# Patient Record
Sex: Female | Born: 1995 | Race: Black or African American | Hispanic: No | Marital: Single | Smoking: Former smoker
Health system: Southern US, Community
[De-identification: ages and names within clinical notes are randomized; demographics above are authoritative.]

## PROBLEM LIST (undated history)

## (undated) ENCOUNTER — Inpatient Hospital Stay: Payer: Self-pay

## (undated) ENCOUNTER — Inpatient Hospital Stay (HOSPITAL_COMMUNITY): Payer: Self-pay

## (undated) DIAGNOSIS — D649 Anemia, unspecified: Secondary | ICD-10-CM

## (undated) DIAGNOSIS — Z8659 Personal history of other mental and behavioral disorders: Secondary | ICD-10-CM

## (undated) DIAGNOSIS — Z87440 Personal history of urinary (tract) infections: Secondary | ICD-10-CM

## (undated) DIAGNOSIS — F419 Anxiety disorder, unspecified: Secondary | ICD-10-CM

## (undated) DIAGNOSIS — Z789 Other specified health status: Secondary | ICD-10-CM

## (undated) DIAGNOSIS — F32A Depression, unspecified: Secondary | ICD-10-CM

## (undated) DIAGNOSIS — Z862 Personal history of diseases of the blood and blood-forming organs and certain disorders involving the immune mechanism: Secondary | ICD-10-CM

## (undated) DIAGNOSIS — K469 Unspecified abdominal hernia without obstruction or gangrene: Secondary | ICD-10-CM

## (undated) HISTORY — DX: Personal history of urinary (tract) infections: Z87.440

## (undated) HISTORY — PX: OTHER SURGICAL HISTORY: SHX169

## (undated) HISTORY — DX: Personal history of diseases of the blood and blood-forming organs and certain disorders involving the immune mechanism: Z86.2

## (undated) HISTORY — DX: Personal history of other mental and behavioral disorders: Z86.59

## (undated) HISTORY — PX: WISDOM TOOTH EXTRACTION: SHX21

## (undated) HISTORY — DX: Unspecified abdominal hernia without obstruction or gangrene: K46.9

## (undated) NOTE — *Deleted (*Deleted)
Consult Note   SERVICE: Gynecology ***  Patient Name: Andrea Weiss Patient MRN:   [id number]  CC: ***  HPI: Andrea Weiss is a 31 y.o. [id number] with ***   Review of Systems: positives in bold GEN:   fevers, chills, weight changes, appetite changes, fatigue, night sweats HEENT:  HA, vision changes, hearing loss, congestion, rhinorrhea, sinus pressure, dysphagia CV:   CP, palpitations PULM:  SOB, cough GI:  abd pain, N/V/D/C GU:  dysuria, urgency, frequency MSK:  arthralgias, myalgias, back pain, swelling SKIN:  rashes, color changes, pallor NEURO:  numbness, weakness, tingling, seizures, dizziness, tremors PSYCH:  depression, anxiety, behavioral problems, confusion  HEME/LYMPH:  easy bruising or bleeding ENDO:  heat/cold intolerance  Past Obstetrical History: OB History    Gravida  3   Para  1   Term  1   Preterm  0   AB  1   Living  1     SAB  1   TAB  0   Ectopic  0   Multiple  0   Live Births  1           Past Gynecologic History: Patient's last menstrual period was 04/27/2020 (exact date). Menstrual frequency Q *** wks lasting *** days requiring *** pads/day,  *** for night time symptoms  Past Medical History: Past Medical History:  Diagnosis Date  . Abdominal hernia   . History of anemia   . History of pica   . History of urinary tract infection   . Medical history non-contributory     Past Surgical History:   Past Surgical History:  Procedure Laterality Date  . Nexplanon insertion    . WISDOM TOOTH EXTRACTION Bilateral     Family History:  family history is not on file.  Social History:  Social History   Socioeconomic History  . Marital status: Single    Spouse name: Not on file  . Number of children: Not on file  . Years of education: Not on file  . Highest education level: Not on file  Occupational History  . Not on file  Tobacco Use  . Smoking status: Former Smoker    Packs/day: 0.25    Types:  Cigarettes  . Smokeless tobacco: Never Used  Vaping Use  . Vaping Use: Former  . Quit date: 12/28/2019  Substance and Sexual Activity  . Alcohol use: Not Currently    Comment: quit when she found out she was pregnant, was occassioanl  . Drug use: Not Currently    Types: Marijuana    Comment: quit when she found out she was pregnant  . Sexual activity: Yes    Birth control/protection: None  Other Topics Concern  . Not on file  Social History Narrative  . Not on file   Social Determinants of Health   Financial Resource Strain:   . Difficulty of Paying Living Expenses: Not on file  Food Insecurity:   . Worried About Programme researcher, broadcasting/film/video in the Last Year: Not on file  . Ran Out of Food in the Last Year: Not on file  Transportation Needs:   . Lack of Transportation (Medical): Not on file  . Lack of Transportation (Non-Medical): Not on file  Physical Activity:   . Days of Exercise per Week: Not on file  . Minutes of Exercise per Session: Not on file  Stress:   . Feeling of Stress : Not on file  Social Connections:   . Frequency of Communication with  Friends and Family: Not on file  . Frequency of Social Gatherings with Friends and Family: Not on file  . Attends Religious Services: Not on file  . Active Member of Clubs or Organizations: Not on file  . Attends Banker Meetings: Not on file  . Marital Status: Not on file  Intimate Partner Violence: Not At Risk  . Fear of Current or Ex-Partner: No  . Emotionally Abused: No  . Physically Abused: No  . Sexually Abused: No    Home Medications:  Medications reconciled in EPIC  No current facility-administered medications on file prior to encounter.   Current Outpatient Medications on File Prior to Encounter  Medication Sig Dispense Refill  . acetaminophen (TYLENOL) 500 MG tablet Take 1,000 mg by mouth every 6 (six) hours as needed for mild pain or headache.    . cetirizine (ZYRTEC) 10 MG tablet Take 1 tablet (10 mg  total) by mouth daily. (Patient not taking: Reported on 08/07/2019) 14 tablet 2  . HYDROcodone-acetaminophen (NORCO) 5-325 MG tablet Take 1 tablet by mouth every 4 (four) hours as needed for moderate pain. (Patient not taking: Reported on 06/11/2020) 6 tablet 0  . hydrocortisone cream 0.5 % Apply 1 application topically 2 (two) times daily. (Patient not taking: Reported on 03/14/2018) 30 g 0  . ibuprofen (ADVIL,MOTRIN) 600 MG tablet Take 1 tablet (600 mg total) by mouth every 6 (six) hours. (Patient not taking: Reported on 12/27/2017) 30 tablet 0  . Prenatal Vit-Fe Fumarate-FA (MULTIVITAMIN-PRENATAL) 27-0.8 MG TABS tablet Take 2 tablets by mouth daily at 12 noon. 2 gummies per day      Allergies:  Allergies  Allergen Reactions  . Mango Flavor Hives and Swelling    Allergy to mangos    Physical Exam:  Temp:  [98.8 F (37.1 C)] 98.8 F (37.1 C) (10/27 1244) Pulse Rate:  [83] 83 (10/27 1400) Resp:  [22-26] 22 (10/27 1400) BP: (125-126)/(61-69) 126/69 (10/27 1400) SpO2:  [100 %] 100 % (10/27 1400) Weight:  [54.4 kg] 54.4 kg (10/27 1244)   General Appearance:  Well developed, well nourished, no acute distress, alert and oriented, cooperative and appears stated age HEENT:  Normocephalic atraumatic, extraocular movements intact, moist mucous membranes, neck supple with midline trachea and thyroid without masses Cardiovascular:  Normal S1/S2, regular rate and rhythm, no murmurs, 2+ distal pulses Pulmonary:  clear to auscultation, no wheezes, rales or rhonchi, symmetric air entry, good air exchange Abdomen:  Bowel sounds present, soft, nontender, nondistended, no abnormal masses or organomegaly, no epigastric pain Back: inspection of back is normal Extremities:  extremities normal, no tenderness, atraumatic, no cyanosis or edema Skin:  normal coloration and turgor, no rashes, no suspicious skin lesions noted  Neurologic:  Cranial nerves 2-12 grossly intact, grossly equal strength and muscle tone,  normal speech, no focal findings or movement disorder noted. Psychiatric:  Normal mood and affect, appropriate, no AH/VH Pelvic:  NEFG, no vulvar masses or lesions, normal vaginal mucosa, no vaginal bleeding or discharge, cervix without lesions or erythema, ***uterus, no adnexal masses appreciated, *** no palpable nodularity on rectovaginal exam, no pelvic organ prolapse,    Labs/Studies:   CBC and Coags:  Lab Results  Component Value Date   WBC 7.6 09/16/2020   NEUTOPHILPCT 62 09/16/2020   EOSPCT 2 09/16/2020   BASOPCT 0 09/16/2020   LYMPHOPCT 29 09/16/2020   HGB 11.3 (L) 09/16/2020   HCT 34.5 (L) 09/16/2020   MCV 83.7 09/16/2020   PLT 147 (L) 09/16/2020  INR 0.9 09/16/2020   CMP:  Lab Results  Component Value Date   NA 135 09/16/2020   K 3.6 09/16/2020   CL 103 09/16/2020   CO2 23 09/16/2020   BUN 8 09/16/2020   CREATININE 0.35 (L) 09/16/2020   CREATININE 0.56 03/28/2020   CREATININE 0.60 06/04/2015   PROT 6.9 09/16/2020   BILITOT 0.8 09/16/2020   ALT 12 09/16/2020   AST 21 09/16/2020   ALKPHOS 47 09/16/2020   Other Labs: ***  TVUS:  *** Other Imaging: US OB Limited  Result Date: 09/16/2020 CLINICAL DATA:  Lower abdominal pain for 2 days EXAM: LIMITED OBSTETRIC ULTRASOUND FINDINGS: Number of Fetuses: 1 Heart Rate:  145 bpm Movement: Yes Presentation: Cephalic Placental Location: Anterior Previa: No Amniotic Fluid (Subjective):  Within normal limits. BPD: 4.3 cm 19 w  1 d MATERNAL FINDINGS: Cervix:  3.1 cm length.  Appears closed. Uterus/Adnexae: No abnormality visualized. IMPRESSION: Single live intrauterine gestation in cephalic presentation measuring 19 weeks 1 day by BPD. This exam is performed on an emergent basis and does not comprehensively evaluate fetal size, dating, or anatomy; follow-up complete OB US should be considered if further fetal assessment is warranted. Electronically Signed   By: Duanne Guess D.O.   On: 09/16/2020 14:00     Assessment /  Plan:   Andrea Weiss is a 21 y.o. G3P1011 who presents with ***  1. ***   Thank you for the opportunity to be involved with this patient's care.

---

## 2015-06-04 ENCOUNTER — Emergency Department (HOSPITAL_COMMUNITY): Payer: Self-pay

## 2015-06-04 ENCOUNTER — Encounter (HOSPITAL_COMMUNITY): Payer: Self-pay | Admitting: Neurology

## 2015-06-04 ENCOUNTER — Emergency Department (HOSPITAL_COMMUNITY)
Admission: EM | Admit: 2015-06-04 | Discharge: 2015-06-04 | Disposition: A | Discharge status: Home / Self Care | Payer: Self-pay | Attending: Emergency Medicine | Admitting: Emergency Medicine

## 2015-06-04 DIAGNOSIS — R110 Nausea: Secondary | ICD-10-CM | POA: Insufficient documentation

## 2015-06-04 DIAGNOSIS — R0789 Other chest pain: Secondary | ICD-10-CM

## 2015-06-04 DIAGNOSIS — R05 Cough: Secondary | ICD-10-CM | POA: Insufficient documentation

## 2015-06-04 DIAGNOSIS — Z720 Tobacco use: Secondary | ICD-10-CM | POA: Insufficient documentation

## 2015-06-04 DIAGNOSIS — R079 Chest pain, unspecified: Secondary | ICD-10-CM | POA: Insufficient documentation

## 2015-06-04 LAB — I-STAT BETA HCG BLOOD, ED (MC, WL, AP ONLY)

## 2015-06-04 LAB — I-STAT CHEM 8, ED
BUN: 13 mg/dL (ref 6–20)
CHLORIDE: 99 mmol/L — AB (ref 101–111)
Calcium, Ion: 1.2 mmol/L (ref 1.12–1.23)
Creatinine, Ser: 0.6 mg/dL (ref 0.44–1.00)
GLUCOSE: 91 mg/dL (ref 65–99)
HEMATOCRIT: 41 % (ref 36.0–46.0)
Hemoglobin: 13.9 g/dL (ref 12.0–15.0)
POTASSIUM: 3.7 mmol/L (ref 3.5–5.1)
Sodium: 137 mmol/L (ref 135–145)
TCO2: 22 mmol/L (ref 0–100)

## 2015-06-04 MED ORDER — AEROCHAMBER PLUS W/MASK MISC
1.0000 | Freq: Once | Status: AC
  Administered 2015-06-04: 1
  Filled 2015-06-04: qty 1

## 2015-06-04 MED ORDER — IPRATROPIUM-ALBUTEROL 0.5-2.5 (3) MG/3ML IN SOLN
3.0000 mL | RESPIRATORY_TRACT | Status: DC
  Administered 2015-06-04: 3 mL via RESPIRATORY_TRACT
  Filled 2015-06-04: qty 3

## 2015-06-04 MED ORDER — ALBUTEROL SULFATE HFA 108 (90 BASE) MCG/ACT IN AERS
2.0000 | INHALATION_SPRAY | Freq: Once | RESPIRATORY_TRACT | Status: AC
  Administered 2015-06-04: 2 via RESPIRATORY_TRACT
  Filled 2015-06-04: qty 6.7

## 2015-06-04 NOTE — ED Provider Notes (Signed)
CSN: [id number]     Arrival date & time 06/04/15  1358 History  This chart was scribed for Joycie Peek, PA-C, working with Toy Cookey, MD by Chestine Spore, ED Scribe. The patient was seen in room TR07C/TR07C at 4:24 PM.    Chief Complaint  Patient presents with  . Chest Pain      The history is provided by the patient. No language interpreter was used.    HPI Comments: Andrea Weiss is a 19 y.o. female who presents to the Emergency Department complaining of CP onset 1 week. Pt denies a cardiac hx blood clot, asthma, recent travel surgeries or taking birth control. Pt notes that nothing seems to make the pain better. Pt reports that her CP happens intermittently at work only. Pt has chest tightness that has ben occurring for 10.5 hours today that she rates as 7/10. She states that she is having associated symptoms of cough. She denies vomiting, abdominal pain, fever, hemoptysis, SOB, vomiting, nausea, leg swelling, and any other symptoms.   History reviewed. No pertinent past medical history. History reviewed. No pertinent past surgical history. No family history on file. History  Substance Use Topics  . Smoking status: Current Every Day Smoker  . Smokeless tobacco: Not on file  . Alcohol Use: No   OB History    No data available     Review of Systems  Respiratory: Positive for cough. Negative for shortness of breath.   Cardiovascular: Positive for chest pain. Negative for leg swelling.  Gastrointestinal: Positive for nausea. Negative for vomiting and abdominal pain.  All other systems reviewed and are negative.     Allergies  Review of patient's allergies indicates no known allergies.  Home Medications   Prior to Admission medications   Not on File   BP 112/67 mmHg  Pulse 78  Temp(Src) 98.2 F (36.8 C) (Oral)  Resp 19  SpO2 100%  LMP 04/22/2015 Physical Exam  Constitutional: She is oriented to person, place, and time. She appears well-developed and  well-nourished. No distress.  HENT:  Head: Normocephalic and atraumatic.  Eyes: EOM are normal.  Neck: Neck supple. No tracheal deviation present.  Cardiovascular: Normal rate, regular rhythm and normal heart sounds.  Exam reveals no gallop and no friction rub.   No murmur heard. Pulmonary/Chest: Effort normal and breath sounds normal. No respiratory distress. She has no wheezes. She has no rales.  Abdominal: Soft. There is no tenderness.  Musculoskeletal: Normal range of motion.  Neurological: She is alert and oriented to person, place, and time.  Skin: Skin is warm and dry.  Psychiatric: She has a normal mood and affect. Her behavior is normal.  Nursing note and vitals reviewed.   ED Course  Procedures (including critical care time) DIAGNOSTIC STUDIES: Oxygen Saturation is 99% on RA, nl by my interpretation.    COORDINATION OF CARE: 4:25 PM-Discussed treatment plan with pt at bedside and pt agreed to plan.   Labs Review Labs Reviewed  I-STAT CHEM 8, ED - Abnormal; Notable for the following:    Chloride 99 (*)    All other components within normal limits  I-STAT BETA HCG BLOOD, ED ([facility], [facility], [facility] ONLY)    Imaging Review Dg Chest 2 View  06/04/2015   CLINICAL DATA:  Chest pain  EXAM: CHEST  2 VIEW  COMPARISON:  None.  FINDINGS: The heart size and mediastinal contours are within normal limits. Both lungs are clear. The visualized skeletal structures are unremarkable.  IMPRESSION: No active  cardiopulmonary disease.   Electronically Signed   By: Natasha MeadLiviu  Pop M.D.   On: 06/04/2015 14:58     EKG Interpretation None      ED ECG REPORT   Date: 06/04/2015  Rate: 91  Rhythm: normal sinus rhythm  QRS Axis: normal  Intervals: normal  ST/T Wave abnormalities: normal  Conduction Disutrbances:none  Narrative Interpretation:   Old EKG Reviewed: none available  I have personally reviewed the EKG tracing and agree with the computerized printout as noted.  Meds given in  ED:  Medications  ipratropium-albuterol (DUONEB) 0.5-2.5 (3) MG/3ML nebulizer solution 3 mL (3 mLs Nebulization Given 06/04/15 1647)  aerochamber plus with mask device 1 each (1 each Other Given 06/04/15 1749)  albuterol (PROVENTIL HFA;VENTOLIN HFA) 108 (90 BASE) MCG/ACT inhaler 2 puff (2 puffs Inhalation Given 06/04/15 1749)    There are no discharge medications for this patient.  Filed Vitals:   06/04/15 1418 06/04/15 1649 06/04/15 1751  BP: 109/66 102/65 112/67  Pulse: 94 73 78  Temp: 99 F (37.2 C) 98.2 F (36.8 C)   TempSrc: Oral Oral   Resp: 14 18 19   SpO2: 99% 100% 100%    MDM  Vitals stable - WNL -afebrile Pt resting comfortably in ED. reports she feels much better after DuoNeb. PE--Lung exam normal. Cardiac auscultation reveals no murmurs rubs or gallops. Grossly Benign Physical Exam Labwork: IEKG reassuring.  Labs otherwise noncontributory Imaging: CXR shows no acute cardiopulmonary pathology. I personally reviewed the imaging and agree with the results as interpreted by the radiologist.  DDX: Patient with chest discomfort likely due to bronchospasm as this only happens when she is at work around environmental irritants. Clinical picture and exam today not consistent with ACS/dissection.  No evidence of spontaneous pneumothorax, esophageal rupture or other mediastinitis. PERC negative, doubt PE. No evidence of myocarditis, endocarditis, pericarditis.  Will DC with albuterol inhaler with AeroChamber. I discussed all relevant lab findings and imaging results with pt and they verbalized understanding. Discussed f/u with PCP within 48 hrs and return precautions, pt very amenable to plan.   Final diagnoses:  Chest discomfort  I personally performed the services described in this documentation, which was scribed in my presence. The recorded information has been reviewed and is accurate.    Joycie PeekBenjamin Bernardo Brayman, PA-C 06/04/15 [phone]  Toy CookeyMegan Docherty, MD 06/06/15 [phone]

## 2015-06-04 NOTE — Discharge Instructions (Signed)
You were evaluated for your chest pain in the ED. There does not appear to be emergent cause for her symptoms at this time. Her exam, labs, EKG and chest x-ray were all reassuring. Please use your inhaler and spacer as directed. Follow-up with primary care/[place] and wellness for further evaluation and management of your symptoms. Return to ED for worsening symptoms.

## 2015-06-04 NOTE — ED Notes (Signed)
Pt c/o cp for 1 week, feels tight in chest. Denies vomiting. Denies cardiac hx. Pt is a x 4. Skin warm and dry.

## 2015-07-27 ENCOUNTER — Encounter (HOSPITAL_COMMUNITY): Payer: Self-pay | Admitting: Vascular Surgery

## 2015-07-27 ENCOUNTER — Emergency Department (HOSPITAL_COMMUNITY)
Admission: EM | Admit: 2015-07-27 | Discharge: 2015-07-27 | Disposition: A | Discharge status: Home / Self Care | Payer: Self-pay | Attending: Emergency Medicine | Admitting: Emergency Medicine

## 2015-07-27 ENCOUNTER — Emergency Department (HOSPITAL_COMMUNITY): Payer: Self-pay

## 2015-07-27 DIAGNOSIS — Z720 Tobacco use: Secondary | ICD-10-CM | POA: Insufficient documentation

## 2015-07-27 DIAGNOSIS — J069 Acute upper respiratory infection, unspecified: Secondary | ICD-10-CM | POA: Insufficient documentation

## 2015-07-27 MED ORDER — PSEUDOEPHEDRINE HCL ER 120 MG PO TB12
120.0000 mg | ORAL_TABLET | ORAL | Status: AC
  Administered 2015-07-27: 120 mg via ORAL
  Filled 2015-07-27: qty 1

## 2015-07-27 MED ORDER — IBUPROFEN 400 MG PO TABS
800.0000 mg | ORAL_TABLET | Freq: Once | ORAL | Status: AC
  Administered 2015-07-27: 800 mg via ORAL
  Filled 2015-07-27: qty 4

## 2015-07-27 MED ORDER — HYDROCODONE-HOMATROPINE 5-1.5 MG/5ML PO SYRP: 5.0000 mL | ORAL_SOLUTION | Freq: Four times a day (QID) | ORAL | Status: DC | PRN

## 2015-07-27 NOTE — ED Notes (Signed)
Pt reports to the ED for eval of HA, nasal congestion and drainage, and productive yellow/blood streaked cough. She describes her nasal drainage as the same. Symptoms began yesterday. Pt also reports chills, unsure fever. Pt denies any hx of asthma. Pt A&Ox4, resp e/u, and skin warm and dry.

## 2015-07-27 NOTE — Discharge Instructions (Signed)
Read the information below.  Use the prescribed medication as directed.  Please discuss all new medications with your pharmacist.  You may return to the Emergency Department at any time for worsening condition or any new symptoms that concern you.  If you develop high fevers that do not resolve with tylenol or ibuprofen, you have difficulty swallowing or breathing, or you are unable to tolerate fluids by mouth, return to the ER for a recheck.    ° ° °Upper Respiratory Infection, Adult °An upper respiratory infection (URI) is also sometimes known as the common cold. The upper respiratory tract includes the nose, sinuses, throat, trachea, and bronchi. Bronchi are the airways leading to the lungs. Most people improve within 1 week, but symptoms can last up to 2 weeks. A residual cough may last even longer.  °CAUSES °Many different viruses can infect the tissues lining the upper respiratory tract. The tissues become irritated and inflamed and often become very moist. Mucus production is also common. A cold is contagious. You can easily spread the virus to others by oral contact. This includes kissing, sharing a glass, coughing, or sneezing. Touching your mouth or nose and then touching a surface, which is then touched by another person, can also spread the virus. °SYMPTOMS  °Symptoms typically develop 1 to 3 days after you come in contact with a cold virus. Symptoms vary from person to person. They may include: °· Runny nose. °· Sneezing. °· Nasal congestion. °· Sinus irritation. °· Sore throat. °· Loss of voice (laryngitis). °· Cough. °· Fatigue. °· Muscle aches. °· Loss of appetite. °· Headache. °· Low-grade fever. °DIAGNOSIS  °You might diagnose your own cold based on familiar symptoms, since most people get a cold 2 to 3 times a year. Your caregiver can confirm this based on your exam. Most importantly, your caregiver can check that your symptoms are not due to another disease such as strep throat, sinusitis,  pneumonia, asthma, or epiglottitis. Blood tests, throat tests, and X-rays are not necessary to diagnose a common cold, but they may sometimes be helpful in excluding other more serious diseases. Your caregiver will decide if any further tests are required. °RISKS AND COMPLICATIONS  °You may be at risk for a more severe case of the common cold if you smoke cigarettes, have chronic heart disease (such as heart failure) or lung disease (such as asthma), or if you have a weakened immune system. The very young and very old are also at risk for more serious infections. Bacterial sinusitis, middle ear infections, and bacterial pneumonia can complicate the common cold. The common cold can worsen asthma and chronic obstructive pulmonary disease (COPD). Sometimes, these complications can require emergency medical care and may be life-threatening. °PREVENTION  °The best way to protect against getting a cold is to practice good hygiene. Avoid oral or hand contact with people with cold symptoms. Wash your hands often if contact occurs. There is no clear evidence that vitamin C, vitamin E, echinacea, or exercise reduces the chance of developing a cold. However, it is always recommended to get plenty of rest and practice good nutrition. °TREATMENT  °Treatment is directed at relieving symptoms. There is no cure. Antibiotics are not effective, because the infection is caused by a virus, not by bacteria. Treatment may include: °· Increased fluid intake. Sports drinks offer valuable electrolytes, sugars, and fluids. °· Breathing heated mist or steam (vaporizer or shower). °· Eating chicken soup or other clear broths, and maintaining good nutrition. °· Getting plenty   of rest. °· Using gargles or lozenges for comfort. °· Controlling fevers with ibuprofen or acetaminophen as directed by your caregiver. °· Increasing usage of your inhaler if you have asthma. °Zinc gel and zinc lozenges, taken in the first 24 hours of the common cold, can  shorten the duration and lessen the severity of symptoms. Pain medicines may help with fever, muscle aches, and throat pain. A variety of non-prescription medicines are available to treat congestion and runny nose. Your caregiver can make recommendations and may suggest nasal or lung inhalers for other symptoms.  °HOME CARE INSTRUCTIONS  °· Only take over-the-counter or prescription medicines for pain, discomfort, or fever as directed by your caregiver. °· Use a warm mist humidifier or inhale steam from a shower to increase air moisture. This may keep secretions moist and make it easier to breathe. °· Drink enough water and fluids to keep your urine clear or pale yellow. °· Rest as needed. °· Return to work when your temperature has returned to normal or as your caregiver advises. You may need to stay home longer to avoid infecting others. You can also use a face mask and careful hand washing to prevent spread of the virus. °SEEK MEDICAL CARE IF:  °· After the first few days, you feel you are getting worse rather than better. °· You need your caregiver's advice about medicines to control symptoms. °· You develop chills, worsening shortness of breath, or brown or red sputum. These may be signs of pneumonia. °· You develop yellow or brown nasal discharge or pain in the face, especially when you bend forward. These may be signs of sinusitis. °· You develop a fever, swollen neck glands, pain with swallowing, or white areas in the back of your throat. These may be signs of strep throat. °SEEK IMMEDIATE MEDICAL CARE IF:  °· You have a fever. °· You develop severe or persistent headache, ear pain, sinus pain, or chest pain. °· You develop wheezing, a prolonged cough, cough up blood, or have a change in your usual mucus (if you have chronic lung disease). °· You develop sore muscles or a stiff neck. °Document Released: 05/03/2001 Document Revised: 01/30/2012 Document Reviewed: 02/12/2014 °[employer]® Patient Information ©2015  [employer]. This information is not intended to replace advice given to you by your health care provider. Make sure you discuss any questions you have with your health care provider. ° °

## 2015-07-27 NOTE — ED Notes (Signed)
Patient returned from [facility]. 

## 2015-07-27 NOTE — ED Notes (Signed)
PA at the bedside.

## 2015-07-27 NOTE — ED Notes (Signed)
Patient transported to X-ray 

## 2015-07-27 NOTE — ED Provider Notes (Signed)
CSN: [id number]     Arrival date & time 07/27/15  2116 History  This chart was scribed for non-physician practitioner working [name] PA-C with Lavera Guise, MD by Lyndel Safe, ED Scribe. This patient was seen in room TR03C/TR03C and the patient's care was started at 10:04 PM.  Chief Complaint  Patient presents with  . URI  . Headache   The history is provided by the patient. No language interpreter was used.   HPI Comments:  Andrea Weiss is a 19 y.o. female, with no chronic medical conditions, who presents to the Emergency Department complaining of constant URI symptoms onset 1 day ago that progressively worsened today. The pt reports URI symptoms of a headache, nasal congestion, rhinorrhea that she describes as a mix of mucous with red streaks, a productive cough with yellow and blood streaked sputum, and chills. She additionally reports an episode of posttussive emesis but denies nausea and vomiting.  Pt has taken an OTC cold medication with no relief. She denies SOB, fevers, malaise, abdominal pain, vomiting, diarrhea, any urinary symptoms, abnormal vaginal discharge or chance of pregnancy.   History reviewed. No pertinent past medical history. History reviewed. No pertinent past surgical history. No family history on file. Social History  Substance Use Topics  . Smoking status: Current Every Day Smoker -- 0.50 packs/day    Types: Cigarettes  . Smokeless tobacco: Never Used  . Alcohol Use: No   OB History    No data available     Review of Systems  Constitutional: Positive for chills. Negative for fever.  HENT: Positive for congestion and rhinorrhea.   Respiratory: Positive for cough. Negative for shortness of breath.   Gastrointestinal: Negative for nausea, vomiting, abdominal pain and diarrhea.  Genitourinary: Negative for dysuria, urgency, frequency and vaginal discharge.  Neurological: Positive for headaches.  All other systems reviewed and are  negative.  Allergies  Review of patient's allergies indicates no known allergies.  Home Medications   Prior to Admission medications   Not on File   BP 103/59 mmHg  Pulse 96  Temp(Src) 98.5 F (36.9 C) (Oral)  Resp 16  Wt 100 lb 8 oz (45.587 kg)  SpO2 99%  LMP 07/22/2015 Physical Exam  Constitutional: She appears well-developed and well-nourished. No distress.  HENT:  Head: Normocephalic and atraumatic.  Nose: Mucosal edema and rhinorrhea present. Right sinus exhibits maxillary sinus tenderness. Right sinus exhibits no frontal sinus tenderness. Left sinus exhibits maxillary sinus tenderness. Left sinus exhibits no frontal sinus tenderness.  Mouth/Throat: Uvula is midline. Mucous membranes are not dry. Posterior oropharyngeal erythema present. No oropharyngeal exudate, posterior oropharyngeal edema or tonsillar abscesses.  Eyes: Conjunctivae are normal.  Neck: Normal range of motion. Neck supple.  Cardiovascular: Normal rate and regular rhythm.   Pulmonary/Chest: Effort normal and breath sounds normal. No respiratory distress. She has no wheezes. She has no rales.  Neurological: She is alert.  Skin: She is not diaphoretic.  Nursing note and vitals reviewed.   ED Course  Procedures  DIAGNOSTIC STUDIES: Oxygen Saturation is 99% on RA, normal by my interpretation.    COORDINATION OF CARE: 10:10 PM Discussed treatment plan which includes to order chest Xray with pt. Pt acknowledges and agrees to plan.   Labs Review Labs Reviewed - No data to display  Imaging Review No results found.    EKG Interpretation None      MDM   Final diagnoses:  URI (upper respiratory infection)    Afebrile, nontoxic patient with  constellation of symptoms suggestive of viral syndrome.  No concerning findings on exam.  CXR ordered given blood streaked sputum, though pt is a smoker.   CXR pending at end of shift.  Signed out to [employer], PA-C, who assumes care of patient pending  CXR.  Anticipate d/c home with supportive medications, PCP follow up.     I personally performed the services described in this documentation, which was scribed in my presence. The recorded information has been reviewed and is accurate.    [name], PA-C 07/27/15 2259  [name], PA-C 07/27/15 2259  Lavera Guise, MD 07/28/15 [phone]

## 2016-11-21 NOTE — L&D Delivery Note (Signed)
Operative Delivery Note At 10:59 AM a viable female was delivered via Vaginal, Vacuum Investment banker, operational(Extractor).  Position: Occiput,, Anterior; Station: +4.   Called to room for persistent FHR in 90s with ineffective pushing. Fetal head at perineum. Patient pushed ineffectively with two pushes with no improvement in FHR. Reviewed option for vacuum assisted vaginal delivery to expedite delivery for low fetal heart rate. Risks include, but are not limited to the risks of anesthesia, bleeding, infection, damage to maternal tissues, fetal cephalhematoma.  There is also the risk of inability to effect vaginal delivery of the head, or shoulder dystocia that cannot be resolved by established maneuvers, leading to the need for emergency cesarean section. Patient verbalized consent and Kiwi vacuum applied 2 cm anterior to the posterior fontanelle, equidistant across the sagittal suture. Vacuum pressure was applied and gentle traction applied while patient pushed for approx 10 seconds. Head delivered easily in OA position and pressure from [facility]. No popoffs. No nuchal cord noted. Left shoulder rotated anteriorly to facilitate delivery as shoulders were a tight fit at introitus. Infant delivered easily with moderate tone. No cry, infant bulb suctioned and cord clamping done at approx 45 seconds. Infant delivered to warmer for waiting pediatrics staff, with good cry after cord cut. Apgars 5/9, weight pending. Cord section obtained and cord blood obtained. Placenta delivered spontaneously with pitocin and massage. Fundus firm. Bilateral periurethral abrasions noted requiring no repair. One figure of 8 placed with 3-0 vicryl in right vaginal wall for oozing with good hemostasis noted. EBL 200 mL.  Mom to postpartum.  Baby to Couplet care / Skin to Skin.    Baldemar LenisK. Meryl Davis, M.D. 11/17/2017, 12:11 PM Attending Obstetrician & Gynecologist, [facility], [facility]

## 2017-04-19 ENCOUNTER — Other Ambulatory Visit: Payer: Self-pay | Admitting: Obstetrics & Gynecology

## 2017-04-19 DIAGNOSIS — Z363 Encounter for antenatal screening for malformations: Secondary | ICD-10-CM

## 2017-04-19 LAB — OB RESULTS CONSOLE HEPATITIS B SURFACE ANTIGEN: Hepatitis B Surface Ag: NEGATIVE

## 2017-04-19 LAB — OB RESULTS CONSOLE HIV ANTIBODY (ROUTINE TESTING): HIV: NONREACTIVE

## 2017-04-19 LAB — OB RESULTS CONSOLE RUBELLA ANTIBODY, IGM: RUBELLA: IMMUNE

## 2017-04-19 LAB — OB RESULTS CONSOLE RPR: RPR: NONREACTIVE

## 2017-04-19 LAB — OB RESULTS CONSOLE ABO/RH: RH TYPE: POSITIVE

## 2017-04-21 ENCOUNTER — Other Ambulatory Visit: Payer: Self-pay | Admitting: Obstetrics & Gynecology

## 2017-04-21 ENCOUNTER — Ambulatory Visit (INDEPENDENT_AMBULATORY_CARE_PROVIDER_SITE_OTHER): Payer: Medicaid Other

## 2017-04-21 DIAGNOSIS — Z363 Encounter for antenatal screening for malformations: Secondary | ICD-10-CM

## 2017-04-21 DIAGNOSIS — Z3689 Encounter for other specified antenatal screening: Secondary | ICD-10-CM | POA: Diagnosis not present

## 2017-06-20 ENCOUNTER — Other Ambulatory Visit: Payer: Medicaid Other

## 2017-06-26 ENCOUNTER — Other Ambulatory Visit: Payer: Self-pay | Admitting: Obstetrics & Gynecology

## 2017-06-26 DIAGNOSIS — Z363 Encounter for antenatal screening for malformations: Secondary | ICD-10-CM

## 2017-06-27 ENCOUNTER — Ambulatory Visit (INDEPENDENT_AMBULATORY_CARE_PROVIDER_SITE_OTHER): Payer: Medicaid Other

## 2017-06-27 DIAGNOSIS — Z363 Encounter for antenatal screening for malformations: Secondary | ICD-10-CM | POA: Diagnosis not present

## 2017-07-22 ENCOUNTER — Observation Stay
Admission: EM | Admit: 2017-07-22 | Discharge: 2017-07-22 | Disposition: A | Discharge status: Home / Self Care | Payer: Medicaid Other | Attending: Advanced Practice Midwife | Admitting: Advanced Practice Midwife

## 2017-07-22 ENCOUNTER — Encounter: Payer: Self-pay | Admitting: *Deleted

## 2017-07-22 DIAGNOSIS — Z91018 Allergy to other foods: Secondary | ICD-10-CM | POA: Diagnosis not present

## 2017-07-22 DIAGNOSIS — Z87891 Personal history of nicotine dependence: Secondary | ICD-10-CM | POA: Diagnosis not present

## 2017-07-22 DIAGNOSIS — O26892 Other specified pregnancy related conditions, second trimester: Secondary | ICD-10-CM | POA: Diagnosis not present

## 2017-07-22 DIAGNOSIS — M545 Low back pain: Secondary | ICD-10-CM

## 2017-07-22 DIAGNOSIS — Z3A23 23 weeks gestation of pregnancy: Secondary | ICD-10-CM | POA: Diagnosis not present

## 2017-07-22 HISTORY — DX: Other specified health status: Z78.9

## 2017-07-22 LAB — URINALYSIS, ROUTINE W REFLEX MICROSCOPIC
Bilirubin Urine: NEGATIVE
Glucose, UA: NEGATIVE mg/dL
Hgb urine dipstick: NEGATIVE
Ketones, ur: 5 mg/dL — AB
LEUKOCYTES UA: NEGATIVE
NITRITE: NEGATIVE
PROTEIN: NEGATIVE mg/dL
Specific Gravity, Urine: 1.02 (ref 1.005–1.030)
pH: 6 (ref 5.0–8.0)

## 2017-07-22 NOTE — OB Triage Note (Signed)
Reports losing mucous plug last night around midnight. Denies LOF, bleeding, or vaginal discharge. Elaina HoopsElks, Briant Angelillo S

## 2017-07-22 NOTE — Discharge Summary (Signed)
Physician Final Progress Note  Patient ID: Andrea Weiss MRN: [id number] DOB/AGE: 21/02/1996 21 y.o.  Admit date: 07/22/2017 Admitting provider: Linzie Collinavid James Evans, MD Discharge date: 07/22/2017   Admission Diagnoses: lost mucous plug, low back pain  Discharge Diagnoses:  Active Problems:   Indication for care in labor or delivery IUP at 23 weeks 0 days, with positive fetal heart tones, not in labor, no evidence of fluid at discharge  History of Present Illness: The patient is a 21 y.o. female G1P0 at 594w0d who presents for having a clump of clear mucous come out following her shower last night. She also started having back pain last night that lasts for 30-60 minutes when it occurs. She admits positive fetal movement. She admits having intercourse on Wednesday and today. She denies contractions, other LOF, VB. She denies symptoms of UTI. She does not always drink enough h2o to keep her urine clear to light yellow. She has questions about back pain and what she can do to relieve it. Labor precautions are reviewed with the patient with verbalized understanding.   Past Medical History:  Diagnosis Date  . Medical history non-contributory     Past Surgical History:  Procedure Laterality Date  . WISDOM TOOTH EXTRACTION Bilateral     No current facility-administered medications on file prior to encounter.    Current Outpatient Prescriptions on File Prior to Encounter  Medication Sig Dispense Refill  . HYDROcodone-homatropine (HYCODAN) 5-1.5 MG/5ML syrup Take 5 mLs by mouth every 6 (six) hours as needed for cough. (Patient not taking: Reported on 07/22/2017) 120 mL 0    Allergies  Allergen Reactions  . Mango Flavor Hives and Swelling    Allergy to [employer]    Social History   Social History  . Marital status: Single    Spouse name: N/A  . Number of children: N/A  . Years of education: N/A   Occupational History  . Not on file.   Social History Main Topics  . Smoking status:  Former Smoker    Packs/day: 0.50    Types: Cigarettes    Quit date: 04/19/2017  . Smokeless tobacco: Never Used  . Alcohol use No  . Drug use: Yes    Types: Marijuana  . Sexual activity: Yes    Birth control/ protection: Condom   Other Topics Concern  . Not on file   Social History Narrative  . No narrative on file    Physical Exam: BP 120/74 (BP Location: Left Arm)   Pulse 95   Temp 98.6 F (37 C) (Oral)   Resp 16   Ht 5\' 4"  (1.626 m)   Wt 110 lb (49.9 kg)   LMP 12/30/2016 (Approximate)   BMI 18.88 kg/m   Gen: NAD CV: RRR Pulm: CTAB Pelvic:  Nitrazine negative, no evidence of fluid leaking on exam Toco: irritability noted Fetal Well Being: 154 on doppler Ext: no evidence of DVT  Consults: None  Significant Findings/ Diagnostic Studies: labs:  Results for Andrea Weiss, Andrea Weiss (MRN [id number]) as of 07/22/2017 17:54  Ref. Range 07/22/2017 17:13  Appearance Latest Ref Range: CLEAR  CLEAR (A)  Bilirubin Urine Latest Ref Range: NEGATIVE  NEGATIVE  Color, Urine Latest Ref Range: YELLOW  YELLOW (A)  Glucose Latest Ref Range: NEGATIVE mg/dL NEGATIVE  Hgb urine dipstick Latest Ref Range: NEGATIVE  NEGATIVE  Ketones, ur Latest Ref Range: NEGATIVE mg/dL 5 (A)  Leukocytes, UA Latest Ref Range: NEGATIVE  NEGATIVE  Nitrite Latest Ref Range: NEGATIVE  NEGATIVE  pH Latest  Ref Range: 5.0 - 8.0  6.0  Protein Latest Ref Range: NEGATIVE mg/dL NEGATIVE  Specific Gravity, Urine Latest Ref Range: 1.005 - 1.030  1.020    Procedures: none   Discharge Condition: good  Disposition: 01-Home or Self Care  Diet: Regular diet  Discharge Activity: Activity as tolerated  Discharge Instructions    Discharge activity:  No Restrictions    Complete by:  As directed    Heat/ice, stretching, abdominal support band, bath/shower for back pain   Discharge diet:  No restrictions    Complete by:  As directed    Increase hydration with h2o until urine is clear to light yellow   No sexual activity  restrictions    Complete by:  As directed    Notify physician for a general feeling that "something is not right"    Complete by:  As directed    Notify physician for increase or change in vaginal discharge    Complete by:  As directed    Notify physician for intestinal cramps, with or without diarrhea, sometimes described as "gas pain"    Complete by:  As directed    Notify physician for leaking of fluid    Complete by:  As directed    Notify physician for low, dull backache, unrelieved by heat or Tylenol    Complete by:  As directed    Notify physician for menstrual like cramps    Complete by:  As directed    Notify physician for pelvic pressure    Complete by:  As directed    Notify physician for uterine contractions.  These may be painless and feel like the uterus is tightening or the baby is  "balling up"    Complete by:  As directed    Notify physician for vaginal bleeding    Complete by:  As directed    PRETERM LABOR:  Includes any of the follwing symptoms that occur between 20 - [redacted] weeks gestation.  If these symptoms are not stopped, preterm labor can result in preterm delivery, placing your baby at risk    Complete by:  As directed      Allergies as of 07/22/2017      Reactions   Mango Flavor Hives, Swelling   Allergy to [facility]      Medication List    STOP taking these medications   HYDROcodone-homatropine 5-1.5 MG/5ML syrup Commonly known as:  HYCODAN     TAKE these medications   multivitamin-prenatal 27-0.8 MG Tabs tablet Take 1 tablet by mouth daily at 12 noon.             Follow-up Information    Department, [facility]. Go to.   Why:  regular scheduled prenatal appointment Contact information: [address] [place] [address] [phone]           Total time spent taking care of this patient: 15 minutes  Signed: Tresea Mall, CNM  07/22/2017, 6:02 PM

## 2017-08-31 LAB — OB RESULTS CONSOLE HIV ANTIBODY (ROUTINE TESTING): HIV: NONREACTIVE

## 2017-09-27 ENCOUNTER — Encounter (HOSPITAL_COMMUNITY): Payer: Self-pay | Admitting: Emergency Medicine

## 2017-09-27 ENCOUNTER — Inpatient Hospital Stay (HOSPITAL_COMMUNITY)
Admission: AD | Admit: 2017-09-27 | Discharge: 2017-09-28 | Disposition: A | Discharge status: Home / Self Care | Payer: Medicaid Other | Source: Ambulatory Visit | Attending: Obstetrics and Gynecology | Admitting: Obstetrics and Gynecology

## 2017-09-27 DIAGNOSIS — O4703 False labor before 37 completed weeks of gestation, third trimester: Secondary | ICD-10-CM | POA: Diagnosis present

## 2017-09-27 DIAGNOSIS — Z3A32 32 weeks gestation of pregnancy: Secondary | ICD-10-CM | POA: Insufficient documentation

## 2017-09-27 DIAGNOSIS — O0933 Supervision of pregnancy with insufficient antenatal care, third trimester: Secondary | ICD-10-CM

## 2017-09-27 DIAGNOSIS — Z87891 Personal history of nicotine dependence: Secondary | ICD-10-CM | POA: Insufficient documentation

## 2017-09-27 MED ORDER — LACTATED RINGERS IV BOLUS (SEPSIS)
1000.0000 mL | Freq: Once | INTRAVENOUS | Status: AC
  Administered 2017-09-27: 1000 mL via INTRAVENOUS

## 2017-09-27 NOTE — ED Provider Notes (Signed)
[facility] Provider Note   CSN: [id number] Arrival date & time: 09/27/17  2052     History   Chief Complaint Chief Complaint  Patient presents with  . Contractions    HPI Ky Arther DamesHargrave is a 21 y.o. female.  HPI   Patient is a 21 year old female that reports that she is [redacted] weeks pregnant presenting today for feelings of contractions.  Patient does not appeared very uncomfortable currently.  Patient came to the wrong hospital, and she did [facility].  Patient reports that she has not monitor her symptoms nor does she know how often she is contracting.  She reports that she has not been seen by physician for the last month because [place] from [place] people here so that she was "too far along" to have an appointment here.  Patient says that she is "been passing her mucous plug" last several days.  Past Medical History:  Diagnosis Date  . Medical history non-contributory     Patient Active Problem List   Diagnosis Date Noted  . Indication for care in labor or delivery 07/22/2017    Past Surgical History:  Procedure Laterality Date  . WISDOM TOOTH EXTRACTION Bilateral     OB History    Gravida Para Term Preterm AB Living   1             SAB TAB Ectopic Multiple Live Births                   Home Medications    Prior to Admission medications   Medication Sig Start Date End Date Taking? Authorizing Provider  Prenatal Vit-Fe Fumarate-FA (MULTIVITAMIN-PRENATAL) 27-0.8 MG TABS tablet Take 1 tablet by mouth daily at 12 noon.    [provider]    Family History No family history on file.  Social History Social History   Tobacco Use  . Smoking status: Former Smoker    Packs/day: 0.50    Types: Cigarettes    Last attempt to quit: 04/19/2017    Years since quitting: 0.4  . Smokeless tobacco: Never Used  Substance Use Topics  . Alcohol use: No  . Drug use: Yes      Types: Marijuana     Allergies   Mango flavor   Review of Systems Review of Systems  Constitutional: Negative for activity change.  Respiratory: Negative for shortness of breath.   Cardiovascular: Negative for chest pain.  Gastrointestinal: Negative for abdominal pain.     Physical Exam Updated Vital Signs BP 106/72 (BP Location: Right Arm)   Pulse 90   Temp 98.1 F (36.7 C) (Oral)   Resp 18   Ht 5\' 2"  (1.575 m)   Wt 49.9 kg (110 lb)   LMP 12/30/2016 (Approximate)   SpO2 100%   BMI 20.12 kg/m   Physical Exam  Constitutional: She is oriented to person, place, and time. She appears well-developed and well-nourished.  HENT:  Head: Normocephalic and atraumatic.  Eyes: Right eye exhibits no discharge. Left eye exhibits no discharge.  Cardiovascular: Normal rate, regular rhythm and normal heart sounds.  No murmur heard. Pulmonary/Chest: Effort normal and breath sounds normal. She has no wheezes. She has no rales.  Abdominal: Soft. She exhibits no distension. There is no tenderness.  Gravid abdomen.  Neurological: She is oriented to person, place, and time.  Skin: Skin is warm and dry. She is not diaphoretic.  Psychiatric:  She has a normal mood and affect.  Nursing note and vitals reviewed.    ED Treatments / Results  Labs (all labs ordered are listed, but only abnormal results are displayed) Labs Reviewed - No data to display  EKG  EKG Interpretation None       Radiology No results found.  Procedures Procedures (including critical care time)  Medications Ordered in ED Medications - No data to display   Initial Impression / Assessment and Plan / ED Course  I have reviewed the triage vital signs and the nursing notes.  Pertinent labs & imaging results that were available during my care of the patient were reviewed by me and considered in my medical decision making (see chart for details).     Patient is a 21 year old female that reports that  she is [redacted] weeks pregnant presenting today for feelings of contractions.  Patient does not appeared very uncomfortable currently.  Patient came to the wrong hospital, and she did not know that [facility] needed [facility].  Patient reports that she has not monitor her symptoms nor does she know how often she is contracting.  She reports that she has not been seen by physician for the last month because she moved from [place] the people here so that she was "too far along" to have an appointment here.  Patient says that she is "been passing her mucous plug" last several days.  10:02 PM Reviewing patient's chart appears that she is actually not due until 27 December.  According to the 6-week ultrasound.  Tthis puts her more like 32 weeks rather than 39 weeks.  Patient contracting every 2-6 minutes.  Patient is at 50% effaced closed.  Discussed with Dr. Emelda FearFerguson.  Will transfer to women's MAU given that she is contracting at 32 weeks.  Final Clinical Impressions(s) / ED Diagnoses   Final diagnoses:  None    ED Discharge Orders    None       Abelino DerrickMackuen, Montserrat Shek Lyn, MD 09/27/17 2337

## 2017-09-27 NOTE — ED Triage Notes (Signed)
Pt is [redacted] wks pregnant, reports onset of contractions earlier this evening. Denies vaginal bleeding, DC, leaking of fluids, etc. Pt appears comfortable in bed

## 2017-09-27 NOTE — Progress Notes (Signed)
Pt is a G1P0, who has received limited prenatal care. [facility] although patients states she was told her due date is 10/06/2017. This puts her at 32wk4da to 4738wk5da. SVE closed/50/-3. FHT 135, moderate variability, multiple accelerations and no decelerations (Cat I), contrations 2-6 minutes and mild.

## 2017-09-28 ENCOUNTER — Encounter (HOSPITAL_COMMUNITY): Payer: Self-pay

## 2017-09-28 DIAGNOSIS — O4703 False labor before 37 completed weeks of gestation, third trimester: Secondary | ICD-10-CM | POA: Diagnosis present

## 2017-09-28 DIAGNOSIS — O0933 Supervision of pregnancy with insufficient antenatal care, third trimester: Secondary | ICD-10-CM

## 2017-09-28 LAB — URINALYSIS, ROUTINE W REFLEX MICROSCOPIC
Bilirubin Urine: NEGATIVE
Glucose, UA: NEGATIVE mg/dL
Hgb urine dipstick: NEGATIVE
KETONES UR: NEGATIVE mg/dL
Leukocytes, UA: NEGATIVE
NITRITE: NEGATIVE
PH: 8 (ref 5.0–8.0)
PROTEIN: NEGATIVE mg/dL
Specific Gravity, Urine: 1.002 — ABNORMAL LOW (ref 1.005–1.030)

## 2017-09-28 LAB — WET PREP, GENITAL
Clue Cells Wet Prep HPF POC: NONE SEEN
SPERM: NONE SEEN
TRICH WET PREP: NONE SEEN
Yeast Wet Prep HPF POC: NONE SEEN

## 2017-09-28 MED ORDER — BETAMETHASONE SOD PHOS & ACET 6 (3-3) MG/ML IJ SUSP
12.0000 mg | Freq: Once | INTRAMUSCULAR | Status: AC
  Administered 2017-09-28: 12 mg via INTRAMUSCULAR
  Filled 2017-09-28: qty 2

## 2017-09-28 MED ORDER — LACTATED RINGERS IV BOLUS (SEPSIS)
1000.0000 mL | Freq: Once | INTRAVENOUS | Status: AC
Start: 2017-09-28 — End: 2017-09-28
  Administered 2017-09-28: 1000 mL via INTRAVENOUS

## 2017-09-28 MED ORDER — NIFEDIPINE ER OSMOTIC RELEASE 30 MG PO TB24: 30.0000 mg | ORAL_TABLET | Freq: Every day | ORAL | 0 refills | Status: DC

## 2017-09-28 MED ORDER — BETAMETHASONE SOD PHOS & ACET 6 (3-3) MG/ML IJ SUSP: 12.0000 mg | Freq: Once | INTRAMUSCULAR | Status: DC

## 2017-09-28 MED ORDER — NIFEDIPINE 10 MG PO CAPS
10.0000 mg | ORAL_CAPSULE | ORAL | Status: AC
  Administered 2017-09-28 (×3): 10 mg via ORAL
  Filled 2017-09-28 (×3): qty 1

## 2017-09-28 NOTE — ED Notes (Signed)
[facility] arrival to transport pt [facility], PT NAD at transport

## 2017-09-28 NOTE — MAU Provider Note (Signed)
History  CSN: [id number] Arrival date and time: 09/27/17 2052  First Provider Initiated Contact with Patient 09/28/17 0100      Chief Complaint  Patient presents with  . Contractions    HPI: Andrea Weiss is a 21 y.o. G1P0 with IUP at 839w5d who [facility]. She was noted to be contracting there every 2-6 minutes. She has had limited PNC. She reports feeling mostly intermittent back pain, and cannot really tell contractions. She denies vaginal bleeding or LOF, but reports tha she has been passing her mucus plug for the last 2 days. Reports good fetal movement.   Also denies fevers, chills, malaise, dysuria, hematuria, urinary frequency, nausea, vomiting, diarrhea, RUQ/epigastric pain, dizziness/lighreadhess, or headache.   [facility], but has not seen anyone for the past month [place].    OB History  Gravida Para Term Preterm AB Living  1            SAB TAB Ectopic Multiple Live Births               # Outcome Date GA Lbr Len/2nd Weight Sex Delivery Anes PTL Lv  1 Current              Past Medical History:  Diagnosis Date  . Medical history non-contributory    Past Surgical History:  Procedure Laterality Date  . WISDOM TOOTH EXTRACTION Bilateral    History reviewed. No pertinent family history. Social History   Socioeconomic History  . Marital status: Single    Spouse name: Not on file  . Number of children: Not on file  . Years of education: Not on file  . Highest education level: Not on file  Social Needs  . Financial resource strain: Not on file  . Food insecurity - worry: Not on file  . Food insecurity - inability: Not on file  . Transportation needs - medical: Not on file  . Transportation needs - non-medical: Not on file  Occupational History  . Not on file  Tobacco Use  . Smoking status: Former Smoker    Packs/day: 0.50    Types: Cigarettes    Last attempt to  quit: 04/19/2017    Years since quitting: 0.4  . Smokeless tobacco: Never Used  Substance and Sexual Activity  . Alcohol use: No  . Drug use: Yes    Types: Marijuana  . Sexual activity: Yes    Birth control/protection: Condom  Other Topics Concern  . Not on file  Social History Narrative  . Not on file   Allergies  Allergen Reactions  . Mango Flavor Hives and Swelling    Allergy to mangos    Medications Prior to Admission  Medication Sig Dispense Refill Last Dose  . Prenatal Vit-Fe Fumarate-FA (MULTIVITAMIN-PRENATAL) 27-0.8 MG TABS tablet Take 1 tablet by mouth daily at 12 noon.   09/27/2017 at Unknown time    I have reviewed patient's Past Medical Hx, Surgical Hx, Family Hx, Social Hx, medications and allergies.   Review of Systems: Negative except for what is mentioned in HPI.  Physical Exam   Blood pressure 109/64, pulse 86, temperature 97.9 F (36.6 C), temperature source Oral, resp. rate 18, height 5\' 2"  (1.575 m), weight 110 lb (49.9 kg), last menstrual period 12/30/2016, SpO2 100 %.  Constitutional: Well-developed, well-nourished female in no acute distress.  HENT: [place]/AT, normal oropharynx mucosa. MMM Eyes: normal conjunctivae, no scleral icterus Cardiovascular: normal rate,  regular rhythm Respiratory: normal effort GI: Abd soft, non-tender. FH 32 cm GU: Neg CVAT. Pelvic: NEFG. Normal vaginal mucosa without lesions. Cervix pink, visually closed, without lesion, scant discharge SVE: 1cm/50%/-3 MSK: Extremities nontender, no edema Neurologic: Alert and oriented x 4. Psych: Normal mood and affect Skin: warm and dry   FHT:  Baseline 145 , moderate variability, accelerations present, no decelerations Toco: Contractions: q 2-3 mins  MAU Course/MDM:   Nursing notes and VS reviewed. Patient seen and examined, as noted above.  Reactive NST IVFs and Procardia given for contractions with improvement. Repeat SVE unchanged.   Results reviewed:  Results for orders  placed or performed during the hospital encounter of 09/27/17  Urinalysis, Routine w reflex microscopic  Result Value Ref Range   Color, Urine STRAW (A) YELLOW   APPearance CLEAR CLEAR   Specific Gravity, Urine 1.002 (L) 1.005 - 1.030   pH 8.0 5.0 - 8.0   Glucose, UA NEGATIVE NEGATIVE mg/dL   Hgb urine dipstick NEGATIVE NEGATIVE   Bilirubin Urine NEGATIVE NEGATIVE   Ketones, ur NEGATIVE NEGATIVE mg/dL   Protein, ur NEGATIVE NEGATIVE mg/dL   Nitrite NEGATIVE NEGATIVE   Leukocytes, UA NEGATIVE NEGATIVE    Assessment and Plan  Assessment: 1. Threatened premature labor in third trimester   2. Limited prenatal care in third trimester     Plan: --Discharge home on Procardia XL --BMZ #2 to be given Friday morning  --Labor precautions and fetal kick counts --[facility] pool for PNV (transfer of care).     Marrisa Kimber, Kandra NicolasJulie P, MD 09/28/2017 1:00 AM

## 2017-09-28 NOTE — Discharge Instructions (Signed)

## 2017-09-28 NOTE — MAU Note (Signed)
[facility]. Pt has had limited PNC.

## 2017-09-29 ENCOUNTER — Inpatient Hospital Stay (HOSPITAL_COMMUNITY)
Admission: AD | Admit: 2017-09-29 | Discharge: 2017-09-29 | Disposition: A | Discharge status: Home / Self Care | Payer: Medicaid Other | Source: Ambulatory Visit | Attending: Obstetrics & Gynecology | Admitting: Obstetrics & Gynecology

## 2017-09-29 DIAGNOSIS — Z3A36 36 weeks gestation of pregnancy: Secondary | ICD-10-CM | POA: Insufficient documentation

## 2017-09-29 LAB — GC/CHLAMYDIA PROBE AMP (~~LOC~~) NOT AT ARMC
CHLAMYDIA, DNA PROBE: NEGATIVE
Neisseria Gonorrhea: NEGATIVE

## 2017-09-29 MED ORDER — BETAMETHASONE SOD PHOS & ACET 6 (3-3) MG/ML IJ SUSP
12.0000 mg | Freq: Once | INTRAMUSCULAR | Status: AC
  Administered 2017-09-29: 12 mg via INTRAMUSCULAR
  Filled 2017-09-29: qty 2

## 2017-09-29 NOTE — MAU Note (Signed)
Pt here for second BMZ injection, denies bleeding or pain.

## 2017-09-30 LAB — CULTURE, BETA STREP (GROUP B ONLY)

## 2017-10-11 ENCOUNTER — Telehealth: Payer: Self-pay | Admitting: General Practice

## 2017-10-11 NOTE — Telephone Encounter (Signed)
Called patient and left message on VM in regards to New OB appointment scheduled for 10/18/17 at 2:20pm.  Asked patient to give our office a call back if unable to keep this appointment.

## 2017-10-14 ENCOUNTER — Encounter (HOSPITAL_COMMUNITY): Payer: Self-pay | Admitting: Emergency Medicine

## 2017-10-14 ENCOUNTER — Inpatient Hospital Stay (HOSPITAL_COMMUNITY)
Admission: AD | Admit: 2017-10-14 | Discharge: 2017-10-14 | Disposition: A | Discharge status: Home / Self Care | Payer: Medicaid Other | Source: Ambulatory Visit | Attending: Obstetrics and Gynecology | Admitting: Obstetrics and Gynecology

## 2017-10-14 DIAGNOSIS — Z3A35 35 weeks gestation of pregnancy: Secondary | ICD-10-CM | POA: Diagnosis not present

## 2017-10-14 DIAGNOSIS — O4703 False labor before 37 completed weeks of gestation, third trimester: Secondary | ICD-10-CM

## 2017-10-14 DIAGNOSIS — R109 Unspecified abdominal pain: Secondary | ICD-10-CM

## 2017-10-14 DIAGNOSIS — O26899 Other specified pregnancy related conditions, unspecified trimester: Secondary | ICD-10-CM

## 2017-10-14 MED ORDER — PROMETHAZINE HCL 25 MG/ML IJ SOLN
25.0000 mg | Freq: Once | INTRAMUSCULAR | Status: AC
  Administered 2017-10-14: 25 mg via INTRAMUSCULAR
  Filled 2017-10-14: qty 1

## 2017-10-14 NOTE — MAU Provider Note (Signed)
History     CSN: [id number]  Arrival date and time: 10/14/17 [id number]   First Provider Initiated Contact with Patient 10/14/17 [phone]      Chief Complaint  Patient presents with  . Contractions   HPI Andrea Weiss is a 21 y.o. G1P0 at 7130w0d who presents with contractions. She states they started at 1800 and have gotten progressively worse. On the car ride over, she said the contractions are every 1-2 minutes and she rates them a 10/10. She denies any vaginal bleeding or leaking of fluid. Reports good fetal movement.   She received prenatal care at the [facility], but moved and has not been recently. She has a NOB scheduled in the clinic next week.  OB History    Gravida Para Term Preterm AB Living   1             SAB TAB Ectopic Multiple Live Births                 Past Medical History:  Diagnosis Date  . Medical history non-contributory     Past Surgical History:  Procedure Laterality Date  . WISDOM TOOTH EXTRACTION Bilateral    No family history on file.  Social History   Tobacco Use  . Smoking status: Former Smoker    Packs/day: 0.50    Types: Cigarettes    Last attempt to quit: 04/19/2017    Years since quitting: 0.4  . Smokeless tobacco: Never Used  Substance Use Topics  . Alcohol use: No  . Drug use: Yes    Types: Marijuana   Allergies:  Allergies  Allergen Reactions  . Mango Flavor Hives and Swelling    Allergy to mangos   Facility-Administered Medications Prior to Admission  Medication Dose Route Frequency Provider Last Rate Last Dose  . betamethasone acetate-betamethasone sodium phosphate (CELESTONE) injection 12 mg  12 mg Intramuscular Once Degele, Kandra NicolasJulie P, MD       Medications Prior to Admission  Medication Sig Dispense Refill Last Dose  . NIFEdipine (PROCARDIA-XL/ADALAT-CC/NIFEDICAL-XL) 30 MG 24 hr tablet Take 1 tablet (30 mg total) daily for 7 days by mouth. 7 tablet 0   . Prenatal Vit-Fe Fumarate-FA (MULTIVITAMIN-PRENATAL) 27-0.8 MG TABS  tablet Take 1 tablet by mouth daily at 12 noon.   09/27/2017 at Unknown time   Review of Systems  Constitutional: Negative.  Negative for fatigue and fever.  HENT: Negative.   Respiratory: Negative.  Negative for shortness of breath.   Cardiovascular: Negative.  Negative for chest pain.  Gastrointestinal: Positive for abdominal pain. Negative for constipation, diarrhea, nausea and vomiting.  Genitourinary: Negative.  Negative for dysuria, vaginal bleeding and vaginal discharge.  Neurological: Negative.  Negative for dizziness and headaches.   Physical Exam   Blood pressure 100/86, pulse (!) 112, temperature 98.3 F (36.8 C), temperature source Oral, resp. rate 18, last menstrual period 12/30/2016, SpO2 100 %.  Physical Exam  Nursing note and vitals reviewed. Constitutional: She is oriented to person, place, and time. She appears well-developed and well-nourished. No distress.  HENT:  Head: Normocephalic.  Eyes: Pupils are equal, round, and reactive to light.  Cardiovascular: Normal rate, regular rhythm and normal heart sounds.  Respiratory: Effort normal and breath sounds normal. No respiratory distress.  GI: Soft. Bowel sounds are normal. She exhibits no distension. There is no tenderness.  Neurological: She is alert and oriented to person, place, and time.  Skin: Skin is warm and dry.  Psychiatric: She has a normal mood and  affect. Her behavior is normal. Judgment and thought content normal.   Dilation: 1 Effacement (%): 50 Cervical Position: Posterior Station: -1 Presentation: Vertex Exam by:: Ma Hillock. Neill CNM  Fetal Tracing:  Baseline: 150 bpm Variability: moderate Accels: 15x15 Decels: none  Toco: 1-2   MAU Course  Procedures  MDM No cervical change over 1.5 hours, contractions less frequent Patient requesting nausea medication- 25mg  phenergan IM  Assessment and Plan   1. Threatened premature labor, third trimester   2. Abdominal pain affecting pregnancy     -Discharge home in stable condition -Preterm labor precautions discussed -Patient advised [facility] for new OB appointment -Patient may return to MAU as needed or if her condition were to change or worsen  Rolm BookbinderCaroline M Neill CNM 10/14/2017, 4:19 AM

## 2017-10-14 NOTE — MAU Note (Signed)
Pt presents to mau with c/o contractions that started @ 1800 10/13/2017 that got stronger and closer together @ 0000 10/14/2017.  denies bleeding & LOF +FM

## 2017-10-14 NOTE — Discharge Instructions (Signed)
Abdominal Pain During Pregnancy Abdominal pain is common in pregnancy. Most of the time, it does not cause harm. There are many causes of abdominal pain. Some causes are more serious than others and sometimes the cause is not known. Abdominal pain can be a sign that something is very wrong with the pregnancy or the pain may have nothing to do with the pregnancy. Always tell your health care provider if you have any abdominal pain. Follow these instructions at home:  Do not have sex or put anything in your vagina until your symptoms go away completely.  Watch your abdominal pain for any changes.  Get plenty of rest until your pain improves.  Drink enough fluid to keep your urine clear or pale yellow.  Take over-the-counter or prescription medicines only as told by your health care provider.  Keep all follow-up visits as told by your health care provider. This is important. Contact a health care provider if:  You have a fever.  Your pain gets worse or you have cramping.  Your pain continues after resting. Get help right away if:  You are bleeding, leaking fluid, or passing tissue from the vagina.  You have vomiting or diarrhea that does not go away.  You have painful or bloody urination.  You notice a decrease in your baby's movements.  You feel very weak or faint.  You have shortness of breath.  You develop a severe headache with abdominal pain.  You have abnormal vaginal discharge with abdominal pain. This information is not intended to replace advice given to you by your health care provider. Make sure you discuss any questions you have with your health care provider. Document Released: 11/07/2005 Document Revised: 08/18/2016 Document Reviewed: 06/06/2013 Elsevier Interactive Patient Education  2018 [employer]  Fetal Movement Counts Patient Name: ________________________________________________ Patient Due Date: ____________________ What is a fetal movement count? A  fetal movement count is the number of times that you feel your baby move during a certain amount of time. This may also be called a fetal kick count. A fetal movement count is recommended for every pregnant woman. You may be asked to start counting fetal movements as early as week 28 of your pregnancy. Pay attention to when your baby is most active. You may notice your baby's sleep and wake cycles. You may also notice things that make your baby move more. You should do a fetal movement count:  When your baby is normally most active.  At the same time each day.  A good time to count movements is while you are resting, after having something to eat and drink. How do I count fetal movements? 1. Find a quiet, comfortable area. Sit, or lie down on your side. 2. Write down the date, the start time and stop time, and the number of movements that you felt between those two times. Take this information with you to your health care visits. 3. For 2 hours, count kicks, flutters, swishes, rolls, and jabs. You should feel at least 10 movements during 2 hours. 4. You may stop counting after you have felt 10 movements. 5. If you do not feel 10 movements in 2 hours, have something to eat and drink. Then, keep resting and counting for 1 hour. If you feel at least 4 movements during that hour, you may stop counting. Contact a health care provider if:  You feel fewer than 4 movements in 2 hours.  Your baby is not moving like he or she usually does.  Date: ____________ Start time: ____________ Stop time: ____________ Movements: ____________ Date: ____________ Start time: ____________ Stop time: ____________ Movements: ____________ Date: ____________ Start time: ____________ Stop time: ____________ Movements: ____________ Date: ____________ Start time: ____________ Stop time: ____________ Movements: ____________ Date: ____________ Start time: ____________ Stop time: ____________ Movements: ____________ Date:  ____________ Start time: ____________ Stop time: ____________ Movements: ____________ Date: ____________ Start time: ____________ Stop time: ____________ Movements: ____________ Date: ____________ Start time: ____________ Stop time: ____________ Movements: ____________ Date: ____________ Start time: ____________ Stop time: ____________ Movements: ____________ This information is not intended to replace advice given to you by your health care provider. Make sure you discuss any questions you have with your health care provider. Document Released: 12/07/2006 Document Revised: 07/06/2016 Document Reviewed: 12/17/2015 Elsevier Interactive Patient Education  2018 [employer]  Ball CorporationBraxton Hicks Contractions Contractions of the uterus can occur throughout pregnancy, but they are not always a sign that you are in labor. You may have practice contractions called Braxton Hicks contractions. These false labor contractions are sometimes confused with true labor. What are Deberah PeltonBraxton Hicks contractions? Braxton Hicks contractions are tightening movements that occur in the muscles of the uterus before labor. Unlike true labor contractions, these contractions do not result in opening (dilation) and thinning of the cervix. Toward the end of pregnancy (32-34 weeks), Braxton Hicks contractions can happen more often and may become stronger. These contractions are sometimes difficult to tell apart from true labor because they can be very uncomfortable. You should not feel embarrassed if you go to the hospital with false labor. Sometimes, the only way to tell if you are in true labor is for your health care provider to look for changes in the cervix. The health care provider will do a physical exam and may monitor your contractions. If you are not in true labor, the exam should show that your cervix is not dilating and your water has not broken. If there are no prenatal problems or other health problems associated with your  pregnancy, it is completely safe for you to be sent home with false labor. You may continue to have Braxton Hicks contractions until you go into true labor. How can I tell the difference between true labor and false labor?  Differences ? False labor ? Contractions last 30-70 seconds.: Contractions are usually shorter and not as strong as true labor contractions. ? Contractions become very regular.: Contractions are usually irregular. ? Discomfort is usually felt in the top of the uterus, and it spreads to the lower abdomen and low back.: Contractions are often felt in the front of the lower abdomen and in the groin. ? Contractions do not go away with walking.: Contractions may go away when you walk around or change positions while lying down. ? Contractions usually become more intense and increase in frequency.: Contractions get weaker and are shorter-lasting as time goes on. ? The cervix dilates and gets thinner.: The cervix usually does not dilate or become thin. Follow these instructions at home:  Take over-the-counter and prescription medicines only as told by your health care provider.  Keep up with your usual exercises and follow other instructions from your health care provider.  Eat and drink lightly if you think you are going into labor.  If Braxton Hicks contractions are making you uncomfortable: ? Change your position from lying down or resting to walking, or change from walking to resting. ? Sit and rest in a tub of warm water. ? Drink enough fluid to keep your urine  clear or pale yellow. Dehydration may cause these contractions. ? Do slow and deep breathing several times an hour.  Keep all follow-up prenatal visits as told by your health care provider. This is important. Contact a health care provider if:  You have a fever.  You have continuous pain in your abdomen. Get help right away if:  Your contractions become stronger, more regular, and closer together.  You have  fluid leaking or gushing from your vagina.  You pass blood-tinged mucus (bloody show).  You have bleeding from your vagina.  You have low back pain that you never had before.  You feel your babys head pushing down and causing pelvic pressure.  Your baby is not moving inside you as much as it used to. Summary  Contractions that occur before labor are called Braxton Hicks contractions, false labor, or practice contractions.  Braxton Hicks contractions are usually shorter, weaker, farther apart, and less regular than true labor contractions. True labor contractions usually become progressively stronger and regular and they become more frequent.  Manage discomfort from [facility] contractions by changing position, resting in a warm bath, drinking plenty of water, or practicing deep breathing. This information is not intended to replace advice given to you by your health care provider. Make sure you discuss any questions you have with your health care provider. Document Released: 11/07/2005 Document Revised: 09/26/2016 Document Reviewed: 09/26/2016 Elsevier Interactive Patient Education  2017 [employer].

## 2017-10-18 ENCOUNTER — Encounter: Payer: Self-pay | Admitting: Nurse Practitioner

## 2017-10-18 ENCOUNTER — Ambulatory Visit (INDEPENDENT_AMBULATORY_CARE_PROVIDER_SITE_OTHER): Payer: Medicaid Other | Admitting: Nurse Practitioner

## 2017-10-18 DIAGNOSIS — Z3403 Encounter for supervision of normal first pregnancy, third trimester: Secondary | ICD-10-CM

## 2017-10-18 DIAGNOSIS — Z3400 Encounter for supervision of normal first pregnancy, unspecified trimester: Secondary | ICD-10-CM

## 2017-10-18 LAB — POCT URINALYSIS DIP (DEVICE)
BILIRUBIN URINE: NEGATIVE
GLUCOSE, UA: NEGATIVE mg/dL
Ketones, ur: NEGATIVE mg/dL
LEUKOCYTES UA: NEGATIVE
NITRITE: NEGATIVE
Protein, ur: NEGATIVE mg/dL
Specific Gravity, Urine: >=1.03 (ref 1.005–1.030)
Urobilinogen, UA: 0.2 mg/dL (ref 0.0–1.0)
pH: 6.5 (ref 5.0–8.0)

## 2017-10-18 MED ORDER — PRENATAL 27-0.8 MG PO TABS: 1.0000 | ORAL_TABLET | Freq: Every day | ORAL | 11 refills | Status: AC

## 2017-10-18 NOTE — Patient Instructions (Addendum)

## 2017-10-18 NOTE — Progress Notes (Signed)
Subjective:   Andrea Weiss is a 21 y.o. G1P0 at 5758w4d by early ultrasound being seen today for her first obstetrical visit.  Her obstetrical history is significant for preterm contractions at 35 weeks. Patient does intend to breast feed. Pregnancy history fully reviewed.  Cl;ient has been receiving prenatal care at [facility].  [place].  Patient reports no complaints.  HISTORY: Obstetric History   G1   P0   T0   P0   A0   L0    SAB0   TAB0   Ectopic0   Multiple0   Live Births0     # Outcome Date GA Lbr Len/2nd Weight Sex Delivery Anes PTL Lv  1 Current              Past Medical History:  Diagnosis Date  . Medical history non-contributory    Past Surgical History:  Procedure Laterality Date  . WISDOM TOOTH EXTRACTION Bilateral    No family history on file. Social History   Tobacco Use  . Smoking status: Former Smoker    Packs/day: 0.50    Types: Cigarettes    Last attempt to quit: 04/19/2017    Years since quitting: 0.4  . Smokeless tobacco: Never Used  Substance Use Topics  . Alcohol use: No  . Drug use: Yes    Types: Marijuana   Allergies  Allergen Reactions  . Mango Flavor Hives and Swelling    Allergy to mangos   No current outpatient medications on file prior to visit.   No current facility-administered medications on file prior to visit.      Exam   Vitals:   10/18/17 1444  BP: 115/68  Pulse: (!) 110  Weight: 127 lb 14.4 oz (58 kg)   Fetal Heart Rate (bpm): 147 Dictation #1 [id number]  [id number]   Uterus:  Fundal Height: 37 cm  Pelvic Exam: deferred                       System: General: well-developed, well-nourished female in no acute distress   Breast:  normal appearance, Nipples erect   Skin: normal coloration and turgor, no rashes   Neurologic: oriented, normal, negative, normal mood   Extremities: normal strength, tone, and muscle mass, ROM  of all joints is normal   HEENT extraocular movement intact and sclera clear, anicteric   Mouth/Teeth mucous membranes moist, pharynx normal without lesions and dental hygiene good   Neck supple    Cardiovascular: regular rate and rhythm   Respiratory:  no respiratory distress, normal breath sounds   Abdomen: soft, non-tender; bowel sounds normal; no masses,  no organomegaly     Assessment:   Pregnancy: G1P0 Patient Active Problem List   Diagnosis Date Noted  . Supervision of normal first pregnancy, antepartum 10/18/2017  . Limited prenatal care in third trimester 09/28/2017  . Threatened premature labor in third trimester 09/28/2017  . Indication for care in labor or delivery 07/22/2017     Plan:  1. Supervision of normal first pregnancy, antepartum   Have requested her prenatal records [facility] Continue prenatal vitamins - Rx [place]. Problem list reviewed and updated.  Reviewed her records of her last MAU visit. The nature of [facility] with multiple MDs and other Advanced Practice Providers was explained to patient; also emphasized that residents,  students are part of our team. Routine obstetric precautions reviewed. Return in about 1 week (around 10/25/2017).  Total face-to-face time with patient: 25 minutes.  Over 50% of encounter was spent on counseling and coordination of care.     Nolene BernheimERRI Tyshae Stair, FNP Family Nurse Practitioner, [facility], [facility] 10/18/2017 5:53 PM

## 2017-10-19 LAB — CBC
HEMATOCRIT: 31.5 % — AB (ref 34.0–46.6)
HEMOGLOBIN: 10.3 g/dL — AB (ref 11.1–15.9)
MCH: 26 pg — AB (ref 26.6–33.0)
MCHC: 32.7 g/dL (ref 31.5–35.7)
MCV: 80 fL (ref 79–97)
Platelets: 155 10*3/uL (ref 150–379)
RBC: 3.96 x10E6/uL (ref 3.77–5.28)
RDW: 14.4 % (ref 12.3–15.4)
WBC: 7.1 10*3/uL (ref 3.4–10.8)

## 2017-10-26 ENCOUNTER — Other Ambulatory Visit (HOSPITAL_COMMUNITY)
Admission: RE | Admit: 2017-10-26 | Discharge: 2017-10-26 | Disposition: A | Discharge status: Home / Self Care | Payer: Medicaid Other | Source: Ambulatory Visit | Attending: Student | Admitting: Student

## 2017-10-26 ENCOUNTER — Ambulatory Visit (INDEPENDENT_AMBULATORY_CARE_PROVIDER_SITE_OTHER): Payer: Medicaid Other | Admitting: Student

## 2017-10-26 VITALS — BP 99/61 | HR 103 | Wt 126.0 lb

## 2017-10-26 DIAGNOSIS — Z3A36 36 weeks gestation of pregnancy: Secondary | ICD-10-CM | POA: Diagnosis not present

## 2017-10-26 DIAGNOSIS — Z3403 Encounter for supervision of normal first pregnancy, third trimester: Secondary | ICD-10-CM | POA: Insufficient documentation

## 2017-10-26 DIAGNOSIS — O4703 False labor before 37 completed weeks of gestation, third trimester: Secondary | ICD-10-CM

## 2017-10-26 DIAGNOSIS — Z3400 Encounter for supervision of normal first pregnancy, unspecified trimester: Secondary | ICD-10-CM

## 2017-10-26 LAB — OB RESULTS CONSOLE GBS: GBS: NEGATIVE

## 2017-10-26 NOTE — Progress Notes (Signed)
   PRENATAL VISIT NOTE  Subjective:  Andrea Weiss is a 21 y.o. G1P0 at 7416w5d being seen today for ongoing prenatal care.  She is currently monitored for the following issues for this low-risk pregnancy and has Indication for care in labor or delivery; Limited prenatal care in third trimester; Threatened premature labor in third trimester; and Supervision of normal first pregnancy, antepartum on their problem list.  Patient reports occasional contractions.  Contractions: Irritability. Vag. Bleeding: None.  Movement: Present. Denies leaking of fluid.   The following portions of the patient's history were reviewed and updated as appropriate: allergies, current medications, past family history, past medical history, past social history, past surgical history and problem list. Problem list updated.  Objective:   Vitals:   10/26/17 1320  BP: 99/61  Pulse: (!) 103  Weight: 126 lb (57.2 kg)    Fetal Status: Fetal Heart Rate (bpm): 148 Fundal Height: 37 cm Movement: Present  Presentation: Vertex  General:  Alert, oriented and cooperative. Patient is in no acute distress.  Skin: Skin is warm and dry. No rash noted.   Cardiovascular: Normal heart rate noted  Respiratory: Normal respiratory effort, no problems with respiration noted  Abdomen: Soft, gravid, appropriate for gestational age.  Pain/Pressure: Present     Pelvic: Cervical exam performed        Extremities: Normal range of motion.  Edema: Trace  Mental Status:  Normal mood and affect. Normal behavior. Normal judgment and thought content.   Assessment and Plan:  Pregnancy: G1P0 at 7816w5d  1. Supervision of normal first pregnancy, antepartum Doing well; reviewed signs of labor.  -GC CT and GBS today -Reviewed glucose test; 1 hour screening was 96.  -Reviewed anatomy scan; normal results.   2. Threatened premature labor in third trimester Still with contractions, mostly at night and with extended walking.    Term labor  symptoms and general obstetric precautions including but not limited to vaginal bleeding, contractions, leaking of fluid and fetal movement were reviewed in detail with the patient. Please refer to After Visit Summary for other counseling recommendations.  Return in about 1 week (around 11/02/2017).   Marylene LandKathryn Lorraine Jeramie Scogin, CNM

## 2017-10-26 NOTE — Addendum Note (Signed)
Addended by: Mikey BussingWILSON, Tyreck Bell L on: 10/26/2017 05:19 PM   Modules accepted: Orders

## 2017-10-26 NOTE — Patient Instructions (Signed)

## 2017-10-28 LAB — GC/CHLAMYDIA PROBE AMP (~~LOC~~) NOT AT ARMC
Chlamydia: NEGATIVE
NEISSERIA GONORRHEA: NEGATIVE

## 2017-10-30 LAB — CULTURE, BETA STREP (GROUP B ONLY): STREP GP B CULTURE: NEGATIVE

## 2017-10-31 ENCOUNTER — Inpatient Hospital Stay (HOSPITAL_COMMUNITY)
Admission: AD | Admit: 2017-10-31 | Discharge: 2017-10-31 | Disposition: A | Discharge status: Home / Self Care | Payer: Medicaid Other | Source: Ambulatory Visit | Attending: Obstetrics and Gynecology | Admitting: Obstetrics and Gynecology

## 2017-10-31 DIAGNOSIS — Z79899 Other long term (current) drug therapy: Secondary | ICD-10-CM | POA: Diagnosis not present

## 2017-10-31 DIAGNOSIS — Z3A37 37 weeks gestation of pregnancy: Secondary | ICD-10-CM | POA: Diagnosis not present

## 2017-10-31 DIAGNOSIS — O26893 Other specified pregnancy related conditions, third trimester: Secondary | ICD-10-CM | POA: Diagnosis not present

## 2017-10-31 DIAGNOSIS — Z87891 Personal history of nicotine dependence: Secondary | ICD-10-CM | POA: Insufficient documentation

## 2017-10-31 DIAGNOSIS — N898 Other specified noninflammatory disorders of vagina: Secondary | ICD-10-CM | POA: Insufficient documentation

## 2017-10-31 LAB — AMNISURE RUPTURE OF MEMBRANE (ROM) NOT AT ARMC: AMNISURE: NEGATIVE

## 2017-10-31 NOTE — Discharge Instructions (Signed)
Vaginal Delivery Vaginal delivery means that you will give birth by pushing your baby out of your birth canal (vagina). A team of health care providers will help you before, during, and after vaginal delivery. Birth experiences are unique for every woman and every pregnancy, and birth experiences vary depending on where you choose to give birth. What should I do to prepare for my baby's birth? Before your baby is born, it is important to talk with your health care provider about:  Your labor and delivery preferences. These may include: ? Medicines that you may be given. ? How you will manage your pain. This might include non-medical pain relief techniques or injectable pain relief such as epidural analgesia. ? How you and your baby will be monitored during labor and delivery. ? Who may be in the labor and delivery room with you. ? Your feelings about surgical delivery of your baby (cesarean delivery, or C-section) if this becomes necessary. ? Your feelings about receiving donated blood through an IV tube (blood transfusion) if this becomes necessary.  Whether you are able: ? To take pictures or videos of the birth. ? To eat during labor and delivery. ? To move around, walk, or change positions during labor and delivery.  What to expect after your baby is born, such as: ? Whether delayed umbilical cord clamping and cutting is offered. ? Who will care for your baby right after birth. ? Medicines or tests that may be recommended for your baby. ? Whether breastfeeding is supported in your hospital or birth center. ? How long you will be in the hospital or birth center.  How any medical conditions you have may affect your baby or your labor and delivery experience.  To prepare for your baby's birth, you should also:  Attend all of your health care visits before delivery (prenatal visits) as recommended by your health care provider. This is important.  Prepare your home for your baby's  arrival. Make sure that you have: ? Diapers. ? Baby clothing. ? Feeding equipment. ? Safe sleeping arrangements for you and your baby.  Install a car seat in your vehicle. Have your car seat checked by a certified car seat installer to make sure that it is installed safely.  Think about who will help you with your new baby at home for at least the first several weeks after delivery.  What can I expect when I arrive at the birth center or hospital? Once you are in labor and have been admitted into the hospital or birth center, your health care provider may:  Review your pregnancy history and any concerns you have.  Insert an IV tube into one of your veins. This is used to give you fluids and medicines.  Check your blood pressure, pulse, temperature, and heart rate (vital signs).  Check whether your bag of water (amniotic sac) has broken (ruptured).  Talk with you about your birth plan and discuss pain control options.  Monitoring Your health care provider may monitor your contractions (uterine monitoring) and your baby's heart rate (fetal monitoring). You may need to be monitored:  Often, but not continuously (intermittently).  All the time or for long periods at a time (continuously). Continuous monitoring may be needed if: ? You are taking certain medicines, such as medicine to relieve pain or make your contractions stronger. ? You have pregnancy or labor complications.  Monitoring may be done by:  Placing a special stethoscope or a handheld monitoring device on your abdomen to   check your baby's heartbeat, and feeling your abdomen for contractions. This method of monitoring does not continuously record your baby's heartbeat or your contractions.  Placing monitors on your abdomen (external monitors) to record your baby's heartbeat and the frequency and length of contractions. You may not have to wear external monitors all the time.  Placing monitors inside of your uterus  (internal monitors) to record your baby's heartbeat and the frequency, length, and strength of your contractions. ? Your health care provider may use internal monitors if he or she needs more information about the strength of your contractions or your baby's heart rate. ? Internal monitors are put in place by passing a thin, flexible wire through your vagina and into your uterus. Depending on the type of monitor, it may remain in your uterus or on your baby's head until birth. ? Your health care provider will discuss the benefits and risks of internal monitoring with you and will ask for your permission before inserting the monitors.  Telemetry. This is a type of continuous monitoring that can be done with external or internal monitors. Instead of having to stay in bed, you are able to move around during telemetry. Ask your health care provider if telemetry is an option for you.  Physical exam Your health care provider may perform a physical exam. This may include:  Checking whether your baby is positioned: ? With the head toward your vagina (head-down). This is most common. ? With the head toward the top of your uterus (head-up or breech). If your baby is in a breech position, your health care provider may try to turn your baby to a head-down position so you can deliver vaginally. If it does not seem that your baby can be born vaginally, your provider may recommend surgery to deliver your baby. In rare cases, you may be able to deliver vaginally if your baby is head-up (breech delivery). ? Lying sideways (transverse). Babies that are lying sideways cannot be delivered vaginally.  Checking your cervix to determine: ? Whether it is thinning out (effacing). ? Whether it is opening up (dilating). ? How low your baby has moved into your birth canal.  What are the three stages of labor and delivery?  Normal labor and delivery is divided into the following three stages: Stage 1  Stage 1 is the  longest stage of labor, and it can last for hours or days. Stage 1 includes: ? Early labor. This is when contractions may be irregular, or regular and mild. Generally, early labor contractions are more than 10 minutes apart. ? Active labor. This is when contractions get longer, more regular, more frequent, and more intense. ? The transition phase. This is when contractions happen very close together, are very intense, and may last longer than during any other part of labor.  Contractions generally feel mild, infrequent, and irregular at first. They get stronger, more frequent (about every 2-3 minutes), and more regular as you progress from early labor through active labor and transition.  Many women progress through stage 1 naturally, but you may need help to continue making progress. If this happens, your health care provider may talk with you about: ? Rupturing your amniotic sac if it has not ruptured yet. ? Giving you medicine to help make your contractions stronger and more frequent.  Stage 1 ends when your cervix is completely dilated to 4 inches (10 cm) and completely effaced. This happens at the end of the transition phase. Stage 2  Once   your cervix is completely effaced and dilated to 4 inches (10 cm), you may start to feel an urge to push. It is common for the body to naturally take a rest before feeling the urge to push, especially if you received an epidural or certain other pain medicines. This rest period may last for up to 1-2 hours, depending on your unique labor experience.  During stage 2, contractions are generally less painful, because pushing helps relieve contraction pain. Instead of contraction pain, you may feel stretching and burning pain, especially when the widest part of your baby's head passes through the vaginal opening (crowning).  Your health care provider will closely monitor your pushing progress and your baby's progress through the vagina during stage 2.  Your  health care provider may massage the area of skin between your vaginal opening and anus (perineum) or apply warm compresses to your perineum. This helps it stretch as the baby's head starts to crown, which can help prevent perineal tearing. ? In some cases, an incision may be made in your perineum (episiotomy) to allow the baby to pass through the vaginal opening. An episiotomy helps to make the opening of the vagina larger to allow more room for the baby to fit through.  It is very important to breathe and focus so your health care provider can control the delivery of your baby's head. Your health care provider may have you decrease the intensity of your pushing, to help prevent perineal tearing.  After delivery of your baby's head, the shoulders and the rest of the body generally deliver very quickly and without difficulty.  Once your baby is delivered, the umbilical cord may be cut right away, or this may be delayed for 1-2 minutes, depending on your baby's health. This may vary among health care providers, hospitals, and birth centers.  If you and your baby are healthy enough, your baby may be placed on your chest or abdomen to help maintain the baby's temperature and to help you bond with each other. Some mothers and babies start breastfeeding at this time. Your health care team will dry your baby and help keep your baby warm during this time.  Your baby may need immediate care if he or she: ? Showed signs of distress during labor. ? Has a medical condition. ? Was born too early (prematurely). ? Had a bowel movement before birth (meconium). ? Shows signs of difficulty transitioning from being inside the uterus to being outside of the uterus. If you are planning to breastfeed, your health care team will help you begin a feeding. Stage 3  The third stage of labor starts immediately after the birth of your baby and ends after you deliver the placenta. The placenta is an organ that develops  during pregnancy to provide oxygen and nutrients to your baby in the womb.  Delivering the placenta may require some pushing, and you may have mild contractions. Breastfeeding can stimulate contractions to help you deliver the placenta.  After the placenta is delivered, your uterus should tighten (contract) and become firm. This helps to stop bleeding in your uterus. To help your uterus contract and to control bleeding, your health care provider may: ? Give you medicine by injection, through an IV tube, by mouth, or through your rectum (rectally). ? Massage your abdomen or perform a vaginal exam to remove any blood clots that are left in your uterus. ? Empty your bladder by placing a thin, flexible tube (catheter) into your bladder. ? Encourage   you to breastfeed your baby. After labor is over, you and your baby will be monitored closely to ensure that you are both healthy until you are ready to go home. Your health care team will teach you how to care for yourself and your baby. This information is not intended to replace advice given to you by your health care provider. Make sure you discuss any questions you have with your health care provider. Document Released: 08/16/2008 Document Revised: 05/27/2016 Document Reviewed: 11/22/2015 Elsevier Interactive Patient Education  2018 [employer]  

## 2017-10-31 NOTE — MAU Note (Signed)
Pt reports a gush of clear fluid around 7pm. States she has not had any more leaking since this episode but is concerned water broke. Pt denies contractions or vaginal bleeding. Reports good fetal movement.

## 2017-10-31 NOTE — MAU Provider Note (Signed)
Patient Andrea Weiss is a 21 y.o. G1P0 at 2762w3d here with complaints of gush of fluid last night at around 10 pm. She denies vaginal bleeding, abnormal discharge, decreased fetal movements or other ob-gyn complaint.  History     CSN: [id number]  Arrival date and time: 10/31/17 [id number]   None     Chief Complaint  Patient presents with  . Rupture of Membranes   Vaginal Discharge  The patient's primary symptoms include vaginal discharge. This is a new problem. The current episode started today. The problem occurs rarely. The problem has been resolved. Pertinent negatives include no abdominal pain, constipation, diarrhea, dysuria, fever, nausea, urgency or vomiting. The vaginal discharge was watery. There has been no bleeding. She has not been passing clots. She has not been passing tissue. Nothing aggravates the symptoms. She has tried nothing for the symptoms.   Patient felt a large gush of watery fluid when she was walking in her bathroom last night. She says it was not mucousy, but watery. She felt it like a "burst". She did not start having contractions so she went to sleep and waited for labor pains to start. Her mother brought her in this morning.  OB History    Gravida Para Term Preterm AB Living   1             SAB TAB Ectopic Multiple Live Births                  Past Medical History:  Diagnosis Date  . Medical history non-contributory     Past Surgical History:  Procedure Laterality Date  . WISDOM TOOTH EXTRACTION Bilateral     No family history on file.  Social History   Tobacco Use  . Smoking status: Former Smoker    Packs/day: 0.50    Types: Cigarettes    Last attempt to quit: 04/19/2017    Years since quitting: 0.5  . Smokeless tobacco: Never Used  Substance Use Topics  . Alcohol use: No  . Drug use: Yes    Types: Marijuana    Allergies:  Allergies  Allergen Reactions  . Mango Flavor Hives and Swelling    Allergy to mangos    Medications Prior  to Admission  Medication Sig Dispense Refill Last Dose  . Prenatal Vit-Fe Fumarate-FA (MULTIVITAMIN-PRENATAL) 27-0.8 MG TABS tablet Take 1 tablet by mouth daily for 360 doses. 30 tablet 11 10/30/2017 at Unknown time    Review of Systems  Constitutional: Negative for fever.  Cardiovascular: Negative.   Gastrointestinal: Negative for abdominal pain, constipation, diarrhea, nausea and vomiting.  Genitourinary: Positive for vaginal discharge. Negative for dysuria and urgency.  Neurological: Negative.    Physical Exam   Blood pressure 110/65, pulse (!) 110, temperature 98.1 F (36.7 C), resp. rate 17, height 5\' 4"  (1.626 m), weight 129 lb (58.5 kg), last menstrual period 12/30/2016.  Physical Exam  Constitutional: She is oriented to person, place, and time. She appears well-developed.  HENT:  Head: Normocephalic.  Neck: Normal range of motion.  GI: Soft.  Genitourinary: Vagina normal. No vaginal discharge found.  Genitourinary Comments: Cervix is soft, posterior, 1 cm. -2 station.   Musculoskeletal: Normal range of motion.  Neurological: She is alert and oriented to person, place, and time. She has normal reflexes.  Skin: Skin is warm and dry.  Psychiatric: She has a normal mood and affect.    MAU Course  Procedures  MDM Uneventful MAU course; NST-140 bpm mod variability, no decels, present  acels, irregular contractions.  -speculum exam deferred as amnisure ordered:  negative.   Assessment and Plan   1. Vaginal discharge during pregnancy in third trimester    2.Reviewed warning signs and when to return to the MAU (bleeding, leaking of fluid, decreased FM) or other concern.  3. Patient to keep ob-gyn appt tomorrow.   Charlesetta GaribaldiKathryn Lorraine Kooistra 10/31/2017, 7:30 AM

## 2017-11-01 ENCOUNTER — Encounter: Payer: Self-pay | Admitting: Medical

## 2017-11-01 ENCOUNTER — Ambulatory Visit (INDEPENDENT_AMBULATORY_CARE_PROVIDER_SITE_OTHER): Payer: Medicaid Other | Admitting: Medical

## 2017-11-01 VITALS — BP 122/68 | HR 115 | Wt 130.1 lb

## 2017-11-01 DIAGNOSIS — Z3403 Encounter for supervision of normal first pregnancy, third trimester: Secondary | ICD-10-CM

## 2017-11-01 DIAGNOSIS — O0933 Supervision of pregnancy with insufficient antenatal care, third trimester: Secondary | ICD-10-CM

## 2017-11-01 DIAGNOSIS — O368130 Decreased fetal movements, third trimester, not applicable or unspecified: Secondary | ICD-10-CM | POA: Diagnosis not present

## 2017-11-01 DIAGNOSIS — Z3400 Encounter for supervision of normal first pregnancy, unspecified trimester: Secondary | ICD-10-CM

## 2017-11-01 NOTE — Progress Notes (Signed)
   PRENATAL VISIT NOTE  Subjective:  Andrea Weiss is a 21 y.o. G1P0 at 3850w4d being seen today for ongoing prenatal care.  She is currently monitored for the following issues for this low-risk pregnancy and has Indication for care in labor or delivery; Limited prenatal care in third trimester; Threatened premature labor in third trimester; and Supervision of normal first pregnancy, antepartum on their problem list.  Patient reports occasional contractions and decreased fetal movement since last visit.  Contractions: Irritability. Vag. Bleeding: None.  Movement: (!) Decreased. Denies leaking of fluid.   The following portions of the patient's history were reviewed and updated as appropriate: allergies, current medications, past family history, past medical history, past social history, past surgical history and problem list. Problem list updated.  Objective:   Vitals:   11/01/17 1058  BP: 122/68  Pulse: (!) 115  Weight: 130 lb 1.6 oz (59 kg)    Fetal Status: Fetal Heart Rate (bpm): 159 Fundal Height: 38 cm Movement: (!) Decreased  Presentation: Vertex  General:  Alert, oriented and cooperative. Patient is in no acute distress.  Skin: Skin is warm and dry. No rash noted.   Cardiovascular: Normal heart rate noted  Respiratory: Normal respiratory effort, no problems with respiration noted  Abdomen: Soft, gravid, appropriate for gestational age.  Pain/Pressure: Absent     Pelvic: Cervical exam performed Dilation: 1 Effacement (%): 80 Station: -2  Extremities: Normal range of motion.  Edema: Trace  Mental Status:  Normal mood and affect. Normal behavior. Normal judgment and thought content.   Assessment and Plan:  Pregnancy: G1P0 at 7950w4d  1. Supervision of normal first pregnancy, antepartum - Discussed negative GBS test from last visit  2. Limited prenatal care in third trimester  3. Decreased fetal movements in third trimester, single or unspecified fetus - Fetal nonstress  test; today   Fetal Monitoring: Baseline: 140 bpm Variability: moderate Accelerations: 15 x 15 Decelerations: none Contractions: few, irregular   Term labor symptoms and general obstetric precautions including but not limited to vaginal bleeding, contractions, leaking of fluid and fetal movement were reviewed in detail with the patient. Please refer to After Visit Summary for other counseling recommendations.  Return in about 1 week (around 11/08/2017) for LOB.   Vonzella NippleJulie Wenzel, PA-C

## 2017-11-01 NOTE — Patient Instructions (Signed)
Fetal Movement Counts °Patient Name: ________________________________________________ Patient Due Date: ____________________ °What is a fetal movement count? °A fetal movement count is the number of times that you feel your baby move during a certain amount of time. This may also be called a fetal kick count. A fetal movement count is recommended for every pregnant woman. You may be asked to start counting fetal movements as early as week 28 of your pregnancy. °Pay attention to when your baby is most active. You may notice your baby's sleep and wake cycles. You may also notice things that make your baby move more. You should do a fetal movement count: °· When your baby is normally most active. °· At the same time each day. ° °A good time to count movements is while you are resting, after having something to eat and drink. °How do I count fetal movements? °1. Find a quiet, comfortable area. Sit, or lie down on your side. °2. Write down the date, the start time and stop time, and the number of movements that you felt between those two times. Take this information with you to your health care visits. °3. For 2 hours, count kicks, flutters, swishes, rolls, and jabs. You should feel at least 10 movements during 2 hours. °4. You may stop counting after you have felt 10 movements. °5. If you do not feel 10 movements in 2 hours, have something to eat and drink. Then, keep resting and counting for 1 hour. If you feel at least 4 movements during that hour, you may stop counting. °Contact a health care provider if: °· You feel fewer than 4 movements in 2 hours. °· Your baby is not moving like he or she usually does. °Date: ____________ Start time: ____________ Stop time: ____________ Movements: ____________ °Date: ____________ Start time: ____________ Stop time: ____________ Movements: ____________ °Date: ____________ Start time: ____________ Stop time: ____________ Movements: ____________ °Date: ____________ Start time:  ____________ Stop time: ____________ Movements: ____________ °Date: ____________ Start time: ____________ Stop time: ____________ Movements: ____________ °Date: ____________ Start time: ____________ Stop time: ____________ Movements: ____________ °Date: ____________ Start time: ____________ Stop time: ____________ Movements: ____________ °Date: ____________ Start time: ____________ Stop time: ____________ Movements: ____________ °Date: ____________ Start time: ____________ Stop time: ____________ Movements: ____________ °This information is not intended to replace advice given to you by your health care provider. Make sure you discuss any questions you have with your health care provider. °Document Released: 12/07/2006 Document Revised: 07/06/2016 Document Reviewed: 12/17/2015 °Elsevier Interactive Patient Education © 2018 [employer] °Braxton Hicks Contractions °Contractions of the uterus can occur throughout pregnancy, but they are not always a sign that you are in labor. You may have practice contractions called Braxton Hicks contractions. These false labor contractions are sometimes confused with true labor. °What are Braxton Hicks contractions? °Braxton Hicks contractions are tightening movements that occur in the muscles of the uterus before labor. Unlike true labor contractions, these contractions do not result in opening (dilation) and thinning of the cervix. Toward the end of pregnancy (32-34 weeks), Braxton Hicks contractions can happen more often and may become stronger. These contractions are sometimes difficult to tell apart from true labor because they can be very uncomfortable. You should not feel embarrassed if you go to the hospital with false labor. °Sometimes, the only way to tell if you are in true labor is for your health care provider to look for changes in the cervix. The health care provider will do a physical exam and may monitor your contractions. If   you are not in true labor, the exam  should show that your cervix is not dilating and your water has not broken. °If there are no prenatal problems or other health problems associated with your pregnancy, it is completely safe for you to be sent home with false labor. You may continue to have Braxton Hicks contractions until you go into true labor. °How can I tell the difference between true labor and false labor? °· Differences °? False labor °? Contractions last 30-70 seconds.: Contractions are usually shorter and not as strong as true labor contractions. °? Contractions become very regular.: Contractions are usually irregular. °? Discomfort is usually felt in the top of the uterus, and it spreads to the lower abdomen and low back.: Contractions are often felt in the front of the lower abdomen and in the groin. °? Contractions do not go away with walking.: Contractions may go away when you walk around or change positions while lying down. °? Contractions usually become more intense and increase in frequency.: Contractions get weaker and are shorter-lasting as time goes on. °? The cervix dilates and gets thinner.: The cervix usually does not dilate or become thin. °Follow these instructions at home: °· Take over-the-counter and prescription medicines only as told by your health care provider. °· Keep up with your usual exercises and follow other instructions from your health care provider. °· Eat and drink lightly if you think you are going into labor. °· If Braxton Hicks contractions are making you uncomfortable: °? Change your position from lying down or resting to walking, or change from walking to resting. °? Sit and rest in a tub of warm water. °? Drink enough fluid to keep your urine clear or pale yellow. Dehydration may cause these contractions. °? Do slow and deep breathing several times an hour. °· Keep all follow-up prenatal visits as told by your health care provider. This is important. °Contact a health care provider if: °· You have a  fever. °· You have continuous pain in your abdomen. °Get help right away if: °· Your contractions become stronger, more regular, and closer together. °· You have fluid leaking or gushing from your vagina. °· You pass blood-tinged mucus (bloody show). °· You have bleeding from your vagina. °· You have low back pain that you never had before. °· You feel your baby’s head pushing down and causing pelvic pressure. °· Your baby is not moving inside you as much as it used to. °Summary °· Contractions that occur before labor are called Braxton Hicks contractions, false labor, or practice contractions. °· Braxton Hicks contractions are usually shorter, weaker, farther apart, and less regular than true labor contractions. True labor contractions usually become progressively stronger and regular and they become more frequent. °· Manage discomfort from Braxton Hicks contractions by changing position, resting in a warm bath, drinking plenty of water, or practicing deep breathing. °This information is not intended to replace advice given to you by your health care provider. Make sure you discuss any questions you have with your health care provider. °Document Released: 11/07/2005 Document Revised: 09/26/2016 Document Reviewed: 09/26/2016 °Elsevier Interactive Patient Education © 2017 [employer] ° °

## 2017-11-08 ENCOUNTER — Ambulatory Visit (INDEPENDENT_AMBULATORY_CARE_PROVIDER_SITE_OTHER): Payer: Medicaid Other | Admitting: Family Medicine

## 2017-11-08 ENCOUNTER — Telehealth: Payer: Self-pay | Admitting: General Practice

## 2017-11-08 VITALS — BP 122/68 | HR 102 | Wt 130.3 lb

## 2017-11-08 DIAGNOSIS — Z3400 Encounter for supervision of normal first pregnancy, unspecified trimester: Secondary | ICD-10-CM

## 2017-11-08 NOTE — Progress Notes (Signed)
   PRENATAL VISIT NOTE  Subjective:  Andrea Weiss is a 21 y.o. G1P0 at 8130w4d being seen today for ongoing prenatal care.  She is currently monitored for the following issues for this low-risk pregnancy and has Indication for care in labor or delivery; Limited prenatal care in third trimester; Threatened premature labor in third trimester; and Supervision of normal first pregnancy, antepartum on their problem list.  Patient reports no complaints.  Contractions: Irregular. Vag. Bleeding: None.  Movement: Present. Denies leaking of fluid.   The following portions of the patient's history were reviewed and updated as appropriate: allergies, current medications, past family history, past medical history, past social history, past surgical history and problem list. Problem list updated.  Objective:   Vitals:   11/08/17 1126  BP: 122/68  Pulse: (!) 102  Weight: 130 lb 4.8 oz (59.1 kg)    Fetal Status: Fetal Heart Rate (bpm): 148 Fundal Height: 37 cm Movement: Present  Presentation: Vertex  General:  Alert, oriented and cooperative. Patient is in no acute distress.  Skin: Skin is warm and dry. No rash noted.   Cardiovascular: Normal heart rate noted  Respiratory: Normal respiratory effort, no problems with respiration noted  Abdomen: Soft, gravid, appropriate for gestational age.  Pain/Pressure: Present     Pelvic:  cervical exam: 2/80/-2  Extremities: Normal range of motion.  Edema: None  Mental Status:  Normal mood and affect. Normal behavior. Normal judgment and thought content.   Assessment and Plan:  Pregnancy: G1P0 at 3630w4d  1. Supervision of normal first pregnancy, antepartum Patient desires to be induced, no medical indication. Spent long amount of time counseling patient about this.   Term labor symptoms and general obstetric precautions including but not limited to vaginal bleeding, contractions, leaking of fluid and fetal movement were reviewed in detail with the  patient. Please refer to After Visit Summary for other counseling recommendations.  Return in about 1 week (around 11/15/2017).   Rolm BookbinderAmber Meklit Cotta, DO

## 2017-11-08 NOTE — Telephone Encounter (Signed)
Patient called into front office stating she was here in the office this morning and had her cervix checked and now she is bleeding. Asked patient to provide more information about bleeding. Patient states it is like a period with dark red blood clots and is thick. Recommended she go to [facility] for evaluation. Patient verbalized understanding and reports back pain as well. Told patient that is likely contractions coming from the exam but she should come in for evaluation. Patient verbalized understanding & had no questions

## 2017-11-08 NOTE — Patient Instructions (Signed)
Braxton Hicks Contractions °Contractions of the uterus can occur throughout pregnancy, but they are not always a sign that you are in labor. You may have practice contractions called Braxton Hicks contractions. These false labor contractions are sometimes confused with true labor. °What are Braxton Hicks contractions? °Braxton Hicks contractions are tightening movements that occur in the muscles of the uterus before labor. Unlike true labor contractions, these contractions do not result in opening (dilation) and thinning of the cervix. Toward the end of pregnancy (32-34 weeks), Braxton Hicks contractions can happen more often and may become stronger. These contractions are sometimes difficult to tell apart from true labor because they can be very uncomfortable. You should not feel embarrassed if you go to the hospital with false labor. °Sometimes, the only way to tell if you are in true labor is for your health care provider to look for changes in the cervix. The health care provider will do a physical exam and may monitor your contractions. If you are not in true labor, the exam should show that your cervix is not dilating and your water has not broken. °If there are other health problems associated with your pregnancy, it is completely safe for you to be sent home with false labor. You may continue to have Braxton Hicks contractions until you go into true labor. °How to tell the difference between true labor and false labor °True labor °· Contractions last 30-70 seconds. °· Contractions become very regular. °· Discomfort is usually felt in the top of the uterus, and it spreads to the lower abdomen and low back. °· Contractions do not go away with walking. °· Contractions usually become more intense and increase in frequency. °· The cervix dilates and gets thinner. °False labor °· Contractions are usually shorter and not as strong as true labor contractions. °· Contractions are usually irregular. °· Contractions  are often felt in the front of the lower abdomen and in the groin. °· Contractions may go away when you walk around or change positions while lying down. °· Contractions get weaker and are shorter-lasting as time goes on. °· The cervix usually does not dilate or become thin. °Follow these instructions at home: °· Take over-the-counter and prescription medicines only as told by your health care provider. °· Keep up with your usual exercises and follow other instructions from your health care provider. °· Eat and drink lightly if you think you are going into labor. °· If Braxton Hicks contractions are making you uncomfortable: °? Change your position from lying down or resting to walking, or change from walking to resting. °? Sit and rest in a tub of warm water. °? Drink enough fluid to keep your urine pale yellow. Dehydration may cause these contractions. °? Do slow and deep breathing several times an hour. °· Keep all follow-up prenatal visits as told by your health care provider. This is important. °Contact a health care provider if: °· You have a fever. °· You have continuous pain in your abdomen. °Get help right away if: °· Your contractions become stronger, more regular, and closer together. °· You have fluid leaking or gushing from your vagina. °· You pass blood-tinged mucus (bloody show). °· You have bleeding from your vagina. °· You have low back pain that you never had before. °· You feel your baby’s head pushing down and causing pelvic pressure. °· Your baby is not moving inside you as much as it used to. °Summary °· Contractions that occur before labor are called Braxton   Hicks contractions, false labor, or practice contractions. °· Braxton Hicks contractions are usually shorter, weaker, farther apart, and less regular than true labor contractions. True labor contractions usually become progressively stronger and regular and they become more frequent. °· Manage discomfort from Braxton Hicks contractions by  changing position, resting in a warm bath, drinking plenty of water, or practicing deep breathing. °This information is not intended to replace advice given to you by your health care provider. Make sure you discuss any questions you have with your health care provider. °Document Released: 03/23/2017 Document Revised: 03/23/2017 Document Reviewed: 03/23/2017 °Elsevier Interactive Patient Education © 2018 [employer] ° °

## 2017-11-10 ENCOUNTER — Inpatient Hospital Stay (HOSPITAL_COMMUNITY)
Admission: AD | Admit: 2017-11-10 | Discharge: 2017-11-10 | Disposition: A | Discharge status: Home / Self Care | Payer: Medicaid Other | Source: Ambulatory Visit | Attending: Obstetrics & Gynecology | Admitting: Obstetrics & Gynecology

## 2017-11-10 ENCOUNTER — Encounter (HOSPITAL_COMMUNITY): Payer: Self-pay | Admitting: *Deleted

## 2017-11-10 DIAGNOSIS — Z3A39 39 weeks gestation of pregnancy: Secondary | ICD-10-CM | POA: Diagnosis not present

## 2017-11-10 DIAGNOSIS — Z3400 Encounter for supervision of normal first pregnancy, unspecified trimester: Secondary | ICD-10-CM

## 2017-11-10 DIAGNOSIS — O471 False labor at or after 37 completed weeks of gestation: Secondary | ICD-10-CM | POA: Diagnosis present

## 2017-11-10 NOTE — [facility] Note (Signed)
Contractions on and off since last night. Over past 3 hrs things just got worse. Denies any lof, vaginal bleeding + fm

## 2017-11-10 NOTE — Discharge Instructions (Signed)
Braxton Hicks Contractions °Contractions of the uterus can occur throughout pregnancy, but they are not always a sign that you are in labor. You may have practice contractions called Braxton Hicks contractions. These false labor contractions are sometimes confused with true labor. °What are Braxton Hicks contractions? °Braxton Hicks contractions are tightening movements that occur in the muscles of the uterus before labor. Unlike true labor contractions, these contractions do not result in opening (dilation) and thinning of the cervix. Toward the end of pregnancy (32-34 weeks), Braxton Hicks contractions can happen more often and may become stronger. These contractions are sometimes difficult to tell apart from true labor because they can be very uncomfortable. You should not feel embarrassed if you go to the hospital with false labor. °Sometimes, the only way to tell if you are in true labor is for your health care provider to look for changes in the cervix. The health care provider will do a physical exam and may monitor your contractions. If you are not in true labor, the exam should show that your cervix is not dilating and your water has not broken. °If there are other health problems associated with your pregnancy, it is completely safe for you to be sent home with false labor. You may continue to have Braxton Hicks contractions until you go into true labor. °How to tell the difference between true labor and false labor °True labor °· Contractions last 30-70 seconds. °· Contractions become very regular. °· Discomfort is usually felt in the top of the uterus, and it spreads to the lower abdomen and low back. °· Contractions do not go away with walking. °· Contractions usually become more intense and increase in frequency. °· The cervix dilates and gets thinner. °False labor °· Contractions are usually shorter and not as strong as true labor contractions. °· Contractions are usually irregular. °· Contractions  are often felt in the front of the lower abdomen and in the groin. °· Contractions may go away when you walk around or change positions while lying down. °· Contractions get weaker and are shorter-lasting as time goes on. °· The cervix usually does not dilate or become thin. °Follow these instructions at home: °· Take over-the-counter and prescription medicines only as told by your health care provider. °· Keep up with your usual exercises and follow other instructions from your health care provider. °· Eat and drink lightly if you think you are going into labor. °· If Braxton Hicks contractions are making you uncomfortable: °? Change your position from lying down or resting to walking, or change from walking to resting. °? Sit and rest in a tub of warm water. °? Drink enough fluid to keep your urine pale yellow. Dehydration may cause these contractions. °? Do slow and deep breathing several times an hour. °· Keep all follow-up prenatal visits as told by your health care provider. This is important. °Contact a health care provider if: °· You have a fever. °· You have continuous pain in your abdomen. °Get help right away if: °· Your contractions become stronger, more regular, and closer together. °· You have fluid leaking or gushing from your vagina. °· You pass blood-tinged mucus (bloody show). °· You have bleeding from your vagina. °· You have low back pain that you never had before. °· You feel your baby’s head pushing down and causing pelvic pressure. °· Your baby is not moving inside you as much as it used to. °Summary °· Contractions that occur before labor are called Braxton   Hicks contractions, false labor, or practice contractions. °· Braxton Hicks contractions are usually shorter, weaker, farther apart, and less regular than true labor contractions. True labor contractions usually become progressively stronger and regular and they become more frequent. °· Manage discomfort from Braxton Hicks contractions by  changing position, resting in a warm bath, drinking plenty of water, or practicing deep breathing. °This information is not intended to replace advice given to you by your health care provider. Make sure you discuss any questions you have with your health care provider. °Document Released: 03/23/2017 Document Revised: 03/23/2017 Document Reviewed: 03/23/2017 °Elsevier Interactive Patient Education © 2018 [employer] ° °

## 2017-11-16 ENCOUNTER — Inpatient Hospital Stay (HOSPITAL_COMMUNITY): Payer: Medicaid Other | Admitting: Anesthesiology

## 2017-11-16 ENCOUNTER — Inpatient Hospital Stay (HOSPITAL_COMMUNITY)
Admission: AD | Admit: 2017-11-16 | Discharge: 2017-11-19 | DRG: 807 | Disposition: A | Discharge status: Home / Self Care | Payer: Medicaid Other | Source: Ambulatory Visit | Attending: Obstetrics and Gynecology | Admitting: Obstetrics and Gynecology

## 2017-11-16 ENCOUNTER — Other Ambulatory Visit: Payer: Self-pay

## 2017-11-16 ENCOUNTER — Ambulatory Visit (INDEPENDENT_AMBULATORY_CARE_PROVIDER_SITE_OTHER): Payer: Medicaid Other | Admitting: Student

## 2017-11-16 ENCOUNTER — Encounter (HOSPITAL_COMMUNITY): Payer: Self-pay

## 2017-11-16 VITALS — BP 117/76 | HR 114

## 2017-11-16 DIAGNOSIS — Z3403 Encounter for supervision of normal first pregnancy, third trimester: Secondary | ICD-10-CM | POA: Diagnosis not present

## 2017-11-16 DIAGNOSIS — Z3483 Encounter for supervision of other normal pregnancy, third trimester: Secondary | ICD-10-CM | POA: Diagnosis present

## 2017-11-16 DIAGNOSIS — Z3A39 39 weeks gestation of pregnancy: Secondary | ICD-10-CM | POA: Diagnosis not present

## 2017-11-16 DIAGNOSIS — Z3400 Encounter for supervision of normal first pregnancy, unspecified trimester: Secondary | ICD-10-CM

## 2017-11-16 DIAGNOSIS — Z87891 Personal history of nicotine dependence: Secondary | ICD-10-CM

## 2017-11-16 LAB — DIFFERENTIAL
BASOS ABS: 0 10*3/uL (ref 0.0–0.1)
Basophils Relative: 0 %
Eosinophils Absolute: 0.1 10*3/uL (ref 0.0–0.7)
Eosinophils Relative: 1 %
LYMPHS ABS: 1.8 10*3/uL (ref 0.7–4.0)
Lymphocytes Relative: 21 %
MONOS PCT: 4 %
Monocytes Absolute: 0.4 10*3/uL (ref 0.1–1.0)
NEUTROS ABS: 6.4 10*3/uL (ref 1.7–7.7)
NEUTROS PCT: 74 %

## 2017-11-16 LAB — CBC
HEMATOCRIT: 36.9 % (ref 36.0–46.0)
HEMOGLOBIN: 12 g/dL (ref 12.0–15.0)
MCH: 26.6 pg (ref 26.0–34.0)
MCHC: 32.5 g/dL (ref 30.0–36.0)
MCV: 81.8 fL (ref 78.0–100.0)
Platelets: 160 10*3/uL (ref 150–400)
RBC: 4.51 MIL/uL (ref 3.87–5.11)
RDW: 16.1 % — AB (ref 11.5–15.5)
WBC: 10.4 10*3/uL (ref 4.0–10.5)

## 2017-11-16 LAB — HEPATITIS B SURFACE ANTIGEN: Hepatitis B Surface Ag: NEGATIVE

## 2017-11-16 LAB — TYPE AND SCREEN
ABO/RH(D): O POS
Antibody Screen: NEGATIVE

## 2017-11-16 LAB — ABO/RH: ABO/RH(D): O POS

## 2017-11-16 LAB — POCT FERN TEST: POCT Fern Test: NEGATIVE

## 2017-11-16 MED ORDER — ONDANSETRON HCL 4 MG/2ML IJ SOLN: 4.0000 mg | Freq: Four times a day (QID) | INTRAMUSCULAR | Status: DC | PRN

## 2017-11-16 MED ORDER — NALBUPHINE HCL 10 MG/ML IJ SOLN: 5.0000 mg | INTRAMUSCULAR | Status: DC | PRN

## 2017-11-16 MED ORDER — LACTATED RINGERS IV SOLN: 500.0000 mL | INTRAVENOUS | Status: DC | PRN

## 2017-11-16 MED ORDER — ACETAMINOPHEN 325 MG PO TABS: 650.0000 mg | ORAL_TABLET | ORAL | Status: DC | PRN

## 2017-11-16 MED ORDER — NALBUPHINE HCL 10 MG/ML IJ SOLN
5.0000 mg | Freq: Once | INTRAMUSCULAR | Status: AC
  Administered 2017-11-16: 5 mg via INTRAMUSCULAR
  Filled 2017-11-16: qty 1

## 2017-11-16 MED ORDER — EPHEDRINE 5 MG/ML INJ
10.0000 mg | INTRAVENOUS | Status: DC | PRN
  Filled 2017-11-16: qty 2

## 2017-11-16 MED ORDER — LIDOCAINE HCL (PF) 1 % IJ SOLN
INTRAMUSCULAR | Status: DC | PRN
  Administered 2017-11-16 (×2): 4 mL

## 2017-11-16 MED ORDER — PROMETHAZINE HCL 25 MG/ML IJ SOLN
12.5000 mg | Freq: Once | INTRAMUSCULAR | Status: AC
  Administered 2017-11-16: 12.5 mg via INTRAMUSCULAR
  Filled 2017-11-16: qty 1

## 2017-11-16 MED ORDER — LACTATED RINGERS IV SOLN
INTRAVENOUS | Status: DC
  Administered 2017-11-16: 20:00:00 via INTRAVENOUS

## 2017-11-16 MED ORDER — FENTANYL 2.5 MCG/ML BUPIVACAINE 1/10 % EPIDURAL INFUSION (WH - ANES): 14.0000 mL/h | INTRAMUSCULAR | Status: DC | PRN

## 2017-11-16 MED ORDER — LIDOCAINE HCL (PF) 1 % IJ SOLN
30.0000 mL | INTRAMUSCULAR | Status: DC | PRN
  Filled 2017-11-16: qty 30

## 2017-11-16 MED ORDER — SOD CITRATE-CITRIC ACID 500-334 MG/5ML PO SOLN: 30.0000 mL | ORAL | Status: DC | PRN

## 2017-11-16 MED ORDER — PHENYLEPHRINE 40 MCG/ML (10ML) SYRINGE FOR IV PUSH (FOR BLOOD PRESSURE SUPPORT)
80.0000 ug | PREFILLED_SYRINGE | INTRAVENOUS | Status: DC | PRN
  Filled 2017-11-16: qty 5

## 2017-11-16 MED ORDER — FENTANYL 2.5 MCG/ML BUPIVACAINE 1/10 % EPIDURAL INFUSION (WH - ANES)
14.0000 mL/h | INTRAMUSCULAR | Status: DC | PRN
  Administered 2017-11-16: 14 mL/h via EPIDURAL
  Filled 2017-11-16 (×2): qty 100

## 2017-11-16 MED ORDER — OXYTOCIN BOLUS FROM INFUSION
500.0000 mL | Freq: Once | INTRAVENOUS | Status: AC
  Administered 2017-11-17: 500 mL via INTRAVENOUS

## 2017-11-16 MED ORDER — DIPHENHYDRAMINE HCL 50 MG/ML IJ SOLN: 12.5000 mg | INTRAMUSCULAR | Status: DC | PRN

## 2017-11-16 MED ORDER — LACTATED RINGERS IV SOLN
500.0000 mL | Freq: Once | INTRAVENOUS | Status: AC
  Administered 2017-11-16: 500 mL via INTRAVENOUS

## 2017-11-16 MED ORDER — PHENYLEPHRINE 40 MCG/ML (10ML) SYRINGE FOR IV PUSH (FOR BLOOD PRESSURE SUPPORT)
80.0000 ug | PREFILLED_SYRINGE | INTRAVENOUS | Status: DC | PRN
  Filled 2017-11-16: qty 10
  Filled 2017-11-16: qty 5

## 2017-11-16 MED ORDER — OXYCODONE-ACETAMINOPHEN 5-325 MG PO TABS: 1.0000 | ORAL_TABLET | ORAL | Status: DC | PRN

## 2017-11-16 MED ORDER — OXYCODONE-ACETAMINOPHEN 5-325 MG PO TABS
2.0000 | ORAL_TABLET | ORAL | Status: DC | PRN
Start: 2017-11-16 — End: 2017-11-17

## 2017-11-16 MED ORDER — OXYTOCIN 40 UNITS IN LACTATED RINGERS INFUSION - SIMPLE MED
2.5000 [IU]/h | INTRAVENOUS | Status: DC
  Administered 2017-11-17: 2.5 [IU]/h via INTRAVENOUS
  Filled 2017-11-16: qty 1000

## 2017-11-16 NOTE — Anesthesia Preprocedure Evaluation (Signed)
Anesthesia Evaluation  Patient identified by MRN, date of birth, ID band Patient awake    Reviewed: Allergy & Precautions, NPO status , Patient's Chart, lab work & pertinent test results  Airway Mallampati: II  TM Distance: >3 FB Neck ROM: Full    Dental no notable dental hx.    Pulmonary neg pulmonary ROS, former smoker,    Pulmonary exam normal breath sounds clear to auscultation       Cardiovascular negative cardio ROS Normal cardiovascular exam Rhythm:Regular Rate:Normal     Neuro/Psych negative neurological ROS  negative psych ROS   GI/Hepatic negative GI ROS, Neg liver ROS,   Endo/Other  negative endocrine ROS  Renal/GU negative Renal ROS     Musculoskeletal negative musculoskeletal ROS (+)   Abdominal   Peds  Hematology negative hematology ROS (+)   Anesthesia Other Findings   Reproductive/Obstetrics negative OB ROS (+) Pregnancy                             Anesthesia Physical Anesthesia Plan  ASA: II  Anesthesia Plan: Epidural   Post-op Pain Management:    Induction:   PONV Risk Score and Plan:   Airway Management Planned:   Additional Equipment:   Intra-op Plan:   Post-operative Plan:   Informed Consent: I have reviewed the patients History and Physical, chart, labs and discussed the procedure including the risks, benefits and alternatives for the proposed anesthesia with the patient or authorized representative who has indicated his/her understanding and acceptance.     Plan Discussed with:   Anesthesia Plan Comments:         Anesthesia Quick Evaluation

## 2017-11-16 NOTE — Progress Notes (Signed)
FHR Cat 1. Ctx pattern much more regular now, q 2 minutes.  COmfortable w/epidural.  Cx 4/90/-2.  Latent  Phase labor w/improving ctx pattern. May need to augment if cx doesn't start changing.

## 2017-11-16 NOTE — Anesthesia Procedure Notes (Signed)
Epidural Patient location during procedure: OB  Staffing Anesthesiologist: Lewie LoronGermeroth, Fenix Ruppe, MD Performed: anesthesiologist   Preanesthetic Checklist Completed: patient identified, pre-op evaluation, timeout performed, IV checked, risks and benefits discussed and monitors and equipment checked  Epidural Patient position: sitting Prep: site prepped and draped and DuraPrep Patient monitoring: heart rate, continuous pulse ox and blood pressure Approach: midline Location: L2-L3 Injection technique: LOR air and LOR saline  Needle:  Needle type: Tuohy  Needle gauge: 17 G Needle length: 9 cm Needle insertion depth: 4 cm Catheter type: closed end flexible Catheter size: 19 Gauge Catheter at skin depth: 8 cm Test dose: negative  Assessment Sensory level: T8 Events: blood not aspirated, injection not painful, no injection resistance, negative IV test and no paresthesia  Additional Notes Reason for block:procedure for pain

## 2017-11-16 NOTE — H&P (Signed)
Obstetric History and Physical  Keslie Arther DamesHargrave is a 21 y.o. G1P0000 with IUP at 7731w5d presenting for early latent labor.  [facility] Pregnancy complications or risks: Patient Active Problem List   Diagnosis Date Noted  . Normal labor 11/16/2017  . Supervision of normal first pregnancy, antepartum 10/18/2017  . Limited prenatal care in third trimester 09/28/2017  . Threatened premature labor in third trimester 09/28/2017  . Indication for care in labor or delivery 07/22/2017   She plans to breastfeed, plans to bottle feed She desires nexplanon for postpartum contraception.     Prenatal Transfer Tool  Maternal Diabetes: No Maternal Ultrasounds/Referrals: Normal Fetal Ultrasounds or other Referrals:  None Maternal Substance Abuse:  No Significant Maternal Medications:  None Significant Maternal Lab Results: Lab values include: Group B Strep negative  Past Medical History:  Diagnosis Date  . Medical history non-contributory     Past Surgical History:  Procedure Laterality Date  . WISDOM TOOTH EXTRACTION Bilateral     OB History  Gravida Para Term Preterm AB Living  1 0 0 0 0 0  SAB TAB Ectopic Multiple Live Births  0 0 0 0 0    # Outcome Date GA Lbr Len/2nd Weight Sex Delivery Anes PTL Lv  1 Current               Social History   Socioeconomic History  . Marital status: Single    Spouse name: None  . Number of children: None  . Years of education: None  . Highest education level: None  Social Needs  . Financial resource strain: None  . Food insecurity - worry: None  . Food insecurity - inability: None  . Transportation needs - medical: None  . Transportation needs - non-medical: None  Occupational History  . None  Tobacco Use  . Smoking status: Former Smoker    Packs/day: 0.50    Types: Cigarettes    Last attempt to quit: 04/19/2017    Years since quitting: 0.5  . Smokeless tobacco: Never Used  Substance and Sexual Activity   . Alcohol use: No  . Drug use: Yes    Types: Marijuana  . Sexual activity: Yes    Birth control/protection: Condom  Other Topics Concern  . None  Social History Narrative  . None    History reviewed. No pertinent family history.  Medications Prior to Admission  Medication Sig Dispense Refill Last Dose  . acetaminophen (TYLENOL) 500 MG tablet Take 1,000 mg by mouth every 6 (six) hours as needed for mild pain or headache.   11/15/2017 at Unknown time  . ferrous sulfate 325 (65 FE) MG tablet Take 325 mg by mouth daily with breakfast.   11/15/2017 at Unknown time  . Prenatal Vit-Fe Fumarate-FA (MULTIVITAMIN-PRENATAL) 27-0.8 MG TABS tablet Take 1 tablet by mouth daily for 360 doses. 30 tablet 11 11/15/2017 at Unknown time    Allergies  Allergen Reactions  . Mango Flavor Hives and Swelling    Allergy to mangos    Review of Systems: Negative except for what is mentioned in HPI.  Physical Exam: BP 111/67   Pulse 77   Temp 98.2 F (36.8 C) (Oral)   Resp 18   LMP 12/30/2016 (Approximate)   SpO2 100%  CONSTITUTIONAL: Well-developed, well-nourished female in no acute distress.  HENT:  Normocephalic, atraumatic, External right and left ear normal. Oropharynx is clear and moist EYES: Conjunctivae and EOM are normal. Pupils are equal, round, and reactive to  light. No scleral icterus.  NECK: Normal range of motion, supple, no masses SKIN: Skin is warm and dry. No rash noted. Not diaphoretic. No erythema. No pallor. NEUROLOGIC: Alert and oriented to person, place, and time. Normal reflexes, muscle tone coordination. No cranial nerve deficit noted. PSYCHIATRIC: Normal mood and affect. Normal behavior. Normal judgment and thought content. CARDIOVASCULAR: Normal heart rate noted, regular rhythm RESPIRATORY: Effort and breath sounds normal, no problems with respiration noted ABDOMEN: Soft, nontender, nondistended, gravid. MUSCULOSKELETAL: Normal range of motion. No edema and no tenderness.  2+ distal pulses.  Cervical Exam: Dilatation 4cm   Effacement 90%   Station -2   Presentation: cephalic FHT:  Baseline rate 130 bpm   Variability moderate  Accelerations present   Decelerations none   Pertinent Labs/Studies:   Results for orders placed or performed during the hospital encounter of 11/16/17 (from the past 24 hour(s))  Fern Test     Status: None   Collection Time: 11/16/17  1:54 PM  Result Value Ref Range   POCT Fern Test Negative = intact amniotic membranes   CBC     Status: Abnormal   Collection Time: 11/16/17  5:50 PM  Result Value Ref Range   WBC 10.4 4.0 - 10.5 K/uL   RBC 4.51 3.87 - 5.11 MIL/uL   Hemoglobin 12.0 12.0 - 15.0 g/dL   HCT 16.136.9 09.636.0 - 04.546.0 %   MCV 81.8 78.0 - 100.0 fL   MCH 26.6 26.0 - 34.0 pg   MCHC 32.5 30.0 - 36.0 g/dL   RDW 40.916.1 (H) 81.111.5 - 91.415.5 %   Platelets 160 150 - 400 K/uL    Assessment : Betsey Arther DamesHargrave is a 21 y.o. G1P0000 at 867w5d being admitted for early latent labor.  Plan: Labor: Expectant management. Analgesia as needed. FWB: Reassuring fetal heart tracing.  GBS negative Delivery plan: Hopeful for vaginal delivery MOF: breast and bottle MOC: nexplanon  Rolm BookbinderAmber Castor Gittleman, DO

## 2017-11-16 NOTE — MAU Note (Signed)
Pt presents MAU from clinic with ctx's that started last night and has had watery discharge x 3 days. +FM

## 2017-11-16 NOTE — MAU Provider Note (Signed)
S: Ms. Andrea Weiss is a 21 y.o. G1P0 at 4323w5d  who presents to MAU today complaining of leaking of fluid x 3 days She endorses vaginal bleeding. She endorses contractions. She reports normal fetal movement.    O: BP 115/75 (BP Location: Left Arm)   Pulse 95   Temp 97.6 F (36.4 C) (Axillary)   Resp 18   LMP 12/30/2016 (Approximate)   SpO2 100%  GENERAL: Well-developed, well-nourished female in no acute distress.  HEAD: Normocephalic, atraumatic.  CHEST: Normal effort of breathing, regular heart rate ABDOMEN: Soft, nontender, gravid PELVIC: Normal external female genitalia. Vagina is pink and rugated. Cervix with normal contour, no lesions. Blood in the vagina.  negative pooling.   Cervical exam:  Dilation: 2 Effacement (%): 80 Station: -2 Presentation: Vertex Exam by:: Mathews RobinsonsHogan, H, CNM   Fetal Monitoring: Baseline: 130 Variability: moderate Accelerations: 15x15 Decelerations: none Contractions: irregular q 7-10 mins   Results for orders placed or performed during the hospital encounter of 11/16/17 (from the past 24 hour(s))  Fern Test     Status: None   Collection Time: 11/16/17  1:54 PM  Result Value Ref Range   POCT Fern Test Negative = intact amniotic membranes    1526: no cervical change, patient very uncomfortable. Will give pain medication.  1740: Patient has had pain medication. She is still very uncomfortable. Breathing through contractions. Contractions now about every 2-3 mins. Cervix: 3-4/90/-1/+bloody show.   A: SIUP at 8723w5d  Admit to labor and delivery   P: anticipate NSVD.    Armando ReichertHogan, Heather D, CNM 11/16/2017 2:43 PM

## 2017-11-16 NOTE — Progress Notes (Signed)
   PRENATAL VISIT NOTE  Subjective:  Andrea Weiss is a 21 y.o. G1P0 at 761w5d being seen today for ongoing prenatal care.  She is currently monitored for the following issues for this low-risk pregnancy and has Indication for care in labor or delivery; Limited prenatal care in third trimester; Threatened premature labor in third trimester; and Supervision of normal first pregnancy, antepartum on their problem list.  Patient reports contractions since 4 days. very painful. Marland Kitchen.   .Complaints of clear leaking of fluid mixed with blood.   .  Movement: Present. Denies leaking of fluid.   The following portions of the patient's history were reviewed and updated as appropriate: allergies, current medications, past family history, past medical history, past social history, past surgical history and problem list. Problem list updated.  Objective:   Vitals:   11/16/17 1321  BP: 117/76  Pulse: (!) 114    Fetal Status: Fetal Heart Rate (bpm): 148 Fundal Height: 39 cm Movement: Present  Presentation: Vertex  General:  Alert, oriented and cooperative. Patient is in no acute distress.  Skin: Skin is warm and dry. No rash noted.   Cardiovascular: Normal heart rate noted  Respiratory: Normal respiratory effort, no problems with respiration noted  Abdomen: Soft, gravid, appropriate for gestational age.        Pelvic: Cervical exam deferred Dilation: 2 Effacement (%): 80    Extremities: Normal range of motion.  Edema: Mild pitting, slight indentation  Mental Status:  Normal mood and affect. Normal behavior. Normal judgment and thought content.   Assessment and Plan:  Pregnancy: G1P0 at 7361w5d  1. Supervision of normal first pregnancy, antepartum -Patient to MAU for labor check and amnisure and possible IV hydration and pain meds.   Term labor symptoms and general obstetric precautions including but not limited to vaginal bleeding, contractions, leaking of fluid and fetal movement were reviewed in  detail with the patient. Please refer to After Visit Summary for other counseling recommendations.  Return in about 1 week (around 11/23/2017) for OB visit.   Marylene LandKathryn Lorraine Najmah Carradine, CNM

## 2017-11-17 ENCOUNTER — Encounter (HOSPITAL_COMMUNITY): Payer: Self-pay

## 2017-11-17 DIAGNOSIS — Z3A39 39 weeks gestation of pregnancy: Secondary | ICD-10-CM

## 2017-11-17 LAB — CBC
HCT: 35.5 % — ABNORMAL LOW (ref 36.0–46.0)
Hemoglobin: 11.5 g/dL — ABNORMAL LOW (ref 12.0–15.0)
MCH: 26.3 pg (ref 26.0–34.0)
MCHC: 32.4 g/dL (ref 30.0–36.0)
MCV: 81.2 fL (ref 78.0–100.0)
PLATELETS: 150 10*3/uL (ref 150–400)
RBC: 4.37 MIL/uL (ref 3.87–5.11)
RDW: 16 % — AB (ref 11.5–15.5)
WBC: 5.9 10*3/uL (ref 4.0–10.5)

## 2017-11-17 LAB — RPR: RPR Ser Ql: NONREACTIVE

## 2017-11-17 LAB — HIV ANTIBODY (ROUTINE TESTING W REFLEX): HIV Screen 4th Generation wRfx: NONREACTIVE

## 2017-11-17 LAB — RUBELLA SCREEN: Rubella: 3.7 index (ref >0.99)

## 2017-11-17 MED ORDER — ZOLPIDEM TARTRATE 5 MG PO TABS: 5.0000 mg | ORAL_TABLET | Freq: Every evening | ORAL | Status: DC | PRN

## 2017-11-17 MED ORDER — DIBUCAINE 1 % RE OINT: 1.0000 "application " | TOPICAL_OINTMENT | RECTAL | Status: DC | PRN

## 2017-11-17 MED ORDER — DIPHENHYDRAMINE HCL 25 MG PO CAPS: 25.0000 mg | ORAL_CAPSULE | Freq: Four times a day (QID) | ORAL | Status: DC | PRN

## 2017-11-17 MED ORDER — SIMETHICONE 80 MG PO CHEW: 80.0000 mg | CHEWABLE_TABLET | ORAL | Status: DC | PRN

## 2017-11-17 MED ORDER — IBUPROFEN 600 MG PO TABS
600.0000 mg | ORAL_TABLET | Freq: Four times a day (QID) | ORAL | Status: DC
  Administered 2017-11-17 – 2017-11-19 (×7): 600 mg via ORAL
  Filled 2017-11-17 (×7): qty 1

## 2017-11-17 MED ORDER — ONDANSETRON HCL 4 MG/2ML IJ SOLN: 4.0000 mg | INTRAMUSCULAR | Status: DC | PRN

## 2017-11-17 MED ORDER — ACETAMINOPHEN 325 MG PO TABS: 650.0000 mg | ORAL_TABLET | ORAL | Status: DC | PRN

## 2017-11-17 MED ORDER — WITCH HAZEL-GLYCERIN EX PADS: 1.0000 "application " | MEDICATED_PAD | CUTANEOUS | Status: DC | PRN

## 2017-11-17 MED ORDER — PRENATAL MULTIVITAMIN CH
1.0000 | ORAL_TABLET | Freq: Every day | ORAL | Status: DC
  Administered 2017-11-18: 1 via ORAL
  Filled 2017-11-17: qty 1

## 2017-11-17 MED ORDER — COCONUT OIL OIL
1.0000 "application " | TOPICAL_OIL | Status: DC | PRN
  Filled 2017-11-17: qty 120

## 2017-11-17 MED ORDER — OXYCODONE HCL 5 MG PO TABS: 5.0000 mg | ORAL_TABLET | ORAL | Status: DC | PRN

## 2017-11-17 MED ORDER — SENNOSIDES-DOCUSATE SODIUM 8.6-50 MG PO TABS
2.0000 | ORAL_TABLET | ORAL | Status: DC
  Administered 2017-11-17 – 2017-11-18 (×2): 2 via ORAL
  Filled 2017-11-17 (×2): qty 2

## 2017-11-17 MED ORDER — BENZOCAINE-MENTHOL 20-0.5 % EX AERO: 1.0000 "application " | INHALATION_SPRAY | CUTANEOUS | Status: DC | PRN

## 2017-11-17 MED ORDER — TETANUS-DIPHTH-ACELL PERTUSSIS 5-2.5-18.5 LF-MCG/0.5 IM SUSP: 0.5000 mL | Freq: Once | INTRAMUSCULAR | Status: DC

## 2017-11-17 MED ORDER — ONDANSETRON HCL 4 MG PO TABS: 4.0000 mg | ORAL_TABLET | ORAL | Status: DC | PRN

## 2017-11-17 NOTE — Anesthesia Postprocedure Evaluation (Signed)
Anesthesia Post Note  Patient: Andrea Weiss  Procedure(s) Performed: AN AD HOC LABOR EPIDURAL     Patient location during evaluation: Mother Baby Anesthesia Type: Epidural Level of consciousness: awake and alert and oriented Pain management: satisfactory to patient Vital Signs Assessment: post-procedure vital signs reviewed and stable Respiratory status: spontaneous breathing and nonlabored ventilation Cardiovascular status: stable Postop Assessment: no headache, no backache, no signs of nausea or vomiting, adequate PO intake and patient able to bend at knees (patient up walking) Anesthetic complications: no    Last Vitals:  Vitals:   11/17/17 1400 11/17/17 1700  BP: 125/82 122/69  Pulse: 72 66  Resp: 18 18  Temp: 36.9 C 37.5 C  SpO2: 100% 100%    Last Pain:  Vitals:   11/17/17 1700  TempSrc: Oral  PainSc:    Pain Goal: Patients Stated Pain Goal: 2 (11/16/17 1949)               Madison HickmanGREGORY,Ronnett Pullin

## 2017-11-17 NOTE — Progress Notes (Signed)
Vitals:   11/17/17 0530 11/17/17 0600  BP: (!) 90/53 (!) 92/55  Pulse: 83 93  Resp: 16 18  Temp: 98.6 F (37 C)   SpO2:     Comfortable w/epidural.  Ctx q 2-4 minutes, cx 6/90/-1 last check.  FHR 135, Cat 1.  Continue expectant management

## 2017-11-17 NOTE — Progress Notes (Signed)
Epidural removed charted incorrectly for 1139. Epidural removed at 1239.

## 2017-11-17 NOTE — Progress Notes (Signed)
Patient ID: Larae GroomsDynashia Chaudhuri, female   DOB: 04/01/1996, 21 y.o.   MRN: [id number]  Comfortable w/ epidural  BP 106/79, other VSS FHR 130s, +accels, no decels Ctx q 3-4 mins, spont Cx 6/80/-1; AROM for pink tinged fluid  IUP@term  GBS neg Early active labor  Hopeful that with AROM she will get into active labor, but will use Pit prn  Cam HaiSHAW, KIMBERLY CNM 11/17/2017 10:06 AM

## 2017-11-17 NOTE — Lactation Note (Addendum)
This note was copied from a baby's chart. Lactation Consultation Note  Patient Name: Andrea Larae GroomsDynashia Weiss EAVWU'JToday's Date: 11/17/2017 Reason for consult: Initial assessment;Term;1st time breastfeeding;Primapara   Initial assessment with first time mom of 6 hour old term infant. Infant with 1 BF for 20 minutes per mom, 3 BF attempts, and 2 stools since birth. Infant weight 7 lb 6.2 oz. LATCH scores 5-9. Infant in deep sleep in mom's arms.   Mom reports infant has only fed once since birth. Told mom this is not unusual for some babies. Enc mom to put infant STS and feed with feeding cues 8-12 x in 24 hours. Enc mom to hand express before and after latch. Enc mom to exclusively BF for at least the first 2 weeks to establish a good supply. Mom reports she plans to breast and formula feed infant.   Reviewed BF basics, pillow and head support, hand expression, stomach size of the NB, NB nutritional needs, spoon feeding as needed, benefits of BF for mom and infant,  breast massage/compression with feeding, STS, resting when infant is resting, and cluster feeding.   Bf Resources Handout and LC Brochure given, mom informed of IP/OP Services, BF Support Groups and LC phone #. [facility] and does not have a pump at home.   Mom reports she has no questions/concerns at this time. MGM in the room to help mom. Enc mom to call out for feeding assistance as needed.     Maternal Data Formula Feeding for Exclusion: Yes Reason for exclusion: Mother's choice to formula and breast feed on admission Has patient been taught Hand Expression?: Yes Does the patient have breastfeeding experience prior to this delivery?: No  Feeding Feeding Type: Breast Fed  LATCH Score                   Interventions Interventions: Breast feeding basics reviewed;Support pillows;Skin to skin;Hand express;Breast compression  Lactation Tools Discussed/Used WIC Program: Yes   Consult Status Consult Status:  Follow-up Date: 11/18/17 Follow-up type: In-patient    Andrea FloodSharon S Hice 11/17/2017, 5:42 PM

## 2017-11-17 NOTE — Anesthesia Pain Management Evaluation Note (Signed)
  CRNA Pain Management Visit Note  Patient: Andrea Weiss, 21 y.o., female  "Hello [facility]. We have an anesthesia team available at all times to provide care throughout the hospital, including epidural management and anesthesia for C-section. I don't know your plan for the delivery whether it a natural birth, water birth, IV sedation, nitrous supplementation, doula or epidural, but we want to meet your pain goals."   1.Was your pain managed to your expectations on prior hospitalizations?   No prior hospitalizations  2.What is your expectation for pain management during this hospitalization?     Epidural  3.How can we help you reach that goal? unsure  Record the patient's initial score and the patient's pain goal.   Pain: 0  Pain Goal: 8 [facility] wants you to be able to say your pain was always managed very well.  Cephus ShellingBURGER,Demiyah Fischbach 11/17/2017

## 2017-11-18 NOTE — Progress Notes (Signed)
During assessment, noted scars bilaterally on pt's forearms. Inquired about the scars, pt stated that the scars on one arm came from "running through the woods" and the scar on the other "had been there since she was a baby." Pt showed no signs of distress when describing the scars.

## 2017-11-18 NOTE — Progress Notes (Signed)
CSW assessment completed.  No barriers to discharge.  Full documentation to follow. 

## 2017-11-18 NOTE — Clinical Social Work Maternal (Signed)
CLINICAL SOCIAL WORK MATERNAL/CHILD NOTE  Patient Details  Name: Andrea Weiss MRN: [id number] Date of Birth: 04/26/1996  Date:  11/18/2017  Clinical Social Worker Initiating Note:  Winda Summerall, LCSW Date/Time: Initiated:  11/18/17/1200     Child's Name:  Christopher "Roman" Mizuno   Biological Parents:  Mother(Vina Detamore )   Need for Interpreter:  None   Reason for Referral:  Behavioral Health Concerns, Current Substance Use/Substance Use During Pregnancy    Address:  [address] [place] [address]    Phone number:  [phone] (home)     Additional phone number:   Household Members/Support Persons (HM/SP):   Household Member/Support Person 1   HM/SP Name Relationship DOB or Age  HM/SP -1 Yvette Maulden mother    HM/SP -2        HM/SP -3        HM/SP -4        HM/SP -5        HM/SP -6        HM/SP -7        HM/SP -8          Natural Supports (not living in the home):  Church, Friends, Immediate Family, Extended Family(MOB feels she has a good support system and states she and her mother are very close.  FOB is involved, but they are not in a relationship at this time.)   Professional Supports: None   Employment:     Type of Work:     Education:      Homebound arranged:    Financial Resources:  Medicaid   Other Resources:      Cultural/Religious Considerations Which May Impact Care: None stated.  MOB's facesheet notes religion as Catholic.  She reports that church is important to her.  Strengths:  Ability to meet basic needs , Home prepared for child , Pediatrician chosen   Psychotropic Medications:         Pediatrician:    [place] area  Pediatrician List:   [facility]  [place]    [place]    [place]    [place]    [place]      Pediatrician Fax Number:    Risk Factors/Current Problems:  Mental Health Concerns , Substance Use    Cognitive  State:  Able to Concentrate , Alert , Linear Thinking , Insightful , Goal Oriented    Mood/Affect:  Euthymic , Calm , Interested , Relaxed    CSW Assessment: CSW met with MOB and MGM in MOB's first floor room/141 to offer support and complete assessment due to hx of depression and marijuana use.  MOB and MGM were very pleasant and welcoming of CSW's visit, and both easy to engage and equally involved in the conversation until MGM decided to step out to give MOB some time to talk privately with CSW. MOB states that she had some emotional distress related to the breakup with FOB around 4-5 months of pregnancy, but states that "it was a great decision" and she is happy she is through that period of time.  She states she "wasn't in my right mind with him (FOB)" and "it wasn't a good environment."  MOB reports that they are cordial and will be attempting to co-parent baby.  She states FOB has been to the hospital and the visit went well.  She states that she was living with FOB in [place] prior to the breakup and moved back   in with her mother once she and FOB ended their relationship.  She reports that her mother is a huge support for her.  MGM states that she knows her daughter well and makes sure she checks in with her on an emotional level on a regular basis.  MOB and MGM identified positive coping mechanisms such as shopping, going out to eat, keeping busy, reading, praying, listening to music and going to church.  MOB states she can easily get her mind off things that make her sad when she stays busy.  She reports that she suffered from depression as a teenager, but her mother added that they had experienced "back to back deaths in the family."  MOB reports no symptoms of depression in the past few years.  Both MOB and MGM were attentive to information given about PMADs and receptive to resources offered. MGM had left the room when CSW discussed THC use.  MOB states she has not smoked marijuana since  her breakup with FOB.  She states she does not plan to use again and that her mother would not allow it while she lives there anyway.  MOB was understanding of hospital drug screen policy and mandated reporting for positive screens.  Baby's UDS is negative and CDS is pending.    CSW Plan/Description:  No Further Intervention Required/No Barriers to Discharge, Sudden Infant Death Syndrome (SIDS) Education, Perinatal Mood and Anxiety Disorder (PMADs) Education, Hospital Drug Screen Policy Information, CSW Will Continue to Monitor Umbilical Cord Tissue Drug Screen Results and Make Report if Warranted    Jamee Keach Elizabeth, LCSW 11/18/2017, 2:03 PM 

## 2017-11-18 NOTE — Progress Notes (Signed)
Post Partum Day #1 Subjective: no complaints, up ad lib and tolerating PO; breast and bottlefeeding; desires Nexplanon for contraception  Objective: Blood pressure 113/72, pulse 82, temperature 98.4 F (36.9 C), temperature source Oral, resp. rate 18, height 5\' 4"  (1.626 m), weight 55.7 kg (122 lb 14.4 oz), last menstrual period 12/30/2016, SpO2 100 %, unknown if currently breastfeeding.  Physical Exam:  General: alert, cooperative and no distress Lochia: appropriate Uterine Fundus: firm DVT Evaluation: No evidence of DVT seen on physical exam.  Recent Labs    11/16/17 1750 11/17/17 1157  HGB 12.0 11.5*  HCT 36.9 35.5*    Assessment/Plan: Plan for discharge tomorrow   LOS: 2 days   Cam HaiSHAW, Vena Bassinger CNM 11/18/2017, 8:38 AM

## 2017-11-18 NOTE — Lactation Note (Signed)
This note was copied from a baby's chart. Lactation Consultation Note  Patient Name: [name] UJWJX'BToday's Date: 11/18/2017   Mom reports baby is latching easily and feeding well.  No questions or concerns at this time.  Encouraged to call for assist prn.   Maternal Data    Feeding Feeding Type: Breast Fed Length of feed: 25 min  LATCH Score                   Interventions    Lactation Tools Discussed/Used     Consult Status      Huston FoleyMOULDEN, Denece Shearer S 11/18/2017, 6:39 PM

## 2017-11-19 MED ORDER — IBUPROFEN 600 MG PO TABS: 600.0000 mg | ORAL_TABLET | Freq: Four times a day (QID) | ORAL | 0 refills | Status: DC

## 2017-11-19 NOTE — Lactation Note (Signed)
This note was copied from a baby's chart. Lactation Consultation Note  Patient Name: Andrea Weiss EAVWU'JToday's Date: 11/19/2017 Reason for consult: Follow-up assessment;Nipple pain/trauma  Visited with Mom on day of discharge, baby 2247 hrs old.  [facility]  Baby has been exclusively breastfeeding.  Baby in crib sucking on pacifier while Mom getting her things together for discharge.  Pacifier use discussed.  Mom states baby is wanting to cluster feed, and her nipples are sore today.  Mom wearing Comfort Gels.  Offered to assist and assess positioning and latch.  Talked about benefits of STS when feeding.  Baby latched easily with a little help in cross cradle hold on right breast.  Multiple swallows identified for Mom.  Mom's hands adjusted on breast to facilitate a deeper areolar grasp to breast.  Mom stated the latch didn't hurt at all.   Reviewed basics.  Manual breast pump assembled and instructed on use while baby on breast.  Engorgement prevention and treatment discussed. Mom aware of OP lactation services available to her.  Encouraged to call prn.  Feeding Feeding Type: Breast Fed  LATCH Score Latch: Grasps breast easily, tongue down, lips flanged, rhythmical sucking.  Audible Swallowing: Spontaneous and intermittent  Type of Nipple: Everted at rest and after stimulation  Comfort (Breast/Nipple): Filling, red/small blisters or bruises, mild/mod discomfort  Hold (Positioning): Assistance needed to correctly position infant at breast and maintain latch.  LATCH Score: 8  Interventions Interventions: Breast feeding basics reviewed;Assisted with latch;Breast massage;Hand express;Breast compression;Adjust position;Support pillows;Position options;Expressed milk;Hand pump  Lactation Tools Discussed/Used WIC Program: Yes Pump Review: Setup, frequency, and cleaning Initiated by:: Johny Blameraroline Emaleigh Guimond RN IBCLC Date initiated:: 11/19/17   Consult  Status Consult Status: Complete Date: 11/19/17 Follow-up type: Call as needed    [name], [name] 11/19/2017, 10:24 AM

## 2017-11-19 NOTE — Discharge Summary (Signed)
OB Discharge Summary  Patient Name: Andrea Weiss DOB: 10/11/1996 MRN: [id number]  Date of admission: 11/16/2017 Delivering MD: Conan BowensAVIS, KELLY M   Date of discharge: 11/19/2017  Admitting diagnosis: 39.5WKS PRODROMAL LABOR Intrauterine pregnancy: 6525w6d     Secondary diagnosis:Active Problems:   Normal labor  Additional problems:none     Discharge diagnosis: Term Pregnancy Delivered                                                                     Post partum procedures:n/a  Augmentation: none  Complications: None  Hospital course:  Onset of Labor With Vaginal Delivery     21 y.o. yo G1P1001 at 925w6d was admitted in Latent Labor on 11/16/2017. Patient had an uncomplicated labor course as follows:  Membrane Rupture Time/Date: 9:39 AM ,11/17/2017   Intrapartum Procedures: Episiotomy: None [1]                                         Lacerations:  Periurethral [8];Vaginal [6]  Patient had a delivery of a Viable infant. 11/17/2017  Information for the patient's newborn:  Jamey RipaHargrave, Boy [name] [[id number]][[id number]]  Delivery Method: Vaginal, Vacuum (Extractor)(Filed from Delivery Summary)    Pateint had an uncomplicated postpartum course.  She is ambulating, tolerating a regular diet, passing flatus, and urinating well. Patient is discharged home in stable condition on 11/19/17.   Physical exam  Vitals:   11/18/17 0000 11/18/17 0615 11/18/17 1901 11/19/17 0632  BP: 119/60 113/72 110/72 125/84  Pulse: 60 82 82 (!) 59  Resp: 18 18 18 16   Temp: 98.1 F (36.7 C) 98.4 F (36.9 C) 98.2 F (36.8 C) 98.2 F (36.8 C)  TempSrc: Oral Oral  Oral  SpO2:      Weight:  122 lb 14.4 oz (55.7 kg)  128 lb 4.8 oz (58.2 kg)  Height:       General: alert, cooperative and no distress Lochia: appropriate Uterine Fundus: firm Incision: N/A DVT Evaluation: No evidence of DVT seen on physical exam. Labs: Lab Results  Component Value Date   WBC 5.9 11/17/2017   HGB 11.5 (L) 11/17/2017    HCT 35.5 (L) 11/17/2017   MCV 81.2 11/17/2017   PLT 150 11/17/2017   CMP Latest Ref Rng & Units 06/04/2015  Glucose 65 - 99 mg/dL 91  BUN 6 - 20 mg/dL 13  Creatinine 0.860.44 - 5.781.00 mg/dL 4.690.60  Sodium 629135 - 528145 mmol/L 137  Potassium 3.5 - 5.1 mmol/L 3.7  Chloride 101 - 111 mmol/L 99(L)    Discharge instruction: per After Visit Summary and "Baby and Me Booklet".  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   ferrous sulfate 325 (65 FE) MG tablet Take 325 mg by mouth daily with breakfast.   ibuprofen 600 MG tablet Commonly known  as:  ADVIL,MOTRIN Take 1 tablet (600 mg total) by mouth every 6 (six) hours.   multivitamin-prenatal 27-0.8 MG Tabs tablet Take 1 tablet by mouth daily for 360 doses.       Diet: routine diet  Activity: Advance as tolerated. Pelvic rest for 6 weeks.   Outpatient follow up:6 weeks Follow up Appt:No future appointments. Follow up visit: No Follow-up on file.  Postpartum contraception: Nexplanon  Newborn Data: Live born female  Birth Weight: 7 lb 6.2 oz (3350 g) APGAR: 5, 9  Newborn Delivery   Birth date/time:  11/17/2017 10:59:00 Delivery type:  Vaginal, Vacuum (Extractor)     Baby Feeding: Bottle and Breast Disposition:home with mother   11/19/2017 Wyvonnia DuskyMarie Rajah Lamba, CNM

## 2017-12-27 ENCOUNTER — Ambulatory Visit (INDEPENDENT_AMBULATORY_CARE_PROVIDER_SITE_OTHER): Payer: Medicaid Other | Admitting: Student

## 2017-12-27 ENCOUNTER — Encounter: Payer: Self-pay | Admitting: Student

## 2017-12-27 DIAGNOSIS — O2686 Pruritic urticarial papules and plaques of pregnancy (PUPPP): Secondary | ICD-10-CM

## 2017-12-27 MED ORDER — CETIRIZINE HCL 10 MG PO TABS: 10.0000 mg | ORAL_TABLET | Freq: Every day | ORAL | 2 refills | Status: DC

## 2017-12-27 MED ORDER — TRIAMCINOLONE ACETONIDE 0.1 % EX CREA: 1.0000 "application " | TOPICAL_CREAM | Freq: Two times a day (BID) | CUTANEOUS | 0 refills | Status: DC

## 2017-12-27 MED ORDER — HYDROCORTISONE 0.5 % EX CREA: 1.0000 "application " | TOPICAL_CREAM | Freq: Two times a day (BID) | CUTANEOUS | 0 refills | Status: DC

## 2017-12-27 NOTE — Progress Notes (Signed)
Subjective:     Andrea Weiss is a 22 y.o. female who presents for a postpartum visit. She is 6 weeks postpartum following a spontaneous vaginal delivery. I have fully reviewed the prenatal and intrapartum course. The delivery was at 39 gestational weeks. Outcome: spontaneous vaginal delivery. Anesthesia: epidural. Postpartum course has been complicated by hives that started two weeks ago as soon as she stopped breastfeeding.  Sometimes they itch; they come and go. She sees them more when she gets out of the shower (she notices them more). She has not taken anything for it.  Baby's course has been uneventful. Baby is feeding by bottle - Enfamil with Iron. Bleeding red. Bowel function is normal. Bladder function is normal. Patient is sexually active. Contraception method is Nexplanon. Postpartum depression screening: negative.  The following portions of the patient's history were reviewed and updated as appropriate: allergies, current medications, past family history, past medical history, past social history, past surgical history and problem list.  Review of Systems Pertinent items are noted in HPI.    Complaints of itching and bumps; on her chest and arms. They are not painful; more like small pimples.  Patient has been having unprotected intercourse.   Objective:    LMP 12/30/2016 (Approximate)   General:  alert, cooperative and no distress   Breasts:  inspection negative, no nipple discharge or bleeding, no masses or nodularity palpable  Lungs: clear to auscultation bilaterally  Heart:  regular rate and rhythm, S1, S2 normal, no murmur, click, rub or gallop  Abdomen: soft, non-tender; bowel sounds normal; no masses,  no organomegaly   Vulva:  normal  Vagina: not evaluated  Cervix:  not evaluated  Corpus: not examined  Adnexa:  not evaluated  Rectal Exam: Not performed.        Assessment:    Healthy postpartum exam. Pap smear not done at today's visit.  Patient had negative  pregnancy test today but she has been having unprotected sex.   Plan:    1. Contraception: Nexplanon 2. Patient counseled about the risks of unprotected intercourse; she will come back in two weeks for repeat UPT, Nexplanon and pap smear.  3. Kenalog .1% plus zyrtec for PUPP.  3. Follow up in: 2 weeks or as needed.   Luna KitchensKathryn Hunt Zajicek

## 2017-12-27 NOTE — Patient Instructions (Signed)
Pruritic Urticarial Papules and Plaques of Pregnancy °When you are pregnant, your body changes in many ways. That includes the skin. Rashes sometimes develop. One skin rash that can happen during pregnancy is called pruritic urticarial papules and plaques of pregnancy (PUPPP). The small red bumps sometimes form large plaques. These are very itchy. The rash usually appears in the last few weeks of pregnancy during the third trimester. Sometimes, it can occur shortly after giving birth. It goes away shortly after your baby is born. It does not harm you or your baby and will not leave scars on your skin. PUPPP is most common in first pregnancies or in those involving more than one baby. It usually will not return during later pregnancies. °What are the causes? °The exact cause is unknown. However, it may be related to your skin stretching rapidly due to pregnancy. °What are the signs or symptoms? °PUPPP symptoms include a very itchy rash. The rash often looks red and raised and is most often seen on the abdomen. It can spread to the legs, thighs, or arms. Sometimes tiny blisters form in the center of the rash patches. The skin around the rash is often pale. °How is this diagnosed? °To decide if you have PUPPP, your health care provider will perform a physical exam and ask questions about your symptoms. He or she may order blood tests to rule out other causes of the rash. °How is this treated? °The goal is to stop the itching and keep the rash from spreading. Usually, a cream is used to do this. However, treatment varies. Common options include medicines that relieve or lessen itching. Some medicines may be in the form of a cream or ointment, while others you may take by mouth (orally). The medicines are either corticosteroids or antihistamines. Treatment helps nearly all women with this rash. The creams, ointments, or pills should make your skin feel better fairly quickly. °Follow these instructions at home: °· Only  take over-the-counter or prescription medicines as directed by your health care provider. °· Apply any creams as directed by your health care provider. °· Do not scratch the rash. °· Wear loose clothing. °· Keep all follow-up appointments with your health care provider. °Contact a health care provider if: °· The itching does not go away after treatment. °· Your rash continues to spread. °· You are unable to sleep because of the irritation. °This information is not intended to replace advice given to you by your health care provider. Make sure you discuss any questions you have with your health care provider. °Document Released: 02/01/2010 Document Revised: 04/14/2016 Document Reviewed: 04/21/2013 °Elsevier Interactive Patient Education © 2018 Elsevier Inc. ° °

## 2018-01-23 ENCOUNTER — Other Ambulatory Visit (HOSPITAL_COMMUNITY)
Admission: RE | Admit: 2018-01-23 | Discharge: 2018-01-23 | Disposition: A | Discharge status: Home / Self Care | Payer: Medicaid Other | Source: Ambulatory Visit | Attending: Family Medicine | Admitting: Family Medicine

## 2018-01-23 ENCOUNTER — Encounter: Payer: Self-pay | Admitting: Obstetrics and Gynecology

## 2018-01-23 ENCOUNTER — Ambulatory Visit (INDEPENDENT_AMBULATORY_CARE_PROVIDER_SITE_OTHER): Payer: Medicaid Other | Admitting: Obstetrics and Gynecology

## 2018-01-23 VITALS — BP 122/74 | HR 88 | Ht 64.0 in | Wt 114.0 lb

## 2018-01-23 DIAGNOSIS — R87810 Cervical high risk human papillomavirus (HPV) DNA test positive: Secondary | ICD-10-CM | POA: Insufficient documentation

## 2018-01-23 DIAGNOSIS — R87610 Atypical squamous cells of undetermined significance on cytologic smear of cervix (ASC-US): Secondary | ICD-10-CM | POA: Diagnosis not present

## 2018-01-23 DIAGNOSIS — Z309 Encounter for contraceptive management, unspecified: Secondary | ICD-10-CM

## 2018-01-23 DIAGNOSIS — Z01419 Encounter for gynecological examination (general) (routine) without abnormal findings: Secondary | ICD-10-CM | POA: Insufficient documentation

## 2018-01-23 DIAGNOSIS — Z30017 Encounter for initial prescription of implantable subdermal contraceptive: Secondary | ICD-10-CM | POA: Diagnosis present

## 2018-01-23 DIAGNOSIS — Z308 Encounter for other contraceptive management: Secondary | ICD-10-CM

## 2018-01-23 DIAGNOSIS — Z124 Encounter for screening for malignant neoplasm of cervix: Secondary | ICD-10-CM

## 2018-01-23 LAB — POCT PREGNANCY, URINE: PREG TEST UR: NEGATIVE

## 2018-01-23 MED ORDER — ETONOGESTREL 68 MG ~~LOC~~ IMPL
68.0000 mg | DRUG_IMPLANT | Freq: Once | SUBCUTANEOUS | Status: AC
  Administered 2018-01-23: 68 mg via SUBCUTANEOUS

## 2018-01-23 NOTE — Progress Notes (Signed)
    GYNECOLOGY OFFICE PROCEDURE NOTE  Andrea Weiss is a 22 y.o. G1P1001 here for Nexplanon insertion.  Pap smear also completed today.  No other gynecologic concerns. Denies unprotected intercourse within the last 14 days. UPT: negative  Reviewed risks of insertion of implant including risk of infection, bleeding, damage to surrounding tissues and organs, migration of implant, difficult removal. She verbalizes understanding and affirms desire to proceed. Consent signed.   Nexplanon Insertion Procedure Patient identified, informed consent performed, consent signed.   Patient does understand that irregular bleeding is a very common side effect of this medication. She was advised to have backup contraception for one week after placement. Pregnancy test in clinic today was negative.  An adequate timeout was performed.  Patient's left arm was prepped and draped in the usual sterile fashion. The ruler used to measure and mark insertion area.  Patient was prepped with alcohol swab and then injected with 3 ml of 1% lidocaine.  She was prepped with betadine, Nexplanon removed from packaging,  Device confirmed in needle, then inserted full length of needle and withdrawn per handbook instructions. Nexplanon was able to palpated in the patient's arm; patient palpated the insert herself. There was minimal blood loss.  Patient insertion site covered with guaze and a pressure bandage to reduce any bruising.  The patient tolerated the procedure well and was given post procedure instructions.    Device Info Exp: 07/05/2020 Lot#: [id number]  K. Therese SarahMeryl Yousef Huge, M.D. Attending Obstetrician & Gynecologist, [facility], [facility]

## 2018-01-26 ENCOUNTER — Encounter: Payer: Self-pay | Admitting: *Deleted

## 2018-01-27 LAB — CYTOLOGY - PAP
CHLAMYDIA, DNA PROBE: NEGATIVE
HPV: DETECTED — AB
Neisseria Gonorrhea: NEGATIVE

## 2018-02-08 ENCOUNTER — Telehealth: Payer: Self-pay

## 2018-02-08 ENCOUNTER — Telehealth: Payer: Self-pay | Admitting: General Practice

## 2018-02-08 NOTE — Telephone Encounter (Signed)
Patient called and made aware of need for colposcopy. Explained procedure to patient and answered her questions. Patient scheduled for 03-14-18 at 930a. Armandina StammerJennifer Howard RNBSN

## 2018-02-08 NOTE — Telephone Encounter (Signed)
Call patient to inform her of pap smear results and need for a colposcopy. Spoke with her mother who stated she will give her the message to call and schedule appointment.

## 2018-02-08 NOTE — Telephone Encounter (Signed)
-----   Message from Marti SleighSctaria J Malloy, [place] sent at 02/08/2018  8:53 AM EDT ----- Contact: [phone] Hi Ladies:  This patient is scheduled for Colpo on 03/14/18 @ 0935.  She would like for someone to give her a call explaining the procedure.   Thank you

## 2018-02-08 NOTE — Telephone Encounter (Signed)
Called patient to explain to her about having a colposcopy perform and what to expect. Patient voiced understanding at this time.

## 2018-02-08 NOTE — Telephone Encounter (Signed)
-----   Message from Conan BowensKelly M Davis, MD sent at 02/05/2018 11:15 AM EDT ----- ASC-H, positive HPV, needs colposcopy, please call and schedule.

## 2018-02-08 NOTE — Telephone Encounter (Signed)
Called patient to schedule Colpo for 03/14/18 at 9:35am.

## 2018-03-14 ENCOUNTER — Other Ambulatory Visit (HOSPITAL_COMMUNITY)
Admission: RE | Admit: 2018-03-14 | Discharge: 2018-03-14 | Disposition: A | Discharge status: Home / Self Care | Payer: Medicaid Other | Source: Ambulatory Visit | Attending: Medical | Admitting: Medical

## 2018-03-14 ENCOUNTER — Encounter: Payer: Self-pay | Admitting: Medical

## 2018-03-14 ENCOUNTER — Ambulatory Visit (INDEPENDENT_AMBULATORY_CARE_PROVIDER_SITE_OTHER): Payer: Medicaid Other | Admitting: Medical

## 2018-03-14 ENCOUNTER — Encounter: Payer: Self-pay | Admitting: *Deleted

## 2018-03-14 VITALS — BP 122/79 | HR 66 | Wt 112.0 lb

## 2018-03-14 DIAGNOSIS — R87611 Atypical squamous cells cannot exclude high grade squamous intraepithelial lesion on cytologic smear of cervix (ASC-H): Secondary | ICD-10-CM | POA: Diagnosis present

## 2018-03-14 DIAGNOSIS — N870 Mild cervical dysplasia: Secondary | ICD-10-CM | POA: Insufficient documentation

## 2018-03-14 LAB — POCT PREGNANCY, URINE: Preg Test, Ur: NEGATIVE

## 2018-03-14 NOTE — Progress Notes (Signed)
    GYNECOLOGY CLINIC COLPOSCOPY PROCEDURE NOTE  Ms. Larae GroomsDynashia Kachel is a 22 y.o. G1P1001 here for colposcopy for ASC cannot exclude high grade lesion [facility] pap smear on 01/23/18. Discussed role for HPV in cervical dysplasia, need for surveillance.  Patient given informed consent, signed copy in the chart, time out was performed.  Placed in lithotomy position. Cervix viewed with speculum and colposcope after application of acetic acid.   Colposcopy adequate? Yes  acetowhite lesion(s) noted at 9 o'clock and between 3-6 o'clock; biopsies obtained at 9 and 4 o'clock.  ECC specimen obtained. All specimens were labelled and sent to pathology.   Patient was given post procedure instructions.  Will follow up pathology and manage accordingly.  Routine preventative health maintenance measures emphasized.   Marny LowensteinWenzel, Alexsandria Kivett N, PA-C 03/14/2018 10:51 AM

## 2018-03-14 NOTE — Patient Instructions (Signed)
Colposcopy, Care After  This sheet gives you information about how to care for yourself after your procedure. Your doctor may also give you more specific instructions. If you have problems or questions, contact your doctor.  What can I expect after the procedure?  If you did not have a tissue sample removed (did not have a biopsy), you may only have some spotting for a few days. You can go back to your normal activities.  If you had a tissue sample removed, it is common to have:  · Soreness and pain. This may last for a few days.  · Light-headedness.  · Mild bleeding from your vagina or dark-colored, grainy discharge from your vagina. This may last for a few days. You may need to wear a sanitary pad.  · Spotting for at least 48 hours after the procedure.    Follow these instructions at home:  · Take over-the-counter and prescription medicines only as told by your doctor. Ask your doctor what medicines you can start taking again. This is very important if you take blood-thinning medicine.  · Do not drive or use heavy machinery while taking prescription pain medicine.  · For 3 days, or as long as your doctor tells you, avoid:  ? Douching.  ? Using tampons.  ? Having sex.  · If you use birth control (contraception), keep using it.  · Limit activity for the first day after the procedure. Ask your doctor what activities are safe for you.  · It is up to you to get the results of your procedure. Ask your doctor when your results will be ready.  · Keep all follow-up visits as told by your doctor. This is important.  Contact a doctor if:  · You get a skin rash.  Get help right away if:  · You are bleeding a lot from your vagina. It is a lot of bleeding if you are using more than one pad an hour for 2 hours in a row.  · You have clumps of blood (blood clots) coming from your vagina.  · You have a fever.  · You have chills  · You have pain in your lower belly (pelvic area).  · You have signs of infection, such as vaginal  discharge that is:  ? Different than usual.  ? Yellow.  ? Bad-smelling.  · You have very pain or cramps in your lower belly that do not get better with medicine.  · You feel light-headed.  · You feel dizzy.  · You pass out (faint).  Summary  · If you did not have a tissue sample removed (did not have a biopsy), you may only have some spotting for a few days. You can go back to your normal activities.  · If you had a tissue sample removed, it is common to have mild pain and spotting for 48 hours.  · For 3 days, or as long as your doctor tells you, avoid douching, using tampons and having sex.  · Get help right away if you have bleeding, very bad pain, or signs of infection.  This information is not intended to replace advice given to you by your health care provider. Make sure you discuss any questions you have with your health care provider.  Document Released: 04/25/2008 Document Revised: 07/27/2016 Document Reviewed: 07/27/2016  Elsevier Interactive Patient Education © 2018 Elsevier Inc.

## 2018-03-15 ENCOUNTER — Telehealth: Payer: Self-pay

## 2018-03-15 NOTE — Telephone Encounter (Signed)
Please let patient know that colpo shows mild dysplasia, she will need a pap with cotesting and colpo repeated in 6 months.   Marny LowensteinWenzel, Julie N, PA-C   Called pt to inform her that her colpo shows mild dysplasia. Pt also informed that she will need a repeat colpo and a pap with cotesting in 6 months.

## 2018-03-15 NOTE — Telephone Encounter (Signed)
-----   Message from Marny LowensteinJulie N Wenzel, PA-C sent at 03/15/2018  2:49 PM EDT ----- Please let patient know that colpo shows mild dysplasia, she will need a pap with cotesting and colpo repeated in 6 months.   Marny LowensteinWenzel, Julie N, PA-C 03/15/2018 2:49 PM

## 2019-06-06 ENCOUNTER — Encounter: Payer: Self-pay | Admitting: Medical

## 2019-06-06 ENCOUNTER — Ambulatory Visit: Payer: Medicaid Other | Admitting: Medical

## 2019-08-06 ENCOUNTER — Telehealth: Payer: Self-pay | Admitting: Family Medicine

## 2019-08-06 NOTE — Telephone Encounter (Signed)
Attempted to call patient about her appointment. Was not able to get an answer, or leave a message.

## 2019-08-07 ENCOUNTER — Encounter: Payer: Self-pay | Admitting: Medical

## 2019-08-07 ENCOUNTER — Ambulatory Visit (INDEPENDENT_AMBULATORY_CARE_PROVIDER_SITE_OTHER): Payer: Medicaid Other | Admitting: Medical

## 2019-08-07 ENCOUNTER — Other Ambulatory Visit: Payer: Self-pay

## 2019-08-07 ENCOUNTER — Other Ambulatory Visit (HOSPITAL_COMMUNITY)
Admission: RE | Admit: 2019-08-07 | Discharge: 2019-08-07 | Disposition: A | Discharge status: Home / Self Care | Payer: Medicaid Other | Source: Ambulatory Visit | Attending: Medical | Admitting: Medical

## 2019-08-07 VITALS — BP 111/74 | HR 86 | Temp 98.5°F | Ht 64.0 in | Wt 107.2 lb

## 2019-08-07 DIAGNOSIS — R87611 Atypical squamous cells cannot exclude high grade squamous intraepithelial lesion on cytologic smear of cervix (ASC-H): Secondary | ICD-10-CM | POA: Diagnosis not present

## 2019-08-07 DIAGNOSIS — Z3046 Encounter for surveillance of implantable subdermal contraceptive: Secondary | ICD-10-CM | POA: Insufficient documentation

## 2019-08-07 LAB — POCT PREGNANCY, URINE: Preg Test, Ur: NEGATIVE

## 2019-08-07 NOTE — Patient Instructions (Signed)

## 2019-08-07 NOTE — Progress Notes (Signed)
GYNECOLOGY CLINIC PROCEDURE NOTE  Ms. Andrea Weiss is a 23 y.o. G1P1001 here for Nexplanon removal. No GYN concerns.  Last pap smear was on 01/2018 and was abnormal. 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 She needs a repeat pap smear today. No other gynecologic concerns.  Physical Exam  Nursing note and vitals reviewed. Constitutional: She is oriented to person, place, and time. She appears well-developed and well-nourished. No distress.  HENT:  Head: Normocephalic and atraumatic.  Cardiovascular: Normal rate.  Respiratory: Effort normal.  GI: Soft.  Genitourinary: Cervix exhibits no discharge and no friability.    No vaginal discharge or bleeding.  No bleeding in the vagina.  Neurological: She is alert and oriented to person, place, and time.  Skin: Skin is warm and dry. No erythema.  Psychiatric: She has a normal mood and affect.     Nexplanon Removal Patient was given informed consent for removal of her Nexplanon.  Appropriate time out taken. Nexplanon site identified.  Area prepped in usual sterile fashon. Three ml of 1% lidocaine was used to anesthetize the area at the distal end of the implant. A small stab incision was made right beside the implant on the distal portion.  The Nexplanon rod was grasped using hemostats and removed without difficulty.  There was minimal blood loss. There were no complications.  A small amount of antibiotic ointment and steri-strips were applied over the small incision.  A pressure bandage was applied to reduce any bruising.  The patient tolerated the procedure well and was given post procedure instructions.  Patient is planning to use condoms/abstinence for contraception.  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

## 2019-08-07 NOTE — Progress Notes (Signed)
Pt states at this point she does not want any other BC.t also states has been nauseated for the last 2 weeks.

## 2019-08-09 ENCOUNTER — Telehealth: Payer: Self-pay

## 2019-08-09 NOTE — Telephone Encounter (Signed)
Pt called the Nurse Line @! 9:44a, stating that she is still having bleeding after she had her Nexplanon removed 2 days ago,spoke with Provider Dr. Rip Harbour & He states she needs to go to an Urgent Care to be seen, advised pt & she verbalized understanding.

## 2019-08-13 LAB — CYTOLOGY - PAP
Diagnosis: UNDETERMINED — AB
HPV 16: NEGATIVE
HPV 18 / 45: NEGATIVE
High risk HPV: POSITIVE — AB
Molecular Disclaimer: 56
Molecular Disclaimer: DETECTED
Molecular Disclaimer: NEGATIVE
Molecular Disclaimer: NORMAL
Molecular Disclaimer: NORMAL

## 2019-09-09 ENCOUNTER — Telehealth: Payer: Self-pay | Admitting: Obstetrics & Gynecology

## 2019-09-09 NOTE — Telephone Encounter (Signed)
Called the patient to inform of the upcoming appointment. Received a message we're sorry, your call can not be completed at this time. Please hang up and try your call again later.

## 2019-09-10 ENCOUNTER — Encounter: Payer: Self-pay | Admitting: Family Medicine

## 2019-09-10 ENCOUNTER — Encounter: Payer: Medicaid Other | Admitting: Obstetrics and Gynecology

## 2020-03-26 ENCOUNTER — Ambulatory Visit (LOCAL_COMMUNITY_HEALTH_CENTER): Payer: Medicaid Other

## 2020-03-26 ENCOUNTER — Other Ambulatory Visit: Payer: Self-pay

## 2020-03-26 VITALS — BP 118/77 | Ht 64.0 in | Wt 108.5 lb

## 2020-03-26 DIAGNOSIS — Z3201 Encounter for pregnancy test, result positive: Secondary | ICD-10-CM

## 2020-03-26 LAB — PREGNANCY, URINE: Preg Test, Ur: POSITIVE — AB

## 2020-03-26 NOTE — Progress Notes (Signed)
As already taking PNV, no vitamins dispensed today. Client scheduled [facility] IP appt for 03/31/2020. Jossie Ng, RN

## 2020-03-28 ENCOUNTER — Emergency Department
Admission: EM | Admit: 2020-03-28 | Discharge: 2020-03-28 | Disposition: A | Discharge status: Home / Self Care | Payer: Medicaid Other | Attending: Emergency Medicine | Admitting: Emergency Medicine

## 2020-03-28 ENCOUNTER — Other Ambulatory Visit: Payer: Self-pay

## 2020-03-28 ENCOUNTER — Encounter: Payer: Self-pay | Admitting: Emergency Medicine

## 2020-03-28 ENCOUNTER — Emergency Department: Payer: Medicaid Other

## 2020-03-28 DIAGNOSIS — R102 Pelvic and perineal pain: Secondary | ICD-10-CM | POA: Insufficient documentation

## 2020-03-28 DIAGNOSIS — Z3A00 Weeks of gestation of pregnancy not specified: Secondary | ICD-10-CM | POA: Insufficient documentation

## 2020-03-28 DIAGNOSIS — Z87891 Personal history of nicotine dependence: Secondary | ICD-10-CM | POA: Insufficient documentation

## 2020-03-28 DIAGNOSIS — O3680X0 Pregnancy with inconclusive fetal viability, not applicable or unspecified: Secondary | ICD-10-CM | POA: Diagnosis not present

## 2020-03-28 DIAGNOSIS — O4690 Antepartum hemorrhage, unspecified, unspecified trimester: Secondary | ICD-10-CM | POA: Insufficient documentation

## 2020-03-28 DIAGNOSIS — Z79899 Other long term (current) drug therapy: Secondary | ICD-10-CM | POA: Diagnosis not present

## 2020-03-28 DIAGNOSIS — O209 Hemorrhage in early pregnancy, unspecified: Secondary | ICD-10-CM

## 2020-03-28 LAB — BASIC METABOLIC PANEL
Anion gap: 9 (ref 5–15)
BUN: 8 mg/dL (ref 6–20)
CO2: 24 mmol/L (ref 22–32)
Calcium: 9.4 mg/dL (ref 8.9–10.3)
Chloride: 104 mmol/L (ref 98–111)
Creatinine, Ser: 0.56 mg/dL (ref 0.44–1.00)
GFR calc Af Amer: >60 mL/min (ref >60)
GFR calc non Af Amer: >60 mL/min (ref >60)
Glucose, Bld: 101 mg/dL — ABNORMAL HIGH (ref 70–99)
Potassium: 4.1 mmol/L (ref 3.5–5.1)
Sodium: 137 mmol/L (ref 135–145)

## 2020-03-28 LAB — CBC WITH DIFFERENTIAL/PLATELET
Abs Immature Granulocytes: 0.01 10*3/uL (ref 0.00–0.07)
Basophils Absolute: 0 10*3/uL (ref 0.0–0.1)
Basophils Relative: 1 %
Eosinophils Absolute: 0.1 10*3/uL (ref 0.0–0.5)
Eosinophils Relative: 2 %
HCT: 41.7 % (ref 36.0–46.0)
Hemoglobin: 13.6 g/dL (ref 12.0–15.0)
Immature Granulocytes: 0 %
Lymphocytes Relative: 44 %
Lymphs Abs: 2.6 10*3/uL (ref 0.7–4.0)
MCH: 26.6 pg (ref 26.0–34.0)
MCHC: 32.6 g/dL (ref 30.0–36.0)
MCV: 81.4 fL (ref 80.0–100.0)
Monocytes Absolute: 0.4 10*3/uL (ref 0.1–1.0)
Monocytes Relative: 6 %
Neutro Abs: 2.7 10*3/uL (ref 1.7–7.7)
Neutrophils Relative %: 47 %
Platelets: 170 10*3/uL (ref 150–400)
RBC: 5.12 MIL/uL — ABNORMAL HIGH (ref 3.87–5.11)
RDW: 13.7 % (ref 11.5–15.5)
WBC: 5.8 10*3/uL (ref 4.0–10.5)
nRBC: 0 % (ref 0.0–0.2)

## 2020-03-28 LAB — HCG, QUANTITATIVE, PREGNANCY: hCG, Beta Chain, Quant, S: 2485 m[IU]/mL — ABNORMAL HIGH (ref <5)

## 2020-03-28 LAB — ABO/RH: ABO/RH(D): O POS

## 2020-03-28 LAB — POCT PREGNANCY, URINE: Preg Test, Ur: POSITIVE — AB

## 2020-03-28 MED ORDER — OXYCODONE-ACETAMINOPHEN 5-325 MG PO TABS
1.0000 | ORAL_TABLET | Freq: Once | ORAL | Status: AC
  Administered 2020-03-28: 15:00:00 1 via ORAL
  Filled 2020-03-28: qty 1

## 2020-03-28 MED ORDER — HYDROCODONE-ACETAMINOPHEN 5-325 MG PO TABS: 1.0000 | ORAL_TABLET | ORAL | 0 refills | Status: DC | PRN

## 2020-03-28 NOTE — ED Triage Notes (Signed)
Pt presents to ED via POV, states had doctors appt last week, according to mychart had doctors appt on 5/6 and had positive preg test at her doctors appt. Pt states had some bleeding at doctors appt and was told that it was implantation bleeding. Pt states this morning went to the bathroom and had worsening bleeding and shooting pain her L groin area. Pt states "I started pushing and I saw what looked like a baby come out". Pt states she "became too tired from pushing so I just fell asleep and when I woke up I was still having real bad pains so then I came here". VSS in triage, pt noted to be tearful during triage.

## 2020-03-28 NOTE — Progress Notes (Signed)
Consult History and Physical   SERVICE: [facility] from Dr Cherylann Banas   Patient Name: Andrea Weiss Patient MRN:   [id number]  CC: pt with 2 days bleeding and 1 day left lower pelvic pain .   HPI: Andrea Weiss is a 24 y.o. G2P1001 with  LMP early Feb 2021 . Pt with irregular cycles . States she  passed what appeared to be tissue yesterday  G3 P1011 Prior h/o Trichomonas 3 yrsa ago . No abdominal / pelvic surger U/s today fails to show IUP or adnexal mass . BT O+  Review of Systems: positives in bold GEN:   fevers, chills, weight changes, appetite changes, fatigue, night sweats HEENT:  HA, vision changes, hearing loss, congestion, rhinorrhea, sinus pressure, dysphagia CV:   CP, palpitations PULM:  SOB, cough GI:  abd pain, N/V/D/C GU:  dysuria, urgency, frequency MSK:  arthralgias, myalgias, back pain, swelling SKIN:  rashes, color changes, pallor NEURO:  numbness, weakness, tingling, seizures, dizziness, tremors PSYCH:  depression, anxiety, behavioral problems, confusion  HEME/LYMPH:  easy bruising or bleeding ENDO:  heat/cold intolerance  Past Obstetrical History: OB History    Gravida  2   Para  1   Term  1   Preterm  0   AB  0   Living  1     SAB  0   TAB  0   Ectopic  0   Multiple  0   Live Births  1           Past Gynecologic History: Patient's last menstrual period was 12/28/2019 (within weeks).  Past Medical History: Past Medical History:  Diagnosis Date  . Abdominal hernia   . History of anemia   . History of pica   . History of urinary tract infection   . Medical history non-contributory     Past Surgical History:   Past Surgical History:  Procedure Laterality Date  . Nexplanon insertion    . WISDOM TOOTH EXTRACTION Bilateral     Family History:  family history is not on file.  Social History:  Social History   Socioeconomic History  . Marital status: Single    Spouse name: Not on file  . Number of  children: Not on file  . Years of education: Not on file  . Highest education level: Not on file  Occupational History  . Not on file  Tobacco Use  . Smoking status: Former Smoker    Packs/day: 0.25    Types: Cigarettes  . Smokeless tobacco: Never Used  Substance and Sexual Activity  . Alcohol use: Yes    Comment: Last ETOH use 4 weeks ago  . Drug use: Yes    Types: Marijuana    Comment: Last marijuana use 03/25/2020  . Sexual activity: Yes    Birth control/protection: Condom  Other Topics Concern  . Not on file  Social History Narrative  . Not on file   Social Determinants of Health   Financial Resource Strain:   . Difficulty of Paying Living Expenses:   Food Insecurity:   . Worried About Charity fundraiser in the Last Year:   . Arboriculturist in the Last Year:   Transportation Needs:   . Film/video editor (Medical):   Marland Kitchen Lack of Transportation (Non-Medical):   Physical Activity:   . Days of Exercise per Week:   . Minutes of Exercise per Session:   Stress:   . Feeling of Stress :   Social  Connections:   . Frequency of Communication with Friends and Family:   . Frequency of Social Gatherings with Friends and Family:   . Attends Religious Services:   . Active Member of Clubs or Organizations:   . Attends Banker Meetings:   Marland Kitchen Marital Status:   Intimate Partner Violence: Not At Risk  . Fear of Current or Ex-Partner: No  . Emotionally Abused: No  . Physically Abused: No  . Sexually Abused: No    Home Medications:  Medications reconciled in EPIC  No current facility-administered medications on file prior to encounter.   Current Outpatient Medications on File Prior to Encounter  Medication Sig Dispense Refill  . acetaminophen (TYLENOL) 500 MG tablet Take 1,000 mg by mouth every 6 (six) hours as needed for mild pain or headache.    . cetirizine (ZYRTEC) 10 MG tablet Take 1 tablet (10 mg total) by mouth daily. (Patient not taking: Reported on  08/07/2019) 14 tablet 2  . hydrocortisone cream 0.5 % Apply 1 application topically 2 (two) times daily. (Patient not taking: Reported on 03/14/2018) 30 g 0  . ibuprofen (ADVIL,MOTRIN) 600 MG tablet Take 1 tablet (600 mg total) by mouth every 6 (six) hours. (Patient not taking: Reported on 12/27/2017) 30 tablet 0  . Prenatal Vit-Fe Fumarate-FA (MULTIVITAMIN-PRENATAL) 27-0.8 MG TABS tablet Take 1 tablet by mouth daily at 12 noon.      Allergies:  Allergies  Allergen Reactions  . Mango Flavor Hives and Swelling    Allergy to mangos    Physical Exam:  Temp:  [98.7 F (37.1 C)] 98.7 F (37.1 C) (05/08 1025) Pulse Rate:  [60-91] 61 (05/08 1430) Resp:  [18] 18 (05/08 1025) BP: (104-115)/(67-74) 115/70 (05/08 1430) SpO2:  [98 %-100 %] 100 % (05/08 1430) Weight:  [49 kg] 49 kg (05/08 1024)   General Appearance:  Well developed, well nourished, no acute distress, alert and oriented x3 HEENT:  Normocephalic atraumatic, extraocular movements intact, moist mucous membranes Cardiovascular:  Normal S1/S2, regular rate and rhythm, no murmurs Pulmonary:  clear to auscultation, no wheezes, rales or rhonchi, symmetric air entry, good air exchange Abdomen:  Bowel sounds present, soft, nontender, nondistended, no abnormal masses, no epigastric pain Extremities:  Full range of motion, no pedal edema, 2+ distal pulses, no tenderness Skin:  normal coloration and turgor, no rashes, no suspicious skin lesions noted  Neurologic:  Cranial nerves 2-12 grossly intact, normal muscle tone, strength 5/5 all four extremities Psychiatric:  Normal mood and affect, appropriate, no AH/VH Pelvic:  NEFG, no vulvar masses or lesions, normal vaginal mucosa,  2 cc dark blood  cx os closed  utx 6 weeks  Mild bilateral adnexal TTP without mass   Labs/Studies:   CBC and Coags:  Lab Results  Component Value Date   WBC 5.8 03/28/2020   NEUTOPHILPCT 47 03/28/2020   EOSPCT 2 03/28/2020   BASOPCT 1 03/28/2020   LYMPHOPCT  44 03/28/2020   HGB 13.6 03/28/2020   HCT 41.7 03/28/2020   MCV 81.4 03/28/2020   PLT 170 03/28/2020   CMP:  Lab Results  Component Value Date   NA 137 03/28/2020   K 4.1 03/28/2020   CL 104 03/28/2020   CO2 24 03/28/2020   BUN 8 03/28/2020   CREATININE 0.56 03/28/2020   CREATININE 0.60 06/04/2015    Other Imaging: US OB LESS THAN 14 WEEKS WITH OB TRANSVAGINAL  Result Date: 03/28/2020 CLINICAL DATA:  Vaginal bleeding EXAM: OBSTETRIC <14 WK [place] AND TRANSVAGINAL OB  US TECHNIQUE: Both transabdominal and transvaginal ultrasound examinations were performed for complete evaluation of the gestation as well as the maternal uterus, adnexal regions, and pelvic cul-de-sac. Transvaginal technique was performed to assess early pregnancy. COMPARISON:  None. FINDINGS: Intrauterine gestational sac: Not visualized Yolk sac:  Not visualized Embryo:  Not visualized Cardiac Activity: Not visualized Subchorionic hemorrhage:  None visualized. Maternal uterus/adnexae: Cervical os closed. Right ovary measures 3.1 x 2.2 x 2.5 cm. Left ovary measures 3.6 x 2.8 x 2.5 cm. No extrauterine pelvic or adnexal mass. No free pelvic fluid. IMPRESSION: No intrauterine gestation evident. Assuming positive beta HCG value, differential considerations must include intrauterine gestation too early to be seen by either transabdominal transvaginal technique; recent spontaneous abortion; ectopic gestation. This circumstance warrants close clinical and imaging surveillance. Timing of repeat ultrasound in large part will depend on beta HCG values going forward. No intrauterine or extrauterine pelvic mass or inflammatory focus appreciable on this study. No free pelvic fluid. Electronically Signed   By: Bretta Bang III M.D.   On: 03/28/2020 11:54     Assessment / Plan:   Probable complete AB . Marland Kitchen Remote possibility of ectopic pregnancy  Pt will follow up with me on Monday for repeat  Bhcg  And exam  Precautions given if pain  worsens or heavy bleeding to call answering service or return to ED    Thank you for the opportunity to be involved with this pt's care.

## 2020-03-28 NOTE — ED Notes (Signed)
First Nurse Note: pt to ED via POV c/o vaginal bleeding, shortness of breath, and dizziness. Pt spouse also states that pt has urinated on herself x 1. Pt is in NAD

## 2020-03-28 NOTE — ED Notes (Signed)
This RN spoke with Dr. Marisa Severin confirming previous blood work placed, received further orders for ABO/Rh and US OB. Verbal orders placed by this RN.

## 2020-03-28 NOTE — ED Notes (Signed)
OB at bedside

## 2020-03-28 NOTE — ED Provider Notes (Signed)
Patient has been evaluated at bedside by Dr. Feliberto Gottron.  Felt to be more consistent with a complete miscarriage.  Patient be seen in close outpatient follow-up on Monday.     Willy Eddy, MD 03/28/20 [phone]

## 2020-03-28 NOTE — Discharge Instructions (Addendum)
Please follow up with Dr. Feliberto Gottron in clinic the beginning of this week.  Call office Monday morning for appointment time.  Return if you develop fever, worsening pain or have worsening bleeding.

## 2020-03-28 NOTE — ED Provider Notes (Signed)
[facility] Emergency Department Provider Note ____________________________________________   First MD Initiated Contact with Patient 03/28/20 1357     (approximate)  I have reviewed the triage vital signs and the nursing notes.   HISTORY  Chief Complaint Vaginal Bleeding    HPI Andrea Weiss is a 24 y.o. female G3P1 at unknown dates (LMP sometime in early February so possibly around 10-12 weeks) who presents with vaginal bleeding for the last several days, worsened today, and associated with pain in the left pelvis.  She passed clots with possible fetal material and the bleeding has since subsided but the pain remains.  She denies vomiting, diarrhea, fever or urinary symptoms.   Past Medical History:  Diagnosis Date  . Abdominal hernia   . History of anemia   . History of pica   . History of urinary tract infection   . Medical history non-contributory     There are no problems to display for this patient.   Past Surgical History:  Procedure Laterality Date  . Nexplanon insertion    . WISDOM TOOTH EXTRACTION Bilateral     Prior to Admission medications   Medication Sig Start Date End Date Taking? Authorizing Provider  acetaminophen (TYLENOL) 500 MG tablet Take 1,000 mg by mouth every 6 (six) hours as needed for mild pain or headache.    [provider]  cetirizine (ZYRTEC) 10 MG tablet Take 1 tablet (10 mg total) by mouth daily. Patient not taking: Reported on 08/07/2019 12/27/17   Marylene Land, CNM  hydrocortisone cream 0.5 % Apply 1 application topically 2 (two) times daily. Patient not taking: Reported on 03/14/2018 12/27/17   Marylene Land, CNM  ibuprofen (ADVIL,MOTRIN) 600 MG tablet Take 1 tablet (600 mg total) by mouth every 6 (six) hours. Patient not taking: Reported on 12/27/2017 11/19/17   Montez Morita, CNM  Prenatal Vit-Fe Fumarate-FA (MULTIVITAMIN-PRENATAL) 27-0.8 MG TABS tablet Take 1 tablet by  mouth daily at 12 noon.    [provider]    Allergies Mango flavor  History reviewed. No pertinent family history.  Social History Social History   Tobacco Use  . Smoking status: Former Smoker    Packs/day: 0.25    Types: Cigarettes  . Smokeless tobacco: Never Used  Substance Use Topics  . Alcohol use: Yes    Comment: Last ETOH use 4 weeks ago  . Drug use: Yes    Types: Marijuana    Comment: Last marijuana use 03/25/2020    Review of Systems  Constitutional: No fever/chills. Eyes: No visual changes. ENT: No sore throat. Cardiovascular: Denies chest pain. Respiratory: Denies shortness of breath. Gastrointestinal: No vomiting or diarrhea.  Genitourinary: Negative for dysuria. Positive for vaginal bleeding.  Musculoskeletal: Positive for back pain. Skin: Negative for rash. Neurological: Negative for headaches, focal weakness or numbness.   ____________________________________________   PHYSICAL EXAM:  VITAL SIGNS: ED Triage Vitals  Enc Vitals Group     BP 03/28/20 1025 114/74     Pulse Rate 03/28/20 1025 91     Resp 03/28/20 1025 18     Temp 03/28/20 1025 98.7 F (37.1 C)     Temp Source 03/28/20 1025 Oral     SpO2 03/28/20 1025 98 %     Weight 03/28/20 1024 108 lb (49 kg)     Height 03/28/20 1024 5\' 4"  (1.626 m)     Head Circumference --      Peak Flow --      Pain  Score 03/28/20 1042 7     Pain Loc --      Pain Edu? --      Excl. in [place]? --     Constitutional: Alert and oriented. Uncomfortable appearing but in no acute distress. Eyes: Conjunctivae are normal.  Head: Atraumatic. Nose: No congestion/rhinnorhea. Mouth/Throat: Mucous membranes are moist.   Neck: Normal range of motion.  Cardiovascular: Normal rate, regular rhythm.  Good peripheral circulation. Respiratory: Normal respiratory effort.  No retractions.  Gastrointestinal: Soft with moderate left suprapubic. No distention.  Genitourinary: Left adnexal tenderness.  Small amount of  blood pooling in vault.  Cervical os closed.  Musculoskeletal: Extremities warm and well perfused.  Neurologic:  Normal speech and language. No gross focal neurologic deficits are appreciated.  Skin:  Skin is warm and dry. No rash noted. Psychiatric: Mood and affect are normal. Speech and behavior are normal.  ____________________________________________   LABS (all labs ordered are listed, but only abnormal results are displayed)  Labs Reviewed  CBC WITH DIFFERENTIAL/PLATELET - Abnormal; Notable for the following components:      Result Value   RBC 5.12 (*)    All other components within normal limits  BASIC METABOLIC PANEL - Abnormal; Notable for the following components:   Glucose, Bld 101 (*)    All other components within normal limits  HCG, QUANTITATIVE, PREGNANCY - Abnormal; Notable for the following components:   hCG, Beta Chain, Quant, S 2,485 (*)    All other components within normal limits  POCT PREGNANCY, URINE - Abnormal; Notable for the following components:   Preg Test, Ur POSITIVE (*)    All other components within normal limits  POC URINE PREG, ED  ABO/RH   ____________________________________________  EKG   ____________________________________________  RADIOLOGY  US pelvis: No visible IUP.  Unremarkable adnexae.   ____________________________________________   PROCEDURES  Procedure(s) performed: No  Procedures  Critical Care performed: No ____________________________________________   INITIAL IMPRESSION / ASSESSMENT AND PLAN / ED COURSE  Pertinent labs & imaging results that were available during my care of the patient were reviewed by me and considered in my medical decision making (see chart for details).  24 year old G3P1 at possibly 10-12 weeks by dates presents with vaginal bleeding and suprapubic pain, along with having passed clots.  On exam she is uncomfortable but not ill appearing.  She has left suprapubic tenderness and similar  adnexal tenderness on pelvic exam with a small amount of bleeding and a closed os.  The lab workup is reassuring and the patient is Rh positive.  US obtained from triage shows no IUP.  Hcg is in the 2000s.  Differential includes spontaneous abortion vs less likely possible ectopic.  The patient is clinically stable.  I will consult ob/gyn.    ----------------------------------------- 3:35 PM on 03/28/2020 -----------------------------------------  I consulted Dr. Ouida Sills from ob/gyn who will evaluate the patient.  I signed her out to the oncoming physician Dr. Quentin Cornwall.      ____________________________________________   FINAL CLINICAL IMPRESSION(S) / ED DIAGNOSES  Final diagnoses:  Vaginal bleeding affecting early pregnancy      NEW MEDICATIONS STARTED DURING THIS VISIT:  New Prescriptions   No medications on file     Note:  This document was prepared using Dragon voice recognition software and may include unintentional dictation errors.   Arta Silence, MD 03/28/20 1950

## 2020-03-30 ENCOUNTER — Telehealth: Payer: Self-pay

## 2020-03-30 NOTE — Telephone Encounter (Signed)
Per Epic-seen @ ED with bleeding; call to client-reports was at [facility] today and has f/u appt 03/31/20.  Informed will cancel 03/31/20 new ob @ [facility] and if released by Gavin Potters and if pregnancy progressing normally-to call back if desires appt rescheduled.  Agreed to plan Sharlette Dense, RN

## 2020-05-07 ENCOUNTER — Telehealth: Payer: Self-pay | Admitting: Family Medicine

## 2020-05-07 NOTE — Telephone Encounter (Signed)
LVM with phone number for patient to call and reschedule her missed appointment on 03/31/2020.

## 2020-06-11 ENCOUNTER — Ambulatory Visit (LOCAL_COMMUNITY_HEALTH_CENTER): Payer: Medicaid Other

## 2020-06-11 ENCOUNTER — Other Ambulatory Visit: Payer: Self-pay

## 2020-06-11 VITALS — BP 120/76 | Ht 64.0 in | Wt 108.5 lb

## 2020-06-11 DIAGNOSIS — Z3201 Encounter for pregnancy test, result positive: Secondary | ICD-10-CM

## 2020-06-11 NOTE — Progress Notes (Signed)
Pt plans prenatal care at [facility]; sent to preadmit. Per pt, pt is excited and happy about pregnancy, is a little nervous because she recently had miscarriage. Epic stopped responding and unable to chart visit with pt present; documented after pt left and Epic started working again.

## 2020-06-12 LAB — PREGNANCY, URINE: Preg Test, Ur: POSITIVE — AB

## 2020-07-21 DIAGNOSIS — Z3490 Encounter for supervision of normal pregnancy, unspecified, unspecified trimester: Secondary | ICD-10-CM | POA: Insufficient documentation

## 2020-07-21 DIAGNOSIS — Z3483 Encounter for supervision of other normal pregnancy, third trimester: Secondary | ICD-10-CM

## 2020-07-21 HISTORY — DX: Encounter for supervision of other normal pregnancy, third trimester: Z34.83

## 2020-07-29 LAB — OB RESULTS CONSOLE HEPATITIS B SURFACE ANTIGEN: Hepatitis B Surface Ag: NEGATIVE

## 2020-07-29 LAB — OB RESULTS CONSOLE RPR: RPR: NONREACTIVE

## 2020-07-29 LAB — OB RESULTS CONSOLE GC/CHLAMYDIA
Chlamydia: NEGATIVE
Gonorrhea: NEGATIVE

## 2020-07-29 LAB — OB RESULTS CONSOLE RUBELLA ANTIBODY, IGM: Rubella: IMMUNE

## 2020-07-29 LAB — OB RESULTS CONSOLE HIV ANTIBODY (ROUTINE TESTING): HIV: NONREACTIVE

## 2020-07-29 LAB — OB RESULTS CONSOLE VARICELLA ZOSTER ANTIBODY, IGG: Varicella: IMMUNE

## 2020-09-16 ENCOUNTER — Emergency Department
Admission: EM | Admit: 2020-09-16 | Discharge: 2020-09-16 | Disposition: A | Discharge status: Home / Self Care | Payer: Medicaid Other | Attending: Emergency Medicine | Admitting: Emergency Medicine

## 2020-09-16 ENCOUNTER — Other Ambulatory Visit: Payer: Self-pay

## 2020-09-16 ENCOUNTER — Emergency Department: Payer: Medicaid Other

## 2020-09-16 DIAGNOSIS — Z3A19 19 weeks gestation of pregnancy: Secondary | ICD-10-CM | POA: Diagnosis not present

## 2020-09-16 DIAGNOSIS — Z87891 Personal history of nicotine dependence: Secondary | ICD-10-CM | POA: Insufficient documentation

## 2020-09-16 DIAGNOSIS — O4702 False labor before 37 completed weeks of gestation, second trimester: Secondary | ICD-10-CM

## 2020-09-16 DIAGNOSIS — M545 Low back pain, unspecified: Secondary | ICD-10-CM | POA: Insufficient documentation

## 2020-09-16 DIAGNOSIS — R1030 Lower abdominal pain, unspecified: Secondary | ICD-10-CM | POA: Diagnosis not present

## 2020-09-16 DIAGNOSIS — O26892 Other specified pregnancy related conditions, second trimester: Secondary | ICD-10-CM | POA: Insufficient documentation

## 2020-09-16 DIAGNOSIS — R1084 Generalized abdominal pain: Secondary | ICD-10-CM

## 2020-09-16 LAB — COMPREHENSIVE METABOLIC PANEL
ALT: 12 U/L (ref 0–44)
AST: 21 U/L (ref 15–41)
Albumin: 3.7 g/dL (ref 3.5–5.0)
Alkaline Phosphatase: 47 U/L (ref 38–126)
Anion gap: 9 (ref 5–15)
BUN: 8 mg/dL (ref 6–20)
CO2: 23 mmol/L (ref 22–32)
Calcium: 8.8 mg/dL — ABNORMAL LOW (ref 8.9–10.3)
Chloride: 103 mmol/L (ref 98–111)
Creatinine, Ser: 0.35 mg/dL — ABNORMAL LOW (ref 0.44–1.00)
GFR, Estimated: >60 mL/min (ref >60)
Glucose, Bld: 100 mg/dL — ABNORMAL HIGH (ref 70–99)
Potassium: 3.6 mmol/L (ref 3.5–5.1)
Sodium: 135 mmol/L (ref 135–145)
Total Bilirubin: 0.8 mg/dL (ref 0.3–1.2)
Total Protein: 6.9 g/dL (ref 6.5–8.1)

## 2020-09-16 LAB — CBC WITH DIFFERENTIAL/PLATELET
Abs Immature Granulocytes: 0.06 10*3/uL (ref 0.00–0.07)
Basophils Absolute: 0 10*3/uL (ref 0.0–0.1)
Basophils Relative: 0 %
Eosinophils Absolute: 0.2 10*3/uL (ref 0.0–0.5)
Eosinophils Relative: 2 %
HCT: 34.5 % — ABNORMAL LOW (ref 36.0–46.0)
Hemoglobin: 11.3 g/dL — ABNORMAL LOW (ref 12.0–15.0)
Immature Granulocytes: 1 %
Lymphocytes Relative: 29 %
Lymphs Abs: 2.2 10*3/uL (ref 0.7–4.0)
MCH: 27.4 pg (ref 26.0–34.0)
MCHC: 32.8 g/dL (ref 30.0–36.0)
MCV: 83.7 fL (ref 80.0–100.0)
Monocytes Absolute: 0.4 10*3/uL (ref 0.1–1.0)
Monocytes Relative: 6 %
Neutro Abs: 4.7 10*3/uL (ref 1.7–7.7)
Neutrophils Relative %: 62 %
Platelets: 147 10*3/uL — ABNORMAL LOW (ref 150–400)
RBC: 4.12 MIL/uL (ref 3.87–5.11)
RDW: 14.3 % (ref 11.5–15.5)
WBC: 7.6 10*3/uL (ref 4.0–10.5)
nRBC: 0 % (ref 0.0–0.2)

## 2020-09-16 LAB — CHLAMYDIA/NGC RT PCR (ARMC ONLY)
Chlamydia Tr: NOT DETECTED
N gonorrhoeae: NOT DETECTED

## 2020-09-16 LAB — URINALYSIS, COMPLETE (UACMP) WITH MICROSCOPIC
Bacteria, UA: NONE SEEN
Bilirubin Urine: NEGATIVE
Glucose, UA: NEGATIVE mg/dL
Hgb urine dipstick: NEGATIVE
Ketones, ur: NEGATIVE mg/dL
Leukocytes,Ua: NEGATIVE
Nitrite: NEGATIVE
Protein, ur: NEGATIVE mg/dL
Specific Gravity, Urine: 1.014 (ref 1.005–1.030)
pH: 8 (ref 5.0–8.0)

## 2020-09-16 LAB — APTT: aPTT: 30 seconds (ref 24–36)

## 2020-09-16 LAB — FIBRINOGEN: Fibrinogen: 330 mg/dL (ref 210–475)

## 2020-09-16 LAB — WET PREP, GENITAL
Clue Cells Wet Prep HPF POC: NONE SEEN
Sperm: NONE SEEN
Trich, Wet Prep: NONE SEEN
Yeast Wet Prep HPF POC: NONE SEEN

## 2020-09-16 LAB — PROTIME-INR
INR: 0.9 (ref 0.8–1.2)
Prothrombin Time: 12 seconds (ref 11.4–15.2)

## 2020-09-16 NOTE — ED Notes (Signed)
OB midwife saw patient, patient has god follow up on Nov 3, OK to discharge home, Dr. Katrinka Blazing aware.

## 2020-09-16 NOTE — ED Provider Notes (Signed)
[facility] Emergency Department Provider Note ____________________________________________   First MD Initiated Contact with Patient 09/16/20 1259     (approximate)  I have reviewed the triage vital signs and the nursing notes.  HISTORY  Chief Complaint Contractions   HPI Andrea Weiss is a 24 y.o. femalewho presents to the ED for evaluation of contractions.   Chart review indicates  G3 P1-0-1-1 at about [redacted] weeks gestation. [facility] clinic OB/GYN. Dr. Feliberto Gottron on 9/8.     Patient presents to the ED about 45 minutes after onset of lower abdominal cramping sensation that she is concerned represents contractions and preterm labor.  She reports previously going into preterm labor prior to her 1 miscarriage, and this feels similar.  She reports lower abdominal cramping that lasts for a matter of minutes, with about 1 minute in between, before recurring.  She reports associated lower back pain that reminds her of being in labor.  She denies vaginal bleeding, loss of fluids, syncope, dysuria, other vaginal discharge, diarrhea.  Past Medical History:  Diagnosis Date  . Abdominal hernia   . History of anemia   . History of pica   . History of urinary tract infection   . Medical history non-contributory     There are no problems to display for this patient.   Past Surgical History:  Procedure Laterality Date  . Nexplanon insertion    . WISDOM TOOTH EXTRACTION Bilateral     Prior to Admission medications   Medication Sig Start Date End Date Taking? Authorizing Provider  acetaminophen (TYLENOL) 500 MG tablet Take 1,000 mg by mouth every 6 (six) hours as needed for mild pain or headache.    [provider]  cetirizine (ZYRTEC) 10 MG tablet Take 1 tablet (10 mg total) by mouth daily. Patient not taking: Reported on 08/07/2019 12/27/17   Marylene Land, CNM  HYDROcodone-acetaminophen (NORCO) 5-325 MG tablet Take 1 tablet by mouth  every 4 (four) hours as needed for moderate pain. Patient not taking: Reported on 06/11/2020 03/28/20   Willy Eddy, MD  hydrocortisone cream 0.5 % Apply 1 application topically 2 (two) times daily. Patient not taking: Reported on 03/14/2018 12/27/17   Marylene Land, CNM  ibuprofen (ADVIL,MOTRIN) 600 MG tablet Take 1 tablet (600 mg total) by mouth every 6 (six) hours. Patient not taking: Reported on 12/27/2017 11/19/17   Montez Morita, CNM  Prenatal Vit-Fe Fumarate-FA (MULTIVITAMIN-PRENATAL) 27-0.8 MG TABS tablet Take 2 tablets by mouth daily at 12 noon. 2 gummies per day    [provider]    Allergies Mango flavor  No family history on file.  Social History Social History   Tobacco Use  . Smoking status: Former Smoker    Packs/day: 0.25    Types: Cigarettes  . Smokeless tobacco: Never Used  Vaping Use  . Vaping Use: Former  . Quit date: 12/28/2019  Substance Use Topics  . Alcohol use: Not Currently    Comment: quit when she found out she was pregnant, was occassioanl  . Drug use: Not Currently    Types: Marijuana    Comment: quit when she found out she was pregnant    Review of Systems  Constitutional: No fever/chills Eyes: No visual changes. ENT: No sore throat. Cardiovascular: Denies chest pain. Respiratory: Denies shortness of breath. Gastrointestinal: Positive for cramping abdominal pain.  No nausea, no vomiting.  No diarrhea.  No constipation. Genitourinary: Negative for dysuria. Musculoskeletal: Negative for back pain. Skin: Negative for rash. Neurological:  Negative for headaches, focal weakness or numbness.  ____________________________________________   PHYSICAL EXAM:  VITAL SIGNS: Vitals:   09/16/20 1248 09/16/20 1400  BP:  126/69  Pulse:  83  Resp: (!) 26 (!) 22  Temp:    SpO2:  100%      Constitutional: Alert and oriented. Well appearing and in no acute distress. Eyes: Conjunctivae are normal. PERRL. EOMI. Head:  Atraumatic. Nose: No congestion/rhinnorhea. Mouth/Throat: Mucous membranes are moist.  Oropharynx non-erythematous. Neck: No stridor. No cervical spine tenderness to palpation. Cardiovascular: Normal rate, regular rhythm. Grossly normal heart sounds.  Good peripheral circulation. Respiratory: Normal respiratory effort.  No retractions. Lungs CTAB. Gastrointestinal: Soft , nondistended. No abdominal bruits. No CVA tenderness. Gravid abdomen.  Mild diffuse lower quadrant tenderness without peritoneal features. GU: No signs of external pathology or bleeding.  Speculum exam demonstrates small volume thin white discharge in the vaginal vault and a closed cervical os.  No blood. Musculoskeletal: No lower extremity tenderness nor edema.  No joint effusions. No signs of acute trauma. Neurologic:  Normal speech and language. No gross focal neurologic deficits are appreciated. No gait instability noted. Skin:  Skin is warm, dry and intact. No rash noted. Psychiatric: Mood and affect are normal. Speech and behavior are normal.  ____________________________________________   LABS (all labs ordered are listed, but only abnormal results are displayed)  Labs Reviewed  WET PREP, GENITAL - Abnormal; Notable for the following components:      Result Value   WBC, Wet Prep HPF POC FEW (*)    All other components within normal limits  CBC WITH DIFFERENTIAL/PLATELET - Abnormal; Notable for the following components:   Hemoglobin 11.3 (*)    HCT 34.5 (*)    Platelets 147 (*)    All other components within normal limits  COMPREHENSIVE METABOLIC PANEL - Abnormal; Notable for the following components:   Glucose, Bld 100 (*)    Creatinine, Ser 0.35 (*)    Calcium 8.8 (*)    All other components within normal limits  URINALYSIS, COMPLETE (UACMP) WITH MICROSCOPIC - Abnormal; Notable for the following components:   Color, Urine YELLOW (*)    APPearance CLEAR (*)    All other components within normal limits    CHLAMYDIA/NGC RT PCR ([facility] ONLY)  PROTIME-INR  APTT  FIBRINOGEN  TYPE AND SCREEN   ____________________________________________  12 Lead EKG   ____________________________________________  RADIOLOGY  ED MD interpretation:    Bedside transabdominal pelvic ultrasound performed by me demonstrates IUP with heart rate of 180 bpm and no clear pathology.  Official radiology report(s): US OB Limited  Result Date: 09/16/2020 CLINICAL DATA:  Lower abdominal pain for 2 days EXAM: LIMITED OBSTETRIC ULTRASOUND FINDINGS: Number of Fetuses: 1 Heart Rate:  145 bpm Movement: Yes Presentation: Cephalic Placental Location: Anterior Previa: No Amniotic Fluid (Subjective):  Within normal limits. BPD: 4.3 cm 19 w  1 d MATERNAL FINDINGS: Cervix:  3.1 cm length.  Appears closed. Uterus/Adnexae: No abnormality visualized. IMPRESSION: Single live intrauterine gestation in cephalic presentation measuring 19 weeks 1 day by BPD. This exam is performed on an emergent basis and does not comprehensively evaluate fetal size, dating, or anatomy; follow-up complete OB US should be considered if further fetal assessment is warranted. Electronically Signed   By: Duanne Guess D.O.   On: 09/16/2020 14:00    ____________________________________________   PROCEDURES and INTERVENTIONS  Procedure(s) performed (including Critical Care):  Procedures  Medications - No data to display  ____________________________________________   MDM /  ED COURSE  24 year old female with history of preterm labor presents to the ED with concerns for contractions or preterm labor, likely due to [facility] contractions and amenable to outpatient management with OB/GYN follow-up.  Normal vital signs room air.  Exam with mild and vague lower quadrant tenderness without peritoneal features, she otherwise looks well and is benign.  Pelvic exam demonstrates a closed cervical os and only a small amounts of likely physiologic  fluid.  Swabs without evidence of BV, trichomonas or other infectious etiology.  Ultrasound without evidence of acute pathology.  Due to high concern for possible preterm labor with her history of such, spoke with OB/GYN who is midwife sees the patient and indicates patient is suitable for outpatient management with close clinic follow-up.  I believe this is reasonable as patient is now asymptomatic for greater than 2 hours while sitting in the ED.  We discussed return precautions for the ED.  Patient medically stable for discharge home.  Clinical Course as of Sep 16 1546  Wed Sep 16, 2020  1433 Paged Dr. Feliberto Gottron.    [DS]  [phone] Spoke with Dr. Feliberto Gottron, who will have his nurse midwife come evaluate the patient   [DS]  1455 Midwife seeing pt now   [DS]  1520 OB midwife indicates that she is comfortable with the patient being discharged and following up in the clinic next week.  She indicates that she provided patient with copious education   [DS]  1520 Reassessed.  Patient reports that she feels well and has no complaints or questions.  We talked about return precautions for the ED.   [DS]    Clinical Course User Index [DS] Delton Prairie, MD     ____________________________________________   FINAL CLINICAL IMPRESSION(S) / ED DIAGNOSES  Final diagnoses:  Threatened premature labor in second trimester     ED Discharge Orders    None       Willia Genrich Katrinka Blazing   Note:  This document was prepared using Dragon voice recognition software and may include unintentional dictation errors.   Delton Prairie, MD 09/16/20 [phone]

## 2020-09-16 NOTE — ED Notes (Signed)
Fetal heart rate 182 bpm

## 2020-09-16 NOTE — ED Notes (Signed)
ED Provider at bedside for bedside US 

## 2020-09-16 NOTE — Discharge Instructions (Signed)
As we discussed, please follow-up with Dr. Feliberto Gottron in his clinic.  If you develop any significantly worsening symptoms, especially commendation of vaginal bleeding or loss of fluids, please return to the ED.

## 2020-09-16 NOTE — ED Notes (Signed)
US at bedside

## 2020-09-16 NOTE — ED Triage Notes (Signed)
Pt to ED c/o contractions/abdominal pain/back pain that started at 12.  Pt 19 weeks and 2 days.  States started having contractions around this time with 1st child as well   Pt appears uncomfortable in triage

## 2020-09-16 NOTE — ED Notes (Signed)
Contractions 26 seconds apart, taken to rm 5. DR Adaline Sill aware to bedside

## 2020-09-18 LAB — TYPE AND SCREEN
ABO/RH(D): O POS
Antibody Screen: NEGATIVE

## 2020-11-21 NOTE — L&D Delivery Note (Signed)
Delivery Note  Andrea Weiss is a G3P1011 at [redacted]w[redacted]d with an LMP of 04/27/2020, consistent with [place] at [redacted]w[redacted]d.   First Stage: Labor onset: 1600 Induction: Oxytocin and AROM Analgesia /Anesthesia intrapartum: Epidural  AROM at 1747  Second Stage: Complete dilation at 1822 Onset of pushing at 1825 FHR second stage 125bpm with moderate variability, intermittent variables with pushing.   Andrea Weiss presented to L&D for scheduled elective IOL at term.  She progressed well with oxytocin and had rapid cervical change after AROM.  She had a strong urge to push at C/C/+2.  Andrea Weiss pushed quickly over approximately 10 minutes.  Delivery of a viable baby girl on 02/06/2021 at 1832 by CNM Delivery of fetal head in OA position with restitution to ROT. No nuchal cord;    Attempted delivery of anterior shoulder.  Maternal head lowered to provide more pelvic space and again attempted delivery of anterior should with gentle downward traction. Shoulder dystocia identified. Nursing resources called to room, nursery present at delivery.  Dystocia resolved with McRoberts maneuver and suprapubic pressure after 42 seconds. Anterior then posterior shoulders delivered with gentle downward traction. Vigorous baby placed on mom's chest, and attended to by baby RN. Cord double clamped after cessation of pulsation, cut by father of baby.   Cord blood sample collected: N/A - APGARs 8/9  Third Stage: Oxytocin bolus started after delivery of infant for hemorrhage prophylaxis  Placenta delivered intact with 3 VC @ 1837 Placenta disposition: discarded  Uterine tone firm / bleeding moderate   No laceration identified  Anesthesia for repair: N/A Repair: N/A Est. Blood Loss (mL): 400 ml  Complications: Shoulder dystocia x 42 seconds   Mom to postpartum.  Baby to Couplet care / Skin to Skin.  Newborn: Information for the patient's newborn:  Kendrea, [place] [[id number]]  Live born female "Delorise Shiner" Birth Weight:   Pending  APGAR: 8, 9  Newborn Delivery   Birth date/time: 02/06/2021 18:32:00 Delivery type: Vaginal, Spontaneous     Feeding planned: breast   ---------- Margaretmary Eddy, CNM Certified Nurse Midwife [place]  [facility]

## 2020-12-13 ENCOUNTER — Other Ambulatory Visit: Payer: Self-pay

## 2020-12-13 ENCOUNTER — Observation Stay
Admission: EM | Admit: 2020-12-13 | Discharge: 2020-12-14 | Disposition: A | Discharge status: Home / Self Care | Payer: Medicaid Other | Attending: Obstetrics & Gynecology | Admitting: Obstetrics & Gynecology

## 2020-12-13 DIAGNOSIS — F17210 Nicotine dependence, cigarettes, uncomplicated: Secondary | ICD-10-CM | POA: Diagnosis not present

## 2020-12-13 DIAGNOSIS — O4700 False labor before 37 completed weeks of gestation, unspecified trimester: Secondary | ICD-10-CM | POA: Diagnosis present

## 2020-12-13 DIAGNOSIS — O99333 Smoking (tobacco) complicating pregnancy, third trimester: Secondary | ICD-10-CM | POA: Insufficient documentation

## 2020-12-13 DIAGNOSIS — Z3A32 32 weeks gestation of pregnancy: Secondary | ICD-10-CM | POA: Insufficient documentation

## 2020-12-13 LAB — FETAL FIBRONECTIN: Fetal Fibronectin: NEGATIVE

## 2020-12-13 LAB — URINALYSIS, ROUTINE W REFLEX MICROSCOPIC
Bilirubin Urine: NEGATIVE
Glucose, UA: NEGATIVE mg/dL
Hgb urine dipstick: NEGATIVE
Ketones, ur: 5 mg/dL — AB
Leukocytes,Ua: NEGATIVE
Nitrite: NEGATIVE
Protein, ur: NEGATIVE mg/dL
Specific Gravity, Urine: 1.024 (ref 1.005–1.030)
pH: 7 (ref 5.0–8.0)

## 2020-12-13 LAB — WET PREP, GENITAL
Clue Cells Wet Prep HPF POC: NONE SEEN
Sperm: NONE SEEN
Trich, Wet Prep: NONE SEEN
Yeast Wet Prep HPF POC: NONE SEEN

## 2020-12-13 LAB — CHLAMYDIA/NGC RT PCR (ARMC ONLY)
Chlamydia Tr: NOT DETECTED
N gonorrhoeae: NOT DETECTED

## 2020-12-13 MED ORDER — BETAMETHASONE SOD PHOS & ACET 6 (3-3) MG/ML IJ SUSP
INTRAMUSCULAR | Status: AC
  Filled 2020-12-13: qty 5

## 2020-12-13 MED ORDER — LACTATED RINGERS IV BOLUS
1000.0000 mL | Freq: Once | INTRAVENOUS | Status: AC
  Administered 2020-12-13: 1000 mL via INTRAVENOUS

## 2020-12-13 MED ORDER — TERBUTALINE SULFATE 1 MG/ML IJ SOLN
0.2500 mg | Freq: Once | INTRAMUSCULAR | Status: AC
  Administered 2020-12-13: 0.25 mg via SUBCUTANEOUS

## 2020-12-13 MED ORDER — LACTATED RINGERS IV SOLN: INTRAVENOUS | Status: DC

## 2020-12-13 MED ORDER — ACETAMINOPHEN 325 MG PO TABS
650.0000 mg | ORAL_TABLET | ORAL | Status: DC | PRN
  Administered 2020-12-13: 650 mg via ORAL
  Filled 2020-12-13: qty 2

## 2020-12-13 MED ORDER — BETAMETHASONE SOD PHOS & ACET 6 (3-3) MG/ML IJ SUSP
12.0000 mg | INTRAMUSCULAR | Status: AC
  Administered 2020-12-13 – 2020-12-14 (×2): 12 mg via INTRAMUSCULAR
  Filled 2020-12-13: qty 5

## 2020-12-13 MED ORDER — TERBUTALINE SULFATE 1 MG/ML IJ SOLN
INTRAMUSCULAR | Status: AC
  Filled 2020-12-13: qty 1

## 2020-12-13 MED ORDER — CALCIUM CARBONATE ANTACID 500 MG PO CHEW: 2.0000 | CHEWABLE_TABLET | ORAL | Status: DC | PRN

## 2020-12-13 NOTE — H&P (Signed)
Andrea Weiss is a 25 y.o. female. She is at [redacted]w[redacted]d gestation. Patient's last menstrual period was 04/27/2020 (exact date). Estimated Date of Delivery: 02/01/21  Prenatal care site: [facility]  Pregnancy complicated by: 1. Tobacco use in pregnancy - 1/4 PPD 2. Hx anxiety   Chief complaint: loss of mucus plug and cramping   Andrea Weiss presents to L&D with complaints losing her mucus plug this morning followed by period like cramping.  She states that the mucus discharge and cramping has gotten worse throughout the day.  She denies vaginal bleeding or recent intercourse.    S: Resting comfortably, no VB.no LOF,  Active fetal movement.   Maternal Medical History:  Past Medical Hx:  has a past medical history of Abdominal hernia, History of anemia, History of pica, History of urinary tract infection, and Medical history non-contributory.    Past Surgical Hx:  has a past surgical history that includes Wisdom tooth extraction (Bilateral) and Nexplanon insertion.   Allergies  Allergen Reactions  . 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 Authorizing Provider  acetaminophen (TYLENOL) 500 MG tablet Take 1,000 mg by mouth every 6 (six) hours as needed for mild pain or headache.   Yes [provider]  Prenatal Vit-Fe Fumarate-FA (MULTIVITAMIN-PRENATAL) 27-0.8 MG TABS tablet Take 2 tablets by mouth daily at 12 noon. 2 gummies per day   Yes [provider]  sertraline (ZOLOFT) 25 MG tablet Take 25 mg by mouth daily.   Yes [provider]  cetirizine (ZYRTEC) 10 MG tablet Take 1 tablet (10 mg total) by mouth daily. Patient not taking: No sig reported 12/27/17   Marylene Land, CNM  HYDROcodone-acetaminophen (NORCO) 5-325 MG tablet Take 1 tablet by mouth every 4 (four) hours as needed for moderate pain. Patient not taking: No sig reported 03/28/20   Willy Eddy, MD  hydrocortisone cream 0.5 % Apply 1 application topically 2 (two) times daily. Patient not taking: No sig reported 12/27/17   Marylene Land, CNM  ibuprofen (ADVIL,MOTRIN) 600 MG tablet Take 1 tablet (600 mg total) by mouth every 6 (six) hours. Patient not taking: No sig reported 11/19/17   Montez Morita, CNM    Social History: She  reports that she has quit smoking. Her smoking use included cigarettes. She smoked 0.25 packs per day. She has never used smokeless tobacco. She reports previous alcohol use. She reports previous drug use. Drug: Marijuana.  Family History: no history of gyn cancers  Review of Systems: A full review of systems was performed and negative except as noted in the HPI.    O:  BP 110/65 (BP Location: Left Arm)   Pulse 92   Temp 99 F (37.2 C) (Oral)   Resp 18   Ht 5\' 4"  (1.626 m)   Wt 60.3 kg   LMP 04/27/2020 (Exact Date) Comment: normal  BMI 22.83 kg/m  Results for orders placed or performed during the hospital encounter of 12/13/20 (from the past 48 hour(s))  Urinalysis, Routine w reflex microscopic Urine, Clean Catch   Collection Time: 12/13/20  8:05 PM  Result Value Ref Range   Color, Urine YELLOW (A) YELLOW   APPearance HAZY (A) CLEAR   Specific Gravity, Urine 1.024 1.005 - 1.030   pH 7.0 5.0 - 8.0   Glucose, UA NEGATIVE NEGATIVE mg/dL   Hgb urine dipstick NEGATIVE NEGATIVE  Bilirubin Urine NEGATIVE NEGATIVE   Ketones, ur 5 (A) NEGATIVE mg/dL   Protein, ur NEGATIVE NEGATIVE mg/dL   Nitrite NEGATIVE NEGATIVE   Leukocytes,Ua NEGATIVE NEGATIVE  Wet prep, genital   Collection Time: 12/13/20  8:38 PM  Result Value Ref Range   Yeast Wet Prep HPF POC NONE SEEN NONE SEEN   Trich, Wet Prep NONE SEEN NONE SEEN   Clue Cells Wet Prep HPF POC NONE SEEN NONE SEEN   WBC, Wet Prep HPF POC FEW (A) NONE SEEN   Sperm NONE SEEN      Constitutional: NAD, AAOx3  HE/ENT: extraocular movements grossly intact, moist mucous membranes CV:  RRR PULM: nl respiratory effort, CTABL     Abd: gravid, non-tender, non-distended, soft      Ext: Non-tender, Nonedmeatous   Psych: mood appropriate, speech normal Pelvic : moderate amount of physiologic discharge, cervical mucus , no bleeding present  SVE: Dilation: 2 Effacement (%): 50 Station: -2 Exam by:: Liahna Brickner CNM   Fetal Monitor: Baseline: 140 bpm Variability: moderate Accels: Present Decels: none Toco: regular, every 2-3 minutes  Category: I  Presentation: cephalic by bedside US   Assessment: 25 y.o. [redacted]w[redacted]d here for threatened preterm labor.  Principle diagnosis: Preterm contractions, threatened preterm labor    Plan:  Preterm contractions present, threatened preterm labor.   Fetal Wellbeing: Reassuring Cat 1 tracing.  Reactive NST   Cephalic presentation confirmed by bedside US   Betamethasone injection x 2 24 hours apart - first dose at 2115  Start IV fluid bolus    Terbutaline 0.25mg  subcutaneous x 1 dose given   Consider magnesium sulfate infusion for neuroprophylaxis and to allow time for steroid administration   NICU consult if she has cervical progression or other signs of labor.   FFN, GC/CT, wet prep, and UA are pending.    Dr. Feliberto Gottron updated on patient status.   ----- Margaretmary Eddy, CNM Certified Nurse Midwife [place]  [facility]

## 2020-12-13 NOTE — OB Triage Note (Signed)
Pt is a G3P1 and [redacted]w[redacted]d c/o backache and pains on her sides of her abdomen, ctx and cramping since 1000 this morning. Pt states her ctx have been approximately every 4 minutes and last for 1-2 minutes each. Pt states she lost her mucus plug this morning. Pt states she has not taken any medicine for pain and has stayed hydrated today. Pt states she feels pressure in her vagina that burns. Pt reports positive FM and denies VB and LOF. Pt states last intercourse was 2 days. Pt denies vaginal itching and denies burning with urination. Monitors applied and assessing. VSS.

## 2020-12-14 LAB — GROUP B STREP BY PCR: Group B strep by PCR: NEGATIVE

## 2020-12-14 NOTE — Discharge Summary (Signed)
Patient ID: Andrea Weiss MRN: [id number] DOB/AGE: [birth year] 24 y.o.  Admit date: 12/13/2020 Discharge date: 12/14/2020  Admission Diagnoses: Preterm Contractions  Discharge Diagnoses: Preterm contractions resolved  Prenatal Procedures: NST  Consults: Neonatology, Maternal Fetal Medicine  Significant Diagnostic Studies:  Results for orders placed or performed during the hospital encounter of 12/13/20 (from the past 168 hour(s))  Urinalysis, Routine w reflex microscopic Urine, Clean Catch   Collection Time: 12/13/20  8:05 PM  Result Value Ref Range   Color, Urine YELLOW (A) YELLOW   APPearance HAZY (A) CLEAR   Specific Gravity, Urine 1.024 1.005 - 1.030   pH 7.0 5.0 - 8.0   Glucose, UA NEGATIVE NEGATIVE mg/dL   Hgb urine dipstick NEGATIVE NEGATIVE   Bilirubin Urine NEGATIVE NEGATIVE   Ketones, ur 5 (A) NEGATIVE mg/dL   Protein, ur NEGATIVE NEGATIVE mg/dL   Nitrite NEGATIVE NEGATIVE   Leukocytes,Ua NEGATIVE NEGATIVE  Wet prep, genital   Collection Time: 12/13/20  8:38 PM  Result Value Ref Range   Yeast Wet Prep HPF POC NONE SEEN NONE SEEN   Trich, Wet Prep NONE SEEN NONE SEEN   Clue Cells Wet Prep HPF POC NONE SEEN NONE SEEN   WBC, Wet Prep HPF POC FEW (A) NONE SEEN   Sperm NONE SEEN   Chlamydia/NGC rt PCR ([facility] only)   Collection Time: 12/13/20  8:38 PM   Specimen: GU  Result Value Ref Range   Specimen source GC/Chlam ENDOCERVICAL    Chlamydia Tr NOT DETECTED NOT DETECTED   N gonorrhoeae NOT DETECTED NOT DETECTED  Fetal fibronectin   Collection Time: 12/13/20  8:38 PM  Result Value Ref Range   Fetal Fibronectin NEGATIVE NEGATIVE  Group B strep by PCR   Collection Time: 12/14/20  8:46 AM   Specimen: Vaginal/Rectal; Genital  Result Value Ref Range   Group B strep by PCR NEGATIVE NEGATIVE    Treatments: IV hydration, steroids: BMX and terbutaline  Hospital Course:  This is a 25 y.o. G3P1011 with IUP at [redacted]w[redacted]d admitted for preterm contractions, noted to have a  cervical exam of 2-2.5 / 50 / -2.  No leaking of fluid and no bleeding.  She was initially given terbutaline for tocolysis and also received betamethasone x 2 doses.  She was observed, fetal heart rate monitoring remained reassuring, and she had no signs/symptoms of progressing preterm labor or other maternal-fetal concerns.  Her cervical exam was unchanged from admission.  She was deemed stable for discharge to home with outpatient follow up.  Discharge Physical Exam:  BP 106/70   Pulse 87   Temp 97.7 F (36.5 C) (Oral)   Resp 16   Ht 5\' 4"  (1.626 m)   Wt 60.3 kg   LMP 04/27/2020 (Exact Date) Comment: normal  BMI 22.83 kg/m   General: NAD CV: RRR Pulm: CTABL, nl effort ABD: s/nd/nt, gravid DVT Evaluation: LE non-ttp, no evidence of DVT on exam.  NST: FHR baseline: 125 bpm Variability: moderate Accelerations: yes Decelerations: none Time: 30 minutes Category/reactivity: reactive  TOCO: quiet   Discharge Condition: Stable  Disposition: Discharge disposition: 01-Home or Self Care        Allergies as of 12/14/2020      Reactions   Mango Flavor Hives, Swelling   Allergy to mangos      Medication List    STOP taking these medications   HYDROcodone-acetaminophen 5-325 MG tablet Commonly known as: Norco   hydrocortisone cream 0.5 %   ibuprofen 600 MG tablet Commonly known  as: ADVIL     TAKE these medications   acetaminophen 500 MG tablet Commonly known as: TYLENOL Take 1,000 mg by mouth every 6 (six) hours as needed for mild pain or headache.   cetirizine 10 MG tablet Commonly known as: ZYRTEC Take 1 tablet (10 mg total) by mouth daily.   multivitamin-prenatal 27-0.8 MG Tabs tablet Take 2 tablets by mouth daily at 12 noon. 2 gummies per day   sertraline 25 MG tablet Commonly known as: ZOLOFT Take 25 mg by mouth daily.       Follow-up Information    [facility]. Schedule an appointment as soon as possible for a visit in 3 day(s).    Contact information: [address] [place] [address] [phone]              Signed:  Quillian Quince 12/14/2020 10:08 PM

## 2020-12-14 NOTE — Progress Notes (Signed)
Pt states she only feels ctx occasionally and they are just uncomfortable. Pt understands her plan of care to receive BMZ at 2100 and be discharged.

## 2020-12-14 NOTE — Progress Notes (Signed)
Ulyses Southward CNM in department and reviewed EFM tracing. Verbal order given to D/C fetal monitoring and continue with Toco until second BMZ tonight. Pt is currently comfortable and waiting on her breakfast to arrive.

## 2020-12-14 NOTE — Progress Notes (Signed)
Pt got up to bathroom. Pt states "I am not feeling any cramps and don't have any pain." Continuous tocometry in place. Pt denies any needs at this time.

## 2020-12-14 NOTE — Progress Notes (Signed)
Antepartum Progress Note  Andrea Weiss is a 25 y.o. G3P1011 at [redacted]w[redacted]d by LMP admitted for threatened preterm labor  Subjective: states that contractions felt better overnight but feel more painful this morning   Objective: BP 110/67 (BP Location: Left Arm)   Pulse 68   Temp 98 F (36.7 C) (Oral)   Resp 15   Ht 5\' 4"  (1.626 m)   Wt 60.3 kg   LMP 04/27/2020 (Exact Date) Comment: normal  BMI 22.83 kg/m  Notable VS details: reviewed   Fetal Assessment: FHT:  FHR: 130 bpm, variability: moderate,  accelerations:  Present,  decelerations:  Absent Category/reactivity:  Category I UC:   irregular, every 2-7 minutes SVE:   2-3/50/-2 Membrane status: Intact Amniotic color: N/A  Labs: Lab Results  Component Value Date   WBC 7.6 09/16/2020   HGB 11.3 (L) 09/16/2020   HCT 34.5 (L) 09/16/2020   MCV 83.7 09/16/2020   PLT 147 (L) 09/16/2020    Assessment / Plan:  1. Continue antepartum monitoring until at least 2nd dose of betamethasone.  2. EFM cat 1, reassuring  3. 2nd dose of steroids due at 2115 tonight  2116, Gustavo Lah 12/14/2020, 7:53 AM

## 2020-12-14 NOTE — Progress Notes (Signed)
Pt to be d/c home and to self care. Pt given teaching on when to seek medical attention (LOF, VB and decreased FM, etc..). Pt verbalized understanding of d/c instructions.   ?

## 2020-12-16 ENCOUNTER — Other Ambulatory Visit: Payer: Self-pay

## 2020-12-16 ENCOUNTER — Encounter: Payer: Self-pay | Admitting: Obstetrics and Gynecology

## 2020-12-16 ENCOUNTER — Observation Stay
Admission: EM | Admit: 2020-12-16 | Discharge: 2020-12-16 | Disposition: A | Discharge status: Home / Self Care | Payer: Medicaid Other

## 2020-12-16 DIAGNOSIS — Z87891 Personal history of nicotine dependence: Secondary | ICD-10-CM | POA: Diagnosis not present

## 2020-12-16 DIAGNOSIS — Z3A32 32 weeks gestation of pregnancy: Secondary | ICD-10-CM | POA: Diagnosis not present

## 2020-12-16 DIAGNOSIS — B373 Candidiasis of vulva and vagina: Secondary | ICD-10-CM | POA: Diagnosis not present

## 2020-12-16 DIAGNOSIS — O4290 Premature rupture of membranes, unspecified as to length of time between rupture and onset of labor, unspecified weeks of gestation: Secondary | ICD-10-CM | POA: Diagnosis present

## 2020-12-16 DIAGNOSIS — O4193X1 Disorder of amniotic fluid and membranes, unspecified, third trimester, fetus 1: Secondary | ICD-10-CM | POA: Diagnosis present

## 2020-12-16 DIAGNOSIS — O23593 Infection of other part of genital tract in pregnancy, third trimester: Secondary | ICD-10-CM | POA: Insufficient documentation

## 2020-12-16 LAB — WET PREP, GENITAL
Clue Cells Wet Prep HPF POC: NONE SEEN
Sperm: NONE SEEN
Trich, Wet Prep: NONE SEEN
Yeast Wet Prep HPF POC: NONE SEEN

## 2020-12-16 LAB — RUPTURE OF MEMBRANE (ROM)PLUS: Rom Plus: NEGATIVE

## 2020-12-16 MED ORDER — TERBUTALINE SULFATE 1 MG/ML IJ SOLN
0.2500 mg | Freq: Once | INTRAMUSCULAR | Status: AC
  Administered 2020-12-16: 0.25 mg via SUBCUTANEOUS
  Filled 2020-12-16: qty 1

## 2020-12-16 MED ORDER — CALCIUM CARBONATE ANTACID 500 MG PO CHEW: 2.0000 | CHEWABLE_TABLET | ORAL | Status: DC | PRN

## 2020-12-16 MED ORDER — ACETAMINOPHEN 325 MG PO TABS: 650.0000 mg | ORAL_TABLET | ORAL | Status: DC | PRN

## 2020-12-16 MED ORDER — FLUCONAZOLE 100 MG PO TABS: 100.0000 mg | ORAL_TABLET | ORAL | 0 refills | Status: AC

## 2020-12-16 NOTE — Discharge Summary (Signed)
Andrea Weiss is a 25 y.o. female. She is at [redacted]w[redacted]d gestation. Patient's last menstrual period Weiss 05/04/2020. Estimated Date of Delivery: 02/08/21  Prenatal care site: [facility]  Pregnancy complicated by:  1. Threatened preterm labor  2. 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 Hx anxiety  Chief complaint: leakage of fluid  Andrea Weiss presents to L&D today for leakage of fluid.  She states that she felt a gush of fluid at home followed by smaller gushes.  Denies vaginal bleeding or regular contractions.  Denies recent intercourse.    Andrea Weiss for 24 hours on Sunday 12/13/20 and received betamethasone x 2.   S: Resting comfortably. no CTX, no VB.,  Active fetal movement.   Maternal Medical History:  Past Medical Hx:  has a past medical history of Abdominal hernia, History of anemia, History of pica, History of urinary tract infection, and Medical history non-contributory.    Past Surgical Hx:  has a past surgical history that includes Wisdom tooth extraction (Bilateral) and Nexplanon insertion.   Allergies  Allergen Reactions  . 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 Authorizing Provider  fluconazole (DIFLUCAN) 100 MG tablet Take 1 tablet (100 mg total) by mouth once a week for 2 doses. 12/16/20 12/24/20 Yes Gustavo Lah, CNM  Prenatal Vit-Fe Fumarate-FA (MULTIVITAMIN-PRENATAL) 27-0.8 MG TABS tablet Take 2 tablets by mouth daily at 12 noon. 2 gummies per day   Yes [provider]  sertraline (ZOLOFT) 25 MG tablet Take 25 mg by mouth daily.   Yes [provider]  acetaminophen (TYLENOL) 500 MG tablet Take 1,000 mg by mouth every 6 (six) hours as needed for mild pain or headache. Patient not taking: Reported on 12/16/2020    [provider]  cetirizine (ZYRTEC) 10 MG tablet Take 1 tablet (10 mg total) by mouth daily. Patient not  taking: No sig reported 12/27/17   Marylene Land, CNM    Social History: She  reports that she quit smoking about 12 months ago. Her smoking use included cigarettes. She smoked 0.25 packs per day. She has never used smokeless tobacco. She reports previous alcohol use. She reports previous drug use. Drug: Marijuana.  Family History:  no history of gyn cancers  Review of Systems: A full review of systems Weiss performed and negative except as noted in the HPI.    O:  BP 116/65 (BP Location: Right Arm)   Pulse 90   Temp 97.9 F (36.6 C) (Oral)   Resp 16   LMP 05/04/2020 Comment: normal Results for orders placed or performed during the hospital encounter of 12/16/20 (from the past 48 hour(s))  Wet prep, genital   Collection Time: 12/16/20  1:02 PM  Result Value Ref Range   Yeast Wet Prep HPF POC NONE SEEN NONE SEEN   Trich, Wet Prep NONE SEEN NONE SEEN   Clue Cells Wet Prep HPF POC NONE SEEN NONE SEEN   WBC, Wet Prep HPF POC FEW (A) NONE SEEN   Sperm NONE SEEN   ROM Plus ([facility] only)   Collection Time: 12/16/20  1:02 PM  Result Value Ref Range   Rom Plus NEGATIVE      Constitutional: NAD, AAOx3  HE/ENT: extraocular movements grossly intact, moist mucous membranes CV: RRR PULM: nl respiratory effort, CTABL     Abd: gravid, non-tender, non-distended, soft  Ext: Non-tender, Nonedmeatous   Psych: mood appropriate, speech normal Pelvic : No pooling, moderate amount of thick, curdy white vaginal discharge present SVE: Dilation: 2.5 Effacement (%): 60 Station: -1 Presentation: Vertex Exam by:: Tami Lin, CNM  Cervix unchanged from last exam    Fetal Monitor: Baseline: 150 bpm Variability: moderate Accels: Present Decels: none Toco: 2-3 min, mild contractions prior to Terb, occasional after terb given   Category: I  AFI - 14.27 cm Presentation: cephalic by [place]   Assessment: 25 y.o. [redacted]w[redacted]d here for antenatal surveillance during pregnancy.  Principle diagnosis:  Preterm contractions, vaginal yeast infection in pregnancy    Plan:  Labor: not present.   Fetal Wellbeing: Reassuring Cat 1 tracing.  Reactive NST   ROM + negative for amniotic fluid   Fern negative   Wet prep negative for vaginal infections but yeast seen on exam and on slide.    Rx for diflucan sent to pharmacy.    AFI reassuring   Continue PTL precautions   D/c home stable, precautions reviewed, follow-up as scheduled.   ----- Margaretmary Eddy, CNM Certified Nurse Midwife [place]  [facility]

## 2020-12-28 ENCOUNTER — Encounter: Payer: Self-pay | Admitting: Obstetrics and Gynecology

## 2020-12-28 ENCOUNTER — Other Ambulatory Visit: Payer: Self-pay

## 2020-12-28 ENCOUNTER — Observation Stay
Admission: EM | Admit: 2020-12-28 | Discharge: 2020-12-28 | Disposition: A | Discharge status: Home / Self Care | Payer: Medicaid Other | Attending: Obstetrics and Gynecology | Admitting: Obstetrics and Gynecology

## 2020-12-28 DIAGNOSIS — Z3A34 34 weeks gestation of pregnancy: Secondary | ICD-10-CM | POA: Diagnosis not present

## 2020-12-28 DIAGNOSIS — Z3490 Encounter for supervision of normal pregnancy, unspecified, unspecified trimester: Secondary | ICD-10-CM

## 2020-12-28 DIAGNOSIS — Z87891 Personal history of nicotine dependence: Secondary | ICD-10-CM | POA: Insufficient documentation

## 2020-12-28 DIAGNOSIS — O4703 False labor before 37 completed weeks of gestation, third trimester: Secondary | ICD-10-CM | POA: Diagnosis present

## 2020-12-28 LAB — URINALYSIS, ROUTINE W REFLEX MICROSCOPIC
Bacteria, UA: NONE SEEN
Bilirubin Urine: NEGATIVE
Glucose, UA: NEGATIVE mg/dL
Ketones, ur: NEGATIVE mg/dL
Leukocytes,Ua: NEGATIVE
Nitrite: NEGATIVE
Protein, ur: NEGATIVE mg/dL
Specific Gravity, Urine: 1.015 (ref 1.005–1.030)
pH: 7 (ref 5.0–8.0)

## 2020-12-28 MED ORDER — TERBUTALINE SULFATE 1 MG/ML IJ SOLN
0.2500 mg | Freq: Once | INTRAMUSCULAR | Status: AC
  Administered 2020-12-28: 0.25 mg via SUBCUTANEOUS

## 2020-12-28 MED ORDER — LACTATED RINGERS IV SOLN: INTRAVENOUS | Status: DC

## 2020-12-28 MED ORDER — TERBUTALINE SULFATE 1 MG/ML IJ SOLN
INTRAMUSCULAR | Status: AC
  Filled 2020-12-28: qty 1

## 2020-12-28 MED ORDER — LACTATED RINGERS IV BOLUS
1000.0000 mL | Freq: Once | INTRAVENOUS | Status: AC
  Administered 2020-12-28: 1000 mL via INTRAVENOUS

## 2020-12-28 NOTE — OB Triage Note (Addendum)
Pt is a G3P1 and [redacted]w[redacted]d c/o ctx every 5-7 minutes for the last hour. Pt rates ctx pain 8/10 on a 0-10 pain scale. Pt states she was laying on the couch when they started. Pt denies recent intercourse. Pt confirms positive fetal movement and denies VB and LOF. VSS. Monitors applied and assessing.

## 2020-12-28 NOTE — Discharge Summary (Signed)
Takeira Yanes is a 25 y.o. female. She is at [redacted]w[redacted]d gestation. Patient's last menstrual period was 05/04/2020. Estimated Date of Delivery: 02/08/21  Prenatal care site: [facility]  Current pregnancy complicated by:  1. Threatened preterm labor  2. 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 Hx anxiety  Chief complaint: increased contractions this evening for about an hour. No recent IC, no VB or LOF. Active FM. Reports UCs q5-27min, 8/10 pain.   S: Resting comfortably. no VB.no LOF,  Active fetal movement.  Denies: HA, visual changes, SOB, or RUQ/epigastric pain  Maternal Medical History:   Past Medical History:  Diagnosis Date  . Abdominal hernia   . History of anemia   . History of pica   . History of urinary tract infection   . Medical history non-contributory     Past Surgical History:  Procedure Laterality Date  . Nexplanon insertion    . WISDOM TOOTH EXTRACTION Bilateral     Allergies  Allergen Reactions  . 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 Authorizing Provider  Prenatal Vit-Fe Fumarate-FA (MULTIVITAMIN-PRENATAL) 27-0.8 MG TABS tablet Take 2 tablets by mouth daily at 12 noon. 2 gummies per day   Yes [provider]  sertraline (ZOLOFT) 25 MG tablet Take 25 mg by mouth daily.   Yes [provider]  acetaminophen (TYLENOL) 500 MG tablet Take 1,000 mg by mouth every 6 (six) hours as needed for mild pain or headache. Patient not taking: No sig reported    [provider]  cetirizine (ZYRTEC) 10 MG tablet Take 1 tablet (10 mg total) by mouth daily. Patient not taking: No sig reported 12/27/17   Marylene Land, CNM      Social History: She  reports that she quit smoking about 13 months ago. Her smoking use included cigarettes. She smoked 0.25 packs per day. She has never used smokeless tobacco. She reports previous alcohol use.  She reports previous drug use. Drug: Marijuana.  Family History:  no history of gyn cancers  Review of Systems: A full review of systems was performed and negative except as noted in the HPI.     O:   BP 120/71   Pulse 93   Temp 97.9 F (36.6 C) (Oral)   Resp 16   Ht 5\' 4"  (1.626 m)   Wt 60.3 kg   LMP 05/04/2020 Comment: normal  BMI 22.83 kg/m  Results for orders placed or performed during the hospital encounter of 12/28/20 (from the past 48 hour(s))  Urinalysis, Routine w reflex microscopic Urine, Clean Catch   Collection Time: 12/28/20  1:15 AM  Result Value Ref Range   Color, Urine YELLOW (A) YELLOW   APPearance CLEAR (A) CLEAR   Specific Gravity, Urine 1.015 1.005 - 1.030   pH 7.0 5.0 - 8.0   Glucose, UA NEGATIVE NEGATIVE mg/dL   Hgb urine dipstick SMALL (A) NEGATIVE   Bilirubin Urine NEGATIVE NEGATIVE   Ketones, ur NEGATIVE NEGATIVE mg/dL   Protein, ur NEGATIVE NEGATIVE mg/dL   Nitrite NEGATIVE NEGATIVE   Leukocytes,Ua NEGATIVE NEGATIVE   RBC / HPF 0-5 0 - 5 RBC/hpf   WBC, UA 0-5 0 - 5 WBC/hpf   Bacteria, UA NONE SEEN NONE SEEN   Squamous Epithelial / LPF 0-5 0 - 5   Mucus PRESENT      Constitutional: NAD, AAOx3  HE/ENT: extraocular movements  grossly intact, moist mucous membranes CV: RRR PULM: nl respiratory effort, CTABL     Abd: gravid, non-tender, non-distended, soft      Ext: Non-tender, Nonedematous   Psych: mood appropriate, speech normal Pelvic: SVE per RN, unchanged from previous  Dilation: 2.5 Effacement (%): 50 Cervical Position: Posterior Station: -2 Presentation: Vertex Exam by:: Mordecai Maes RN   Fetal  monitoring: Cat I Appropriate for GA Baseline: 130bpm Variability: moderate Accelerations: present x >2 Decelerations absent  Toco: initially q2-35min, given IV bolus and terbutaline.      A/P: 25 y.o. [redacted]w[redacted]d here for antenatal surveillance for preterm contractions.   Principle Diagnosis:  Preterm contractions.    Labor: not present.  No cervical change  Fetal Wellbeing: Reassuring Cat 1 tracing.  Reactive NST   D/c home stable, precautions reviewed, follow-up as scheduled.    Randa Ngo, CNM 12/28/2020  4:37 AM

## 2020-12-28 NOTE — Progress Notes (Signed)
Pt states her pain level is 0/10 on a 0-10 pain scale. Pt states she has not felt any contractions in the last 10 minutes. IV fluid running and PO fluids encouraged.

## 2021-01-08 ENCOUNTER — Observation Stay
Admission: EM | Admit: 2021-01-08 | Discharge: 2021-01-08 | Disposition: A | Discharge status: Home / Self Care | Payer: Medicaid Other

## 2021-01-08 DIAGNOSIS — Z87891 Personal history of nicotine dependence: Secondary | ICD-10-CM | POA: Insufficient documentation

## 2021-01-08 DIAGNOSIS — Z3A35 35 weeks gestation of pregnancy: Secondary | ICD-10-CM | POA: Insufficient documentation

## 2021-01-08 DIAGNOSIS — D649 Anemia, unspecified: Secondary | ICD-10-CM | POA: Insufficient documentation

## 2021-01-08 DIAGNOSIS — Z20822 Contact with and (suspected) exposure to covid-19: Secondary | ICD-10-CM | POA: Insufficient documentation

## 2021-01-08 DIAGNOSIS — Z87440 Personal history of urinary (tract) infections: Secondary | ICD-10-CM | POA: Insufficient documentation

## 2021-01-08 DIAGNOSIS — O4700 False labor before 37 completed weeks of gestation, unspecified trimester: Secondary | ICD-10-CM | POA: Diagnosis present

## 2021-01-08 DIAGNOSIS — F983 Pica of infancy and childhood: Secondary | ICD-10-CM | POA: Diagnosis not present

## 2021-01-08 LAB — CBC WITH DIFFERENTIAL/PLATELET
Abs Immature Granulocytes: 0.14 10*3/uL — ABNORMAL HIGH (ref 0.00–0.07)
Basophils Absolute: 0 10*3/uL (ref 0.0–0.1)
Basophils Relative: 0 %
Eosinophils Absolute: 0.1 10*3/uL (ref 0.0–0.5)
Eosinophils Relative: 1 %
HCT: 34.3 % — ABNORMAL LOW (ref 36.0–46.0)
Hemoglobin: 11.7 g/dL — ABNORMAL LOW (ref 12.0–15.0)
Immature Granulocytes: 2 %
Lymphocytes Relative: 26 %
Lymphs Abs: 1.8 10*3/uL (ref 0.7–4.0)
MCH: 28.8 pg (ref 26.0–34.0)
MCHC: 34.1 g/dL (ref 30.0–36.0)
MCV: 84.5 fL (ref 80.0–100.0)
Monocytes Absolute: 0.4 10*3/uL (ref 0.1–1.0)
Monocytes Relative: 6 %
Neutro Abs: 4.4 10*3/uL (ref 1.7–7.7)
Neutrophils Relative %: 65 %
Platelets: 170 10*3/uL (ref 150–400)
RBC: 4.06 MIL/uL (ref 3.87–5.11)
RDW: 14.4 % (ref 11.5–15.5)
WBC: 6.8 10*3/uL (ref 4.0–10.5)
nRBC: 0 % (ref 0.0–0.2)

## 2021-01-08 LAB — URINALYSIS, COMPLETE (UACMP) WITH MICROSCOPIC
Bacteria, UA: NONE SEEN
Bilirubin Urine: NEGATIVE
Glucose, UA: NEGATIVE mg/dL
Hgb urine dipstick: NEGATIVE
Ketones, ur: NEGATIVE mg/dL
Leukocytes,Ua: NEGATIVE
Nitrite: NEGATIVE
Protein, ur: NEGATIVE mg/dL
Specific Gravity, Urine: 1.003 — ABNORMAL LOW (ref 1.005–1.030)
pH: 8 (ref 5.0–8.0)

## 2021-01-08 LAB — RESP PANEL BY RT-PCR (FLU A&B, COVID) ARPGX2
Influenza A by PCR: NEGATIVE
Influenza B by PCR: NEGATIVE
SARS Coronavirus 2 by RT PCR: NEGATIVE

## 2021-01-08 LAB — TYPE AND SCREEN
ABO/RH(D): O POS
Antibody Screen: NEGATIVE

## 2021-01-08 LAB — GROUP B STREP BY PCR: Group B strep by PCR: NEGATIVE

## 2021-01-08 MED ORDER — ACETAMINOPHEN 500 MG PO TABS: 1000.0000 mg | ORAL_TABLET | Freq: Four times a day (QID) | ORAL | Status: DC | PRN

## 2021-01-08 MED ORDER — HYDROXYZINE HCL 25 MG PO TABS
25.0000 mg | ORAL_TABLET | ORAL | Status: AC
  Administered 2021-01-08: 25 mg via ORAL
  Filled 2021-01-08: qty 1

## 2021-01-08 MED ORDER — PRENATAL MULTIVITAMIN CH: 1.0000 | ORAL_TABLET | Freq: Every day | ORAL | Status: DC

## 2021-01-08 MED ORDER — LACTATED RINGERS IV BOLUS
1000.0000 mL | Freq: Once | INTRAVENOUS | Status: AC
  Administered 2021-01-08: 1000 mL via INTRAVENOUS

## 2021-01-08 MED ORDER — CALCIUM CARBONATE ANTACID 500 MG PO CHEW: 2.0000 | CHEWABLE_TABLET | ORAL | Status: DC | PRN

## 2021-01-08 NOTE — Discharge Instructions (Signed)

## 2021-01-08 NOTE — Discharge Summary (Signed)
Andrea Weiss is a 25 y.o. female. She is at [redacted]w[redacted]d gestation. Patient's last menstrual period was 05/04/2020. Estimated Date of Delivery: 02/08/21  Prenatal care site: [facility]  Chief complaint: contractions   Andrea Weiss presents to L&D with contractions that started around 1600 today.  She states that they started while she was out doing errands with her husband.  Denies LOF or vaginal bleeding.   S: Resting comfortably, no VB.no LOF,  Active fetal movement.   Maternal Medical History:  Past Medical Hx:  has a past medical history of Abdominal hernia, History of anemia, History of pica, History of urinary tract infection, and Medical history non-contributory.    Past Surgical Hx:  has a past surgical history that includes Wisdom tooth extraction (Bilateral) and Nexplanon insertion.   Allergies  Allergen Reactions  . 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 Authorizing Provider  acetaminophen (TYLENOL) 500 MG tablet Take 1,000 mg by mouth every 6 (six) hours as needed for mild pain or headache. Patient not taking: No sig reported    [provider]  cetirizine (ZYRTEC) 10 MG tablet Take 1 tablet (10 mg total) by mouth daily. Patient not taking: No sig reported 12/27/17   Marylene Land, CNM  Prenatal Vit-Fe Fumarate-FA (MULTIVITAMIN-PRENATAL) 27-0.8 MG TABS tablet Take 2 tablets by mouth daily at 12 noon. 2 gummies per day    [provider]  sertraline (ZOLOFT) 25 MG tablet Take 25 mg by mouth daily.    [provider]    Social History: She  reports that she quit smoking about 13 months ago. Her smoking use included cigarettes. She smoked 0.25 packs per day. She has never used smokeless tobacco. She reports previous alcohol use. She reports previous drug use. Drug: Marijuana.  Family History: non-contribuatory, no history of gyn  cancers  Review of Systems: A full review of systems was performed and negative except as noted in the HPI.    O:  BP (!) 97/57   Pulse 87   Temp 98.2 F (36.8 C) (Oral)   Resp 17   LMP 05/04/2020 Comment: normal Results for orders placed or performed during the hospital encounter of 01/08/21 (from the past 48 hour(s))  Group B strep by PCR   Collection Time: 01/08/21  5:18 PM   Specimen: Vaginal/Rectal; Genital  Result Value Ref Range   Group B strep by PCR NEGATIVE NEGATIVE  Urinalysis, Complete w Microscopic Urine, Clean Catch   Collection Time: 01/08/21  5:26 PM  Result Value Ref Range   Color, Urine STRAW (A) YELLOW   APPearance CLEAR (A) CLEAR   Specific Gravity, Urine 1.003 (L) 1.005 - 1.030   pH 8.0 5.0 - 8.0   Glucose, UA NEGATIVE NEGATIVE mg/dL   Hgb urine dipstick NEGATIVE NEGATIVE   Bilirubin Urine NEGATIVE NEGATIVE   Ketones, ur NEGATIVE NEGATIVE mg/dL   Protein, ur NEGATIVE NEGATIVE mg/dL   Nitrite NEGATIVE NEGATIVE   Leukocytes,Ua NEGATIVE NEGATIVE   RBC / HPF 0-5 0 - 5 RBC/hpf   WBC, UA 0-5 0 - 5 WBC/hpf   Bacteria, UA NONE SEEN NONE SEEN   Squamous Epithelial / LPF 0-5 0 - 5  Resp Panel by RT-PCR (Flu A&B, Covid) Nasopharyngeal Swab   Collection Time: 01/08/21  5:39 PM   Specimen: Nasopharyngeal Swab; Nasopharyngeal(NP) swabs in vial transport medium  Result Value Ref Range  SARS Coronavirus 2 by RT PCR NEGATIVE NEGATIVE   Influenza A by PCR NEGATIVE NEGATIVE   Influenza B by PCR NEGATIVE NEGATIVE  CBC with Differential/Platelet   Collection Time: 01/08/21  5:39 PM  Result Value Ref Range   WBC 6.8 4.0 - 10.5 K/uL   RBC 4.06 3.87 - 5.11 MIL/uL   Hemoglobin 11.7 (L) 12.0 - 15.0 g/dL   HCT 37.1 (L) 69.6 - 78.9 %   MCV 84.5 80.0 - 100.0 fL   MCH 28.8 26.0 - 34.0 pg   MCHC 34.1 30.0 - 36.0 g/dL   RDW 38.1 01.7 - 51.0 %   Platelets 170 150 - 400 K/uL   nRBC 0.0 0.0 - 0.2 %   Neutrophils Relative % 65 %   Neutro Abs 4.4 1.7 - 7.7 K/uL   Lymphocytes  Relative 26 %   Lymphs Abs 1.8 0.7 - 4.0 K/uL   Monocytes Relative 6 %   Monocytes Absolute 0.4 0.1 - 1.0 K/uL   Eosinophils Relative 1 %   Eosinophils Absolute 0.1 0.0 - 0.5 K/uL   Basophils Relative 0 %   Basophils Absolute 0.0 0.0 - 0.1 K/uL   Immature Granulocytes 2 %   Abs Immature Granulocytes 0.14 (H) 0.00 - 0.07 K/uL  Type and screen [facility]   Collection Time: 01/08/21  5:40 PM  Result Value Ref Range   ABO/RH(D) O POS    Antibody Screen NEG    Sample Expiration      01/11/2021,2359 Performed at [facility], [address] [place], [place] [address]      Constitutional: NAD, AAOx3  HE/ENT: extraocular movements grossly intact, moist mucous membranes CV: RRR PULM: nl respiratory effort, CTABL     Abd: gravid, non-tender, non-distended, soft      Ext: Non-tender, Nonedmeatous   Psych: mood appropriate, speech normal Pelvic : moderate amount of physiologic discharge SVE: Dilation: 2.5 Effacement (%): 70 Cervical Position: Posterior Station: -1 Presentation: Vertex Exam by:: A Andrea Weiss, CNM   Unchanged from previous exam 4 hours ago  Fetal Monitor: Baseline: 130 bpm Variability: moderate Accels: Present Decels: none Toco: regular, every 2-5 minutes  Category: I   Assessment: 25 y.o. [redacted]w[redacted]d here for antenatal surveillance during pregnancy.  Principle diagnosis: Preterm contractions    Plan:  Active labor - not present   Fetal Wellbeing: Reassuring Cat 1 tracing.  Reactive NST  Offered to observe overnight d/t continued contractions despite no cervical change over 4 hours.  Karolyn requesting to discharge home.  States "I just want to go home to my own bed."  Declines terbutaline but accepts vistaril prior to discharge.    Strict labor precautions reviewed.   D/c home stable, precautions reviewed, follow-up as scheduled.   ----- Andrea Weiss, CNM Certified Nurse Midwife [place]  [facility]

## 2021-01-08 NOTE — OB Triage Note (Signed)
Pt is a G3P1011 at [redacted]w[redacted]d that presents from the ED with c/o ctx every 2 minutes. SVE 2/70/0 and EFM applied with initial FHT 135. Pt denies VB, LOF and states Positive FM. Pt denies recent intercourse. IV started and a liter fluid bolus given. Pt will be a labor evaluation.

## 2021-01-09 LAB — RPR: RPR Ser Ql: NONREACTIVE

## 2021-01-10 LAB — URINE CULTURE

## 2021-01-15 LAB — OB RESULTS CONSOLE HIV ANTIBODY (ROUTINE TESTING): HIV: NONREACTIVE

## 2021-01-16 ENCOUNTER — Other Ambulatory Visit: Payer: Self-pay

## 2021-01-16 ENCOUNTER — Encounter: Payer: Self-pay | Admitting: Obstetrics and Gynecology

## 2021-01-16 ENCOUNTER — Observation Stay
Admission: EM | Admit: 2021-01-16 | Discharge: 2021-01-16 | Disposition: A | Discharge status: Home / Self Care | Payer: Medicaid Other | Attending: Certified Nurse Midwife | Admitting: Certified Nurse Midwife

## 2021-01-16 DIAGNOSIS — Z20822 Contact with and (suspected) exposure to covid-19: Secondary | ICD-10-CM | POA: Diagnosis not present

## 2021-01-16 DIAGNOSIS — Z3A36 36 weeks gestation of pregnancy: Secondary | ICD-10-CM | POA: Diagnosis not present

## 2021-01-16 DIAGNOSIS — O4703 False labor before 37 completed weeks of gestation, third trimester: Principal | ICD-10-CM | POA: Insufficient documentation

## 2021-01-16 DIAGNOSIS — O479 False labor, unspecified: Secondary | ICD-10-CM | POA: Diagnosis present

## 2021-01-16 HISTORY — DX: Anxiety disorder, unspecified: F41.9

## 2021-01-16 LAB — TYPE AND SCREEN
ABO/RH(D): O POS
Antibody Screen: NEGATIVE

## 2021-01-16 LAB — CBC
HCT: 32.6 % — ABNORMAL LOW (ref 36.0–46.0)
Hemoglobin: 10.7 g/dL — ABNORMAL LOW (ref 12.0–15.0)
MCH: 28.1 pg (ref 26.0–34.0)
MCHC: 32.8 g/dL (ref 30.0–36.0)
MCV: 85.6 fL (ref 80.0–100.0)
Platelets: 183 10*3/uL (ref 150–400)
RBC: 3.81 MIL/uL — ABNORMAL LOW (ref 3.87–5.11)
RDW: 14.4 % (ref 11.5–15.5)
WBC: 7.8 10*3/uL (ref 4.0–10.5)
nRBC: 0 % (ref 0.0–0.2)

## 2021-01-16 LAB — RUPTURE OF MEMBRANE (ROM)PLUS: Rom Plus: NEGATIVE

## 2021-01-16 LAB — RESP PANEL BY RT-PCR (FLU A&B, COVID) ARPGX2
Influenza A by PCR: NEGATIVE
Influenza B by PCR: NEGATIVE
SARS Coronavirus 2 by RT PCR: NEGATIVE

## 2021-01-16 MED ORDER — OXYTOCIN BOLUS FROM INFUSION: 333.0000 mL | Freq: Once | INTRAVENOUS | Status: DC

## 2021-01-16 MED ORDER — LIDOCAINE HCL (PF) 1 % IJ SOLN: 30.0000 mL | INTRAMUSCULAR | Status: DC | PRN

## 2021-01-16 MED ORDER — LACTATED RINGERS IV SOLN: 500.0000 mL | INTRAVENOUS | Status: DC | PRN

## 2021-01-16 MED ORDER — ONDANSETRON HCL 4 MG/2ML IJ SOLN: 4.0000 mg | Freq: Four times a day (QID) | INTRAMUSCULAR | Status: DC | PRN

## 2021-01-16 MED ORDER — BUTORPHANOL TARTRATE 1 MG/ML IJ SOLN
1.0000 mg | Freq: Once | INTRAMUSCULAR | Status: AC
  Administered 2021-01-16: 1 mg via INTRAVENOUS
  Filled 2021-01-16: qty 1

## 2021-01-16 MED ORDER — LACTATED RINGERS IV SOLN: INTRAVENOUS | Status: DC

## 2021-01-16 MED ORDER — OXYCODONE-ACETAMINOPHEN 5-325 MG PO TABS: 1.0000 | ORAL_TABLET | ORAL | Status: DC | PRN

## 2021-01-16 MED ORDER — LIDOCAINE HCL (PF) 1 % IJ SOLN
INTRAMUSCULAR | Status: AC
  Filled 2021-01-16: qty 30

## 2021-01-16 MED ORDER — SOD CITRATE-CITRIC ACID 500-334 MG/5ML PO SOLN: 30.0000 mL | ORAL | Status: DC | PRN

## 2021-01-16 MED ORDER — ACETAMINOPHEN 325 MG PO TABS: 650.0000 mg | ORAL_TABLET | ORAL | Status: DC | PRN

## 2021-01-16 MED ORDER — SODIUM CHLORIDE 0.9 % IV SOLN: 1.0000 g | INTRAVENOUS | Status: DC

## 2021-01-16 MED ORDER — OXYTOCIN-SODIUM CHLORIDE 30-0.9 UT/500ML-% IV SOLN: 2.5000 [IU]/h | INTRAVENOUS | Status: DC

## 2021-01-16 MED ORDER — MISOPROSTOL 200 MCG PO TABS
ORAL_TABLET | ORAL | Status: AC
  Filled 2021-01-16: qty 4

## 2021-01-16 MED ORDER — AMPICILLIN SODIUM 2 G IJ SOLR
2.0000 g | Freq: Once | INTRAMUSCULAR | Status: DC
  Filled 2021-01-16: qty 2000

## 2021-01-16 MED ORDER — AMMONIA AROMATIC IN INHA
RESPIRATORY_TRACT | Status: AC
  Filled 2021-01-16: qty 10

## 2021-01-16 MED ORDER — OXYCODONE-ACETAMINOPHEN 5-325 MG PO TABS: 2.0000 | ORAL_TABLET | ORAL | Status: DC | PRN

## 2021-01-16 MED ORDER — OXYTOCIN 10 UNIT/ML IJ SOLN
INTRAMUSCULAR | Status: AC
  Filled 2021-01-16: qty 2

## 2021-01-16 NOTE — Discharge Summary (Signed)
Patient ID: Andrea Weiss MRN: [id number] DOB/AGE: Dec 31, 1995 24 y.o.  Admit date: 01/16/2021 Discharge date: 01/16/2021  Admission Diagnoses: Pt was assessed by nurse and thought to be 7cm and expecting imminent delivery. She was admited then checked by CNM, SVE was 1-2/80/-2.  Pt reported UC often and strong, denied VB but was unsure if she had any LOF.  Discharge Diagnoses: Early vs prodromal labor  Prenatal Procedures: NST  Consults: None  Significant Diagnostic Studies:  Results for orders placed or performed during the hospital encounter of 01/16/21 (from the past 168 hour(s))  Resp Panel by RT-PCR (Flu A&B, Covid) Nasopharyngeal Swab   Collection Time: 01/16/21  7:52 PM   Specimen: Nasopharyngeal Swab; Nasopharyngeal(NP) swabs in vial transport medium  Result Value Ref Range   SARS Coronavirus 2 by RT PCR NEGATIVE NEGATIVE   Influenza A by PCR NEGATIVE NEGATIVE   Influenza B by PCR NEGATIVE NEGATIVE  CBC   Collection Time: 01/16/21  7:52 PM  Result Value Ref Range   WBC 7.8 4.0 - 10.5 K/uL   RBC 3.81 (L) 3.87 - 5.11 MIL/uL   Hemoglobin 10.7 (L) 12.0 - 15.0 g/dL   HCT 15.4 (L) 00.8 - 67.6 %   MCV 85.6 80.0 - 100.0 fL   MCH 28.1 26.0 - 34.0 pg   MCHC 32.8 30.0 - 36.0 g/dL   RDW 19.5 09.3 - 26.7 %   Platelets 183 150 - 400 K/uL   nRBC 0.0 0.0 - 0.2 %  ROM Plus ([facility] only)   Collection Time: 01/16/21  7:52 PM  Result Value Ref Range   Rom Plus NEGATIVE   Type and screen [facility]   Collection Time: 01/16/21  7:52 PM  Result Value Ref Range   ABO/RH(D) O POS    Antibody Screen NEG    Sample Expiration      01/19/2021,2359 Performed at [facility], [address] [place], [place] [address]     Treatments: IV hydration  Hospital Course:  This is a 25 y.o. G3P1011 with IUP at [redacted]w[redacted]d admitted for active labor.  It was found that she was not in active labor.     Lab results above NST: FHR baseline: 140 bpm Variability:  moderate Accelerations: yes Decelerations: none Category/reactivity: reactive  She was observed, fetal heart rate monitoring remained reassuring, and she had no signs/symptoms of progressing preterm labor or other maternal-fetal concerns.  Her cervical exam was unchanged from admission.  She was deemed stable for discharge to home with outpatient follow up.  Discharge Physical Exam:  BP 116/80 (BP Location: Left Arm)   Pulse 85   Temp 98.3 F (36.8 C)   Resp 16   Ht 5\' 4"  (1.626 m)   Wt 61.7 kg   LMP 05/04/2020 Comment: normal  BMI 23.34 kg/m   General: NAD CV: RRR Pulm: CTABL, nl effort ABD: s/nd/nt, gravid DVT Evaluation: LE non-ttp, no evidence of DVT on exam.  TOCO: irregular 2-5 min SVE: deferred  Dilation: 1.5 Effacement (%): 80 Cervical Position: Posterior Station: -2 Presentation: Vertex Exam by:: [email or web address], CNM   Discharge Condition: Stable  Disposition: Discharge disposition: 01-Home or Self Care        Allergies as of 01/16/2021      Reactions   Mango Flavor Hives, Swelling   Allergy to mangos      Medication List    TAKE these medications   acetaminophen 500 MG tablet Commonly known as: TYLENOL Take 1,000 mg by mouth  every 6 (six) hours as needed for mild pain or headache.   cetirizine 10 MG tablet Commonly known as: ZYRTEC Take 1 tablet (10 mg total) by mouth daily.   multivitamin-prenatal 27-0.8 MG Tabs tablet Take 2 tablets by mouth daily at 12 noon. 2 gummies per day   sertraline 25 MG tablet Commonly known as: ZOLOFT Take 25 mg by mouth daily.       Follow-up Information    [facility]. Go to.   Why: to your next already scheduled appointment (if you make it) Contact information: [address] [place] [address] [phone]              Signed:  Quillian Quince 01/16/2021 11:05 PM

## 2021-01-16 NOTE — OB Triage Note (Signed)
Pt Andrea Weiss 25 y.o. presents to the ED complaining of contractions. Pt is a G3P1011 at [redacted]w[redacted]d . Pt denies signs and symptons consistent with rupture of membranes or active vaginal bleeding. Pt states positive fetal movement. External FM and TOCO applied to non-tender abdomen and assessing. Initial FHR 140. Vital signs obtained and within normal limits.Provider notified of pt.

## 2021-01-16 NOTE — Discharge Instructions (Signed)
First Stage of Labor Labor is your body's natural process of moving your baby and other structures, including the placenta and umbilical cord, out of your uterus. There are three stages of labor. How long each stage lasts is different for every woman. But certain events happen during each stage that are the same for everyone.  The first stage starts when true labor begins. This stage ends when your cervix, which is the opening from your uterus into your vagina, is completely open (dilated).  The second stage begins when your cervix is fully dilated and you start pushing. This stage ends when your baby is born.  The third stage is the delivery of the organ that nourished your baby during pregnancy (placenta). First stage of labor As your due date gets closer, you may start to notice certain physical changes that mean labor is going to start soon. You may feel that your baby has dropped lower into your pelvis. You may experience irregular, often painless, contractions that go away when you walk around or lie down (Braxton Hicks contractions). This is also called false labor. The first stage of labor begins when you start having contractions that come at regular (evenly spaced) intervals and your cervix starts to get thinner and wider in preparation for your baby to pass through. Birth care providers measure the dilation of your cervix in centimeters (cm). One centimeter is a little less than one-half of an inch. The first stage ends when your cervix is dilated to 10 cm. The first stage of labor is divided into three phases:  Early phase.  Active phase.  Transitional phase. The length of the first stage of labor varies. It may be longer if this is your first pregnancy. You may spend most of this stage at home trying to relax and stay comfortable. How does this affect me? During the first stage of labor, you will move through three phases. What happens in the early phase?  You will start to have  regular contractions that last 30-60 seconds. Contractions may come every 5-20 minutes. Keep track of your contractions and call your birth care provider.  Your water may break during this phase.  You may notice a clear or slightly bloody discharge of mucus (mucus plug) from your vagina.  Your cervix will dilate to 3-6 cm. What happens in the active phase? The active phase usually lasts 3-5 hours. You may go to the hospital or birth center around this time. During the active phase:  Your contractions will become stronger, longer, and more uncomfortable.  Your contractions may last 45-90 seconds and come every 3-5 minutes.  You may feel lower back pain.  Your birth care providers may examine your cervix and feel your belly to find the position of your baby.  You may have a monitor strapped to your belly to measure your contractions and your baby's heart rate.  You may start using your pain management options.  Your cervix may be dilated to 6 cm and may start to dilate more quickly. What happens in the transitional phase? The transitional phase typically lasts from 30 minutes to 2 hours. At the end of this phase, your cervix will be fully dilated to 10 cm. During the transitional phase:  Contractions will get stronger and longer.  Contractions may last 60-90 seconds and come less than 2 minutes apart.  You may feel hot flashes, chills, or nausea. How does this affect my baby? During the first stage of labor, your baby will   gradually move down into your birth canal. Follow these instructions at home and in the hospital or birth center:  When labor first begins, try to stay calm. You are still in the early phase. If it is night, try to get some sleep. If it is day, try to relax and save your energy. You may want to make some calls and get ready to go to the hospital or birth center.  When you are in the early phase, try these methods to help ease discomfort: ? Deep breathing and  muscle relaxation. ? Taking a walk. ? Taking a warm bath or shower.  Drink some fluids and have a light snack if you feel like it.  Keep track of your contractions.  Based on the plan you created with your birth care provider, call when your contractions indicate it is time.  If your water breaks, note the time, color, and odor of the fluid.  When you are in the active phase, do your breathing exercises and rely on your support people and your team of birth care providers.   Contact a health care provider if:  Your contractions are strong and regular.  You have lower back pain or cramping.  Your water breaks.  You lose your mucus plug. Get help right away if you:  Have a severe headache that does not go away.  Have changes in your vision.  Have severe pain in your upper belly.  Do not feel the baby move.  Have bright red bleeding. Summary  The first stage of labor starts when true labor begins, and it ends when your cervix is dilated to 10 cm.  The first stage of labor has three phases: early, active, and transitional.  Your baby moves into the birth canal during the first stage of labor.  You may have contractions that become stronger and longer. You may also lose your mucus plug and have your water break.  Call your birth care provider when your contractions are frequent and strong enough to go to the hospital or birth center. This information is not intended to replace advice given to you by your health care provider. Make sure you discuss any questions you have with your health care provider. Document Revised: 02/28/2019 Document Reviewed: 01/21/2018 Elsevier Patient Education  2021 [employer] Inc[employer]  

## 2021-01-17 LAB — RPR: RPR Ser Ql: NONREACTIVE

## 2021-02-04 ENCOUNTER — Other Ambulatory Visit
Admission: RE | Admit: 2021-02-04 | Discharge: 2021-02-04 | Disposition: A | Discharge status: Home / Self Care | Payer: Medicaid Other | Source: Ambulatory Visit | Attending: Obstetrics & Gynecology | Admitting: Obstetrics & Gynecology

## 2021-02-04 ENCOUNTER — Other Ambulatory Visit: Payer: Self-pay

## 2021-02-04 ENCOUNTER — Observation Stay
Admission: EM | Admit: 2021-02-04 | Discharge: 2021-02-04 | Disposition: A | Discharge status: Home / Self Care | Payer: Medicaid Other | Attending: Obstetrics and Gynecology | Admitting: Obstetrics and Gynecology

## 2021-02-04 ENCOUNTER — Encounter: Payer: Self-pay | Admitting: Obstetrics and Gynecology

## 2021-02-04 DIAGNOSIS — Z20822 Contact with and (suspected) exposure to covid-19: Secondary | ICD-10-CM | POA: Diagnosis not present

## 2021-02-04 DIAGNOSIS — O4193X0 Disorder of amniotic fluid and membranes, unspecified, third trimester, not applicable or unspecified: Principal | ICD-10-CM | POA: Insufficient documentation

## 2021-02-04 DIAGNOSIS — Z01812 Encounter for preprocedural laboratory examination: Secondary | ICD-10-CM | POA: Insufficient documentation

## 2021-02-04 DIAGNOSIS — Z87891 Personal history of nicotine dependence: Secondary | ICD-10-CM | POA: Insufficient documentation

## 2021-02-04 DIAGNOSIS — Z3A39 39 weeks gestation of pregnancy: Secondary | ICD-10-CM | POA: Insufficient documentation

## 2021-02-04 DIAGNOSIS — O479 False labor, unspecified: Secondary | ICD-10-CM | POA: Diagnosis present

## 2021-02-04 LAB — URINALYSIS, ROUTINE W REFLEX MICROSCOPIC
Bilirubin Urine: NEGATIVE
Glucose, UA: NEGATIVE mg/dL
Hgb urine dipstick: NEGATIVE
Ketones, ur: NEGATIVE mg/dL
Nitrite: NEGATIVE
Protein, ur: NEGATIVE mg/dL
Specific Gravity, Urine: 1.019 (ref 1.005–1.030)
pH: 7 (ref 5.0–8.0)

## 2021-02-04 LAB — WET PREP, GENITAL
Sperm: NONE SEEN
Trich, Wet Prep: NONE SEEN
Yeast Wet Prep HPF POC: NONE SEEN

## 2021-02-04 LAB — SARS CORONAVIRUS 2 (TAT 6-24 HRS): SARS Coronavirus 2: NEGATIVE

## 2021-02-04 LAB — RUPTURE OF MEMBRANE (ROM)PLUS: Rom Plus: NEGATIVE

## 2021-02-04 NOTE — Progress Notes (Signed)
CNM notified of negative fern test, Negative ROM+ and UA results. Pt is ctx every 2.5-8 minutes and they palpate mostly mild with an occasional moderate intensity. Ctx have been an ongoing complaint this pregnancy and pt has made no cervical change since her last check in Feb. Pt is scheduled for IOL this coming Saturday. CNM in department to evaluate.

## 2021-02-04 NOTE — Discharge Summary (Signed)
Patient ID: Andrea Weiss MRN: [id number] DOB/AGE: 01-29-1996 25 y.o.  Admit date: 02/04/2021 Discharge date: 02/04/2021  Admission Diagnoses: Gush of fluid and ongoing contractions 1. Factors complicating this pregnancy  2. Low platelets  3. Subchorionic Hemorrhage 4. Previous Smoker 5. H/o mental health diagnoses 6. Elevated A1c at NOB 7. Anemia  Seen in L&D on 2/7,2/18 and 2/26 for preterm labor  Discharge Diagnoses: Early vs prodromal labor  Prenatal Procedures: NST  Consults: None  Significant Diagnostic Studies:  Results for orders placed or performed during the hospital encounter of 02/04/21 (from the past 168 hour(s))  Wet prep, genital   Collection Time: 02/04/21  5:02 PM   Specimen: Vaginal  Result Value Ref Range   Yeast Wet Prep HPF POC NONE SEEN NONE SEEN   Trich, Wet Prep NONE SEEN NONE SEEN   Clue Cells Wet Prep HPF POC PRESENT (A) NONE SEEN   WBC, Wet Prep HPF POC MODERATE (A) NONE SEEN   Sperm NONE SEEN   ROM Plus ([facility] only)   Collection Time: 02/04/21  5:02 PM  Result Value Ref Range   Rom Plus NEGATIVE   Urinalysis, Routine w reflex microscopic   Collection Time: 02/04/21  5:02 PM  Result Value Ref Range   Color, Urine YELLOW (A) YELLOW   APPearance CLEAR (A) CLEAR   Specific Gravity, Urine 1.019 1.005 - 1.030   pH 7.0 5.0 - 8.0   Glucose, UA NEGATIVE NEGATIVE mg/dL   Hgb urine dipstick NEGATIVE NEGATIVE   Bilirubin Urine NEGATIVE NEGATIVE   Ketones, ur NEGATIVE NEGATIVE mg/dL   Protein, ur NEGATIVE NEGATIVE mg/dL   Nitrite NEGATIVE NEGATIVE   Leukocytes,Ua MODERATE (A) NEGATIVE   RBC / HPF 0-5 0 - 5 RBC/hpf   WBC, UA 0-5 0 - 5 WBC/hpf   Bacteria, UA RARE (A) NONE SEEN   Squamous Epithelial / LPF 0-5 0 - 5   Mucus PRESENT     Treatments: none  Hospital Course:  This is a 25 y.o. G3P1011 with IUP at [redacted]w[redacted]d seen for gush of fluids Rom+ and fern neg Wet prep - pos for BV  She was noted to have a cervical exam of 1.5/80/-2, post, vtx.   No leaking of fluid and no bleeding.  She was observed, fetal heart rate monitoring remained reassuring, and she had no signs/symptoms of progressing labor or other maternal-fetal concerns.  Her cervical exam was unchanged from admission.  She was deemed stable for discharge to home with outpatient follow up.  Discharge Physical Exam:  BP 118/80 (BP Location: Right Arm)   Pulse 83   Temp 98.2 F (36.8 C) (Oral)   Resp 16   LMP 05/04/2020 Comment: normal  General: NAD CV: RRR Pulm: CTABL, nl effort ABD: s/nd/nt, gravid DVT Evaluation: LE non-ttp, no evidence of DVT on exam.  NST: FHR baseline: 135 bpm Variability: moderate Accelerations: yes Decelerations: none Category/reactivity: reactive  TOCO: q3-58min SVE:  Dilation: 1.5 Effacement (%): 80 Cervical Position: Posterior Station: -2 Presentation: Vertex Exam by:: Lashunta Frieden CNM   Discharge Condition: Stable  Disposition: Discharge disposition: 01-Home or Self Care        Allergies as of 02/04/2021      Reactions   Mango Flavor Hives, Swelling   Allergy to mangos      Medication List    TAKE these medications   acetaminophen 500 MG tablet Commonly known as: TYLENOL Take 1,000 mg by mouth every 6 (six) hours as needed for mild pain or headache.  cetirizine 10 MG tablet Commonly known as: ZYRTEC Take 1 tablet (10 mg total) by mouth daily.   multivitamin-prenatal 27-0.8 MG Tabs tablet Take 2 tablets by mouth daily at 12 noon. 2 gummies per day   sertraline 25 MG tablet Commonly known as: ZOLOFT Take 25 mg by mouth daily.       Follow-up Information    [facility] Follow up.   Why: IOL planned for 02/06/21 Contact information: [address] [place] [address] [phone]              Signed:  Quillian Quince 02/04/2021 6:50 PM

## 2021-02-04 NOTE — Discharge Instructions (Signed)
First Stage of Labor Labor is your body's natural process of moving your baby and other structures, including the placenta and umbilical cord, out of your uterus. There are three stages of labor. How long each stage lasts is different for every woman. But certain events happen during each stage that are the same for everyone.  The first stage starts when true labor begins. This stage ends when your cervix, which is the opening from your uterus into your vagina, is completely open (dilated).  The second stage begins when your cervix is fully dilated and you start pushing. This stage ends when your baby is born.  The third stage is the delivery of the organ that nourished your baby during pregnancy (placenta). First stage of labor As your due date gets closer, you may start to notice certain physical changes that mean labor is going to start soon. You may feel that your baby has dropped lower into your pelvis. You may experience irregular, often painless, contractions that go away when you walk around or lie down (Braxton Hicks contractions). This is also called false labor. The first stage of labor begins when you start having contractions that come at regular (evenly spaced) intervals and your cervix starts to get thinner and wider in preparation for your baby to pass through. Birth care providers measure the dilation of your cervix in centimeters (cm). One centimeter is a little less than one-half of an inch. The first stage ends when your cervix is dilated to 10 cm. The first stage of labor is divided into three phases:  Early phase.  Active phase.  Transitional phase. The length of the first stage of labor varies. It may be longer if this is your first pregnancy. You may spend most of this stage at home trying to relax and stay comfortable. How does this affect me? During the first stage of labor, you will move through three phases. What happens in the early phase?  You will start to have  regular contractions that last 30-60 seconds. Contractions may come every 5-20 minutes. Keep track of your contractions and call your birth care provider.  Your water may break during this phase.  You may notice a clear or slightly bloody discharge of mucus (mucus plug) from your vagina.  Your cervix will dilate to 3-6 cm. What happens in the active phase? The active phase usually lasts 3-5 hours. You may go to the hospital or birth center around this time. During the active phase:  Your contractions will become stronger, longer, and more uncomfortable.  Your contractions may last 45-90 seconds and come every 3-5 minutes.  You may feel lower back pain.  Your birth care providers may examine your cervix and feel your belly to find the position of your baby.  You may have a monitor strapped to your belly to measure your contractions and your baby's heart rate.  You may start using your pain management options.  Your cervix may be dilated to 6 cm and may start to dilate more quickly. What happens in the transitional phase? The transitional phase typically lasts from 30 minutes to 2 hours. At the end of this phase, your cervix will be fully dilated to 10 cm. During the transitional phase:  Contractions will get stronger and longer.  Contractions may last 60-90 seconds and come less than 2 minutes apart.  You may feel hot flashes, chills, or nausea. How does this affect my baby? During the first stage of labor, your baby will   gradually move down into your birth canal. Follow these instructions at home and in the hospital or birth center:  When labor first begins, try to stay calm. You are still in the early phase. If it is night, try to get some sleep. If it is day, try to relax and save your energy. You may want to make some calls and get ready to go to the hospital or birth center.  When you are in the early phase, try these methods to help ease discomfort: ? Deep breathing and  muscle relaxation. ? Taking a walk. ? Taking a warm bath or shower.  Drink some fluids and have a light snack if you feel like it.  Keep track of your contractions.  Based on the plan you created with your birth care provider, call when your contractions indicate it is time.  If your water breaks, note the time, color, and odor of the fluid.  When you are in the active phase, do your breathing exercises and rely on your support people and your team of birth care providers.   Contact a health care provider if:  Your contractions are strong and regular.  You have lower back pain or cramping.  Your water breaks.  You lose your mucus plug. Get help right away if you:  Have a severe headache that does not go away.  Have changes in your vision.  Have severe pain in your upper belly.  Do not feel the baby move.  Have bright red bleeding. Summary  The first stage of labor starts when true labor begins, and it ends when your cervix is dilated to 10 cm.  The first stage of labor has three phases: early, active, and transitional.  Your baby moves into the birth canal during the first stage of labor.  You may have contractions that become stronger and longer. You may also lose your mucus plug and have your water break.  Call your birth care provider when your contractions are frequent and strong enough to go to the hospital or birth center. This information is not intended to replace advice given to you by your health care provider. Make sure you discuss any questions you have with your health care provider. Document Revised: 02/28/2019 Document Reviewed: 01/21/2018 Elsevier Patient Education  2021 [place] induction is when steps are taken to cause a pregnant woman to begin the labor process. Most women go into labor on their own between 37 weeks and 42 weeks of pregnancy. When this does not happen, or when there is a medical need for labor to  begin, steps may be taken to induce, or bring on, labor. Labor induction causes a pregnant woman's uterus to contract. It also causes the cervix to soften (ripen), open (dilate), and thin out. Usually, labor is not induced before 39 weeks of pregnancy unless there is a medical reason to do so. When is labor induction considered? Labor induction may be right for you if:  Your pregnancy lasts longer than 41 to 42 weeks.  Your placenta is separating from your uterus (placental abruption).  You have a rupture of membranes and your labor does not begin.  You have health problems, like diabetes or high blood pressure (preeclampsia) during your pregnancy.  Your baby has stopped growing or does not have enough amniotic fluid. Before labor induction begins, your health care provider will consider the following factors:  Your medical condition and the baby's condition.  How many weeks  you have been pregnant.  How mature the baby's lungs are.  The condition of your cervix.  The position of the baby.  The size of your birth canal. Tell a health care provider about:  Any allergies you have.  All medicines you are taking, including vitamins, herbs, eye drops, creams, and over-the-counter medicines.  Any problems you or your family members have had with anesthetic medicines.  Any surgeries you have had.  Any blood disorders you have.  Any medical conditions you have. What are the risks? Generally, this is a safe procedure. However, problems may occur, including:  Failed induction.  Changes in fetal heart rate, such as being too high, too low, or irregular (erratic).  Infection in the mother or the baby.  Increased risk of having a cesarean delivery.  Breaking off (abruption) of the placenta from the uterus. This is rare.  Rupture of the uterus. This is very rare.  Your baby could fail to get enough blood flow or oxygen. This can be life-threatening. When induction is needed  for medical reasons, the benefits generally outweigh the risks. What happens during the procedure? During the procedure, your health care provider will use one of these methods to induce labor:  Stripping the membranes. In this method, the amniotic sac tissue is gently separated from the cervix. This causes the following to happen: ? Your cervix stretches, which in turn causes the release of prostaglandins. ? Prostaglandins induce labor and cause the uterus to contract. ? This procedure is often done in an office visit. You will be sent home to wait for contractions to begin.  Prostaglandin medicine. This medicine starts contractions and causes the cervix to dilate and ripen. This can be taken by mouth (orally) or by being inserted into the vagina (suppository).  Inserting a small, thin tube (catheter) with a balloon into the vagina and then expanding the balloon with water to dilate the cervix.  Breaking the water. In this method, a small instrument is used to make a small hole in the amniotic sac. This eventually causes the amniotic sac to break. Contractions should begin within a few hours.  Medicine to trigger or strengthen contractions. This medicine is given through an IV that is inserted into a vein in your arm. This procedure may vary among health care providers and hospitals.   Where to find more information  [employer]: www.marchofdimes.org  [employer]nec[employer]: www.acog.org Summary  Labor induction causes a pregnant woman's uterus to contract. It also causes the cervix to soften (ripen), open (dilate), and thin out.  Labor is usually not induced before 39 weeks of pregnancy unless there is a medical reason to do so.  When induction is needed for medical reasons, the benefits generally outweigh the risks.  Talk with your health care provider about which methods of labor induction are right for you. This information is not intended to  replace advice given to you by your health care provider. Make sure you discuss any questions you have with your health care provider. Document Revised: 08/20/2020 Document Reviewed: 08/20/2020 Elsevier Patient Education  2021 [employer].

## 2021-02-04 NOTE — OB Triage Note (Signed)
Patient Discharged home per provider. Pt educated about labor precautions and informed when to return to the ED for further evaluation. Pt reminded to return 02/06/21 for induction. AVS given to patient and RN answered all questions and patient has no further questions at this time. Pt discharged home in stable condition with significant other.

## 2021-02-05 ENCOUNTER — Other Ambulatory Visit: Payer: Self-pay

## 2021-02-05 ENCOUNTER — Encounter: Payer: Self-pay | Admitting: Obstetrics and Gynecology

## 2021-02-05 DIAGNOSIS — Z3490 Encounter for supervision of normal pregnancy, unspecified, unspecified trimester: Secondary | ICD-10-CM

## 2021-02-05 DIAGNOSIS — F1290 Cannabis use, unspecified, uncomplicated: Secondary | ICD-10-CM | POA: Insufficient documentation

## 2021-02-05 HISTORY — DX: Cannabis use, unspecified, uncomplicated: F12.90

## 2021-02-05 NOTE — Progress Notes (Signed)
[id number] with at [redacted]w[redacted]d, LMP of 04/27/2020, c/w early [place] at [redacted]w[redacted]d.  Scheduled for induction of labor for elective at term on 02/06/2021.   Prenatal provider: [facility] Pregnancy complicated by: contractions 1. Factors complicating this pregnancy  2. Low platelets  3. Subchorionic Hemorrhage 4. Previous Smoker 5. H/o mental health diagnoses 6. Elevated A1c at NOB 7. Anemia 8. Rubella non-immune 9. Marijuana use in pregnancy   Prenatal Labs: Blood type/Rh O pos  Antibody screen neg  Rubella Non-Immune  Varicella Immune  RPR NR  HBsAg Neg  HIV NR  GC neg  Chlamydia neg  Genetic screening cfDNA negative  1 hour GTT 94  3 hour GTT N/A   GBS Neg   Tdap: given 12/23/2020 Flu: Declined  Contraception: TBD Feeding preference: TBD  ____ Margaretmary Eddy, CNM Certified Nurse Midwife [facility]

## 2021-02-06 ENCOUNTER — Inpatient Hospital Stay
Admission: RE | Admit: 2021-02-06 | Discharge: 2021-02-08 | DRG: 806 | Disposition: A | Discharge status: Home / Self Care | Payer: Medicaid Other

## 2021-02-06 ENCOUNTER — Inpatient Hospital Stay: Payer: Medicaid Other | Admitting: Anesthesiology

## 2021-02-06 ENCOUNTER — Encounter: Payer: Self-pay | Admitting: Obstetrics and Gynecology

## 2021-02-06 ENCOUNTER — Other Ambulatory Visit: Payer: Self-pay

## 2021-02-06 DIAGNOSIS — F1290 Cannabis use, unspecified, uncomplicated: Secondary | ICD-10-CM | POA: Diagnosis present

## 2021-02-06 DIAGNOSIS — O99324 Drug use complicating childbirth: Secondary | ICD-10-CM | POA: Diagnosis present

## 2021-02-06 DIAGNOSIS — D509 Iron deficiency anemia, unspecified: Secondary | ICD-10-CM | POA: Diagnosis present

## 2021-02-06 DIAGNOSIS — Z87891 Personal history of nicotine dependence: Secondary | ICD-10-CM | POA: Diagnosis not present

## 2021-02-06 DIAGNOSIS — Z3A39 39 weeks gestation of pregnancy: Secondary | ICD-10-CM

## 2021-02-06 DIAGNOSIS — Z3490 Encounter for supervision of normal pregnancy, unspecified, unspecified trimester: Secondary | ICD-10-CM

## 2021-02-06 DIAGNOSIS — O26893 Other specified pregnancy related conditions, third trimester: Secondary | ICD-10-CM | POA: Diagnosis present

## 2021-02-06 DIAGNOSIS — O9902 Anemia complicating childbirth: Secondary | ICD-10-CM | POA: Diagnosis present

## 2021-02-06 HISTORY — DX: Anemia, unspecified: D64.9

## 2021-02-06 HISTORY — DX: Depression, unspecified: F32.A

## 2021-02-06 HISTORY — DX: Encounter for supervision of normal pregnancy, unspecified, unspecified trimester: Z34.90

## 2021-02-06 LAB — URINE DRUG SCREEN, QUALITATIVE (ARMC ONLY)
Amphetamines, Ur Screen: NOT DETECTED
Barbiturates, Ur Screen: NOT DETECTED
Benzodiazepine, Ur Scrn: NOT DETECTED
Cannabinoid 50 Ng, Ur ~~LOC~~: NOT DETECTED
Cocaine Metabolite,Ur ~~LOC~~: NOT DETECTED
MDMA (Ecstasy)Ur Screen: NOT DETECTED
Methadone Scn, Ur: NOT DETECTED
Opiate, Ur Screen: NOT DETECTED
Phencyclidine (PCP) Ur S: NOT DETECTED
Tricyclic, Ur Screen: NOT DETECTED

## 2021-02-06 LAB — CBC
HCT: 34.4 % — ABNORMAL LOW (ref 36.0–46.0)
Hemoglobin: 10.9 g/dL — ABNORMAL LOW (ref 12.0–15.0)
MCH: 26.1 pg (ref 26.0–34.0)
MCHC: 31.7 g/dL (ref 30.0–36.0)
MCV: 82.5 fL (ref 80.0–100.0)
Platelets: 171 10*3/uL (ref 150–400)
RBC: 4.17 MIL/uL (ref 3.87–5.11)
RDW: 14.6 % (ref 11.5–15.5)
WBC: 6.1 10*3/uL (ref 4.0–10.5)
nRBC: 0 % (ref 0.0–0.2)

## 2021-02-06 LAB — TYPE AND SCREEN
ABO/RH(D): O POS
Antibody Screen: NEGATIVE

## 2021-02-06 MED ORDER — LIDOCAINE-EPINEPHRINE (PF) 1.5 %-1:200000 IJ SOLN
INTRAMUSCULAR | Status: DC | PRN
  Administered 2021-02-06: 3 mL via EPIDURAL

## 2021-02-06 MED ORDER — ACETAMINOPHEN 500 MG PO TABS: 1000.0000 mg | ORAL_TABLET | Freq: Four times a day (QID) | ORAL | Status: DC | PRN

## 2021-02-06 MED ORDER — LIDOCAINE HCL (PF) 1 % IJ SOLN
30.0000 mL | INTRAMUSCULAR | Status: DC | PRN
Start: 2021-02-06 — End: 2021-02-06
  Filled 2021-02-06: qty 30

## 2021-02-06 MED ORDER — PHENYLEPHRINE 40 MCG/ML (10ML) SYRINGE FOR IV PUSH (FOR BLOOD PRESSURE SUPPORT)
80.0000 ug | PREFILLED_SYRINGE | INTRAVENOUS | Status: DC | PRN
  Filled 2021-02-06: qty 10

## 2021-02-06 MED ORDER — TERBUTALINE SULFATE 1 MG/ML IJ SOLN: 0.2500 mg | Freq: Once | INTRAMUSCULAR | Status: DC | PRN

## 2021-02-06 MED ORDER — SERTRALINE HCL 25 MG PO TABS
25.0000 mg | ORAL_TABLET | Freq: Every day | ORAL | Status: DC
  Administered 2021-02-07 – 2021-02-08 (×2): 25 mg via ORAL
  Filled 2021-02-06 (×2): qty 1

## 2021-02-06 MED ORDER — LACTATED RINGERS IV SOLN
500.0000 mL | INTRAVENOUS | Status: DC | PRN
  Administered 2021-02-06: 500 mL via INTRAVENOUS

## 2021-02-06 MED ORDER — FENTANYL 2.5 MCG/ML W/ROPIVACAINE 0.15% IN NS 100 ML EPIDURAL (ARMC)
EPIDURAL | Status: AC
  Filled 2021-02-06: qty 100

## 2021-02-06 MED ORDER — AMMONIA AROMATIC IN INHA
RESPIRATORY_TRACT | Status: AC
  Filled 2021-02-06: qty 10

## 2021-02-06 MED ORDER — EPHEDRINE 5 MG/ML INJ
10.0000 mg | INTRAVENOUS | Status: DC | PRN
  Filled 2021-02-06: qty 2

## 2021-02-06 MED ORDER — OXYTOCIN-SODIUM CHLORIDE 30-0.9 UT/500ML-% IV SOLN
2.5000 [IU]/h | INTRAVENOUS | Status: DC
  Administered 2021-02-06: 2.5 [IU]/h via INTRAVENOUS
  Filled 2021-02-06: qty 500

## 2021-02-06 MED ORDER — SODIUM CHLORIDE 0.9 % IV SOLN
INTRAVENOUS | Status: DC | PRN
  Administered 2021-02-06: 8 mL via EPIDURAL

## 2021-02-06 MED ORDER — OXYTOCIN-SODIUM CHLORIDE 30-0.9 UT/500ML-% IV SOLN
1.0000 m[IU]/min | INTRAVENOUS | Status: DC
  Administered 2021-02-06: 1 m[IU]/min via INTRAVENOUS

## 2021-02-06 MED ORDER — BENZOCAINE-MENTHOL 20-0.5 % EX AERO
1.0000 "application " | INHALATION_SPRAY | CUTANEOUS | Status: DC | PRN
  Filled 2021-02-06: qty 56

## 2021-02-06 MED ORDER — CALCIUM CARBONATE ANTACID 500 MG PO CHEW: 400.0000 mg | CHEWABLE_TABLET | Freq: Three times a day (TID) | ORAL | Status: DC | PRN

## 2021-02-06 MED ORDER — IBUPROFEN 600 MG PO TABS
600.0000 mg | ORAL_TABLET | Freq: Four times a day (QID) | ORAL | Status: DC
  Administered 2021-02-07 – 2021-02-08 (×5): 600 mg via ORAL
  Filled 2021-02-06 (×5): qty 1

## 2021-02-06 MED ORDER — WITCH HAZEL-GLYCERIN EX PADS
1.0000 "application " | MEDICATED_PAD | CUTANEOUS | Status: DC
  Administered 2021-02-06: 1 via TOPICAL

## 2021-02-06 MED ORDER — MISOPROSTOL 200 MCG PO TABS
ORAL_TABLET | ORAL | Status: AC
  Filled 2021-02-06: qty 4

## 2021-02-06 MED ORDER — IBUPROFEN 600 MG PO TABS
600.0000 mg | ORAL_TABLET | Freq: Four times a day (QID) | ORAL | Status: DC
  Administered 2021-02-06: 600 mg via ORAL

## 2021-02-06 MED ORDER — LIDOCAINE HCL (PF) 1 % IJ SOLN
INTRAMUSCULAR | Status: DC | PRN
  Administered 2021-02-06: 3 mL via SUBCUTANEOUS

## 2021-02-06 MED ORDER — ONDANSETRON HCL 4 MG/2ML IJ SOLN: 4.0000 mg | INTRAMUSCULAR | Status: DC | PRN

## 2021-02-06 MED ORDER — ONDANSETRON HCL 4 MG PO TABS: 4.0000 mg | ORAL_TABLET | ORAL | Status: DC | PRN

## 2021-02-06 MED ORDER — FENTANYL 2.5 MCG/ML W/ROPIVACAINE 0.15% IN NS 100 ML EPIDURAL (ARMC): 12.0000 mL/h | EPIDURAL | Status: DC

## 2021-02-06 MED ORDER — DIPHENHYDRAMINE HCL 25 MG PO CAPS: 25.0000 mg | ORAL_CAPSULE | Freq: Four times a day (QID) | ORAL | Status: DC | PRN

## 2021-02-06 MED ORDER — FERROUS SULFATE 325 (65 FE) MG PO TABS
325.0000 mg | ORAL_TABLET | Freq: Two times a day (BID) | ORAL | Status: DC
  Administered 2021-02-07 – 2021-02-08 (×2): 325 mg via ORAL
  Filled 2021-02-06 (×2): qty 1

## 2021-02-06 MED ORDER — DIBUCAINE (PERIANAL) 1 % EX OINT: 1.0000 "application " | TOPICAL_OINTMENT | CUTANEOUS | Status: DC | PRN

## 2021-02-06 MED ORDER — ONDANSETRON HCL 4 MG/2ML IJ SOLN: 4.0000 mg | Freq: Four times a day (QID) | INTRAMUSCULAR | Status: DC | PRN

## 2021-02-06 MED ORDER — DIPHENHYDRAMINE HCL 50 MG/ML IJ SOLN: 12.5000 mg | INTRAMUSCULAR | Status: DC | PRN

## 2021-02-06 MED ORDER — FENTANYL 2.5 MCG/ML W/ROPIVACAINE 0.15% IN NS 100 ML EPIDURAL (ARMC)
EPIDURAL | Status: DC | PRN
  Administered 2021-02-06: 12 mL/h via EPIDURAL

## 2021-02-06 MED ORDER — SIMETHICONE 80 MG PO CHEW: 80.0000 mg | CHEWABLE_TABLET | ORAL | Status: DC | PRN

## 2021-02-06 MED ORDER — OXYTOCIN 10 UNIT/ML IJ SOLN
INTRAMUSCULAR | Status: AC
  Filled 2021-02-06: qty 2

## 2021-02-06 MED ORDER — BUTORPHANOL TARTRATE 1 MG/ML IJ SOLN: 1.0000 mg | INTRAMUSCULAR | Status: DC | PRN

## 2021-02-06 MED ORDER — LACTATED RINGERS IV SOLN: INTRAVENOUS | Status: DC

## 2021-02-06 MED ORDER — SOD CITRATE-CITRIC ACID 500-334 MG/5ML PO SOLN
30.0000 mL | ORAL | Status: DC | PRN
Start: 2021-02-06 — End: 2021-02-06

## 2021-02-06 MED ORDER — COCONUT OIL OIL
1.0000 "application " | TOPICAL_OIL | Status: DC | PRN
  Administered 2021-02-07: 1 via TOPICAL
  Filled 2021-02-06: qty 120

## 2021-02-06 MED ORDER — PRENATAL MULTIVITAMIN CH
1.0000 | ORAL_TABLET | Freq: Every day | ORAL | Status: DC
  Administered 2021-02-07: 1 via ORAL
  Filled 2021-02-06: qty 1

## 2021-02-06 MED ORDER — LACTATED RINGERS IV SOLN: 500.0000 mL | Freq: Once | INTRAVENOUS | Status: DC

## 2021-02-06 MED ORDER — DOCUSATE SODIUM 100 MG PO CAPS
100.0000 mg | ORAL_CAPSULE | Freq: Two times a day (BID) | ORAL | Status: DC
  Administered 2021-02-06 – 2021-02-08 (×4): 100 mg via ORAL
  Filled 2021-02-06 (×3): qty 1

## 2021-02-06 MED ORDER — OXYTOCIN BOLUS FROM INFUSION
333.0000 mL | Freq: Once | INTRAVENOUS | Status: AC
  Administered 2021-02-06: 333 mL via INTRAVENOUS

## 2021-02-06 NOTE — Anesthesia Procedure Notes (Signed)
Epidural Patient location during procedure: OB Start time: 02/06/2021 5:05 PM End time: 02/06/2021 5:29 PM  Staffing Anesthesiologist: Corinda Gubler, MD Performed: anesthesiologist   Preanesthetic Checklist Completed: patient identified, IV checked, site marked, risks and benefits discussed, surgical consent, monitors and equipment checked, pre-op evaluation and timeout performed  Epidural Patient position: sitting Prep: ChloraPrep Patient monitoring: heart rate, continuous pulse ox and blood pressure Approach: midline Location: L3-L4 Injection technique: LOR saline  Needle:  Needle type: Tuohy  Needle gauge: 17 G Needle length: 9 cm and 9 Needle insertion depth: 4 cm Catheter type: closed end flexible Catheter size: 19 Gauge Catheter at skin depth: 9 cm Test dose: negative and 1.5% lidocaine with Epi 1:200 K  Assessment Sensory level: T10 Events: blood not aspirated, injection not painful, no injection resistance, no paresthesia and negative IV test  Additional Notes first attempt Pt. Evaluated and documentation done after procedure finished. Patient identified. Risks/Benefits/Options discussed with patient including but not limited to bleeding, infection, nerve damage, paralysis, failed block, incomplete pain control, headache, blood pressure changes, nausea, vomiting, reactions to medication both or allergic, itching and postpartum back pain. Confirmed with bedside nurse the patient's most recent platelet count. Confirmed with patient that they are not currently taking any anticoagulation, have any bleeding history or any family history of bleeding disorders. Patient expressed understanding and wished to proceed. All questions were answered. Sterile technique was used throughout the entire procedure. Please see nursing notes for vital signs. Test dose was given through epidural catheter and negative prior to continuing to dose epidural or start infusion. Warning signs of high  block given to the patient including shortness of breath, tingling/numbness in hands, complete motor block, or any concerning symptoms with instructions to call for help. Patient was given instructions on fall risk and not to get out of bed. All questions and concerns addressed with instructions to call with any issues or inadequate analgesia.   Patient tolerated the insertion well without immediate complications.Reason for block:procedure for pain

## 2021-02-06 NOTE — Progress Notes (Signed)
Labor Progress Note  Andrea Weiss is a 25 y.o. G3P1011 at [redacted]w[redacted]d by LMP admitted for induction of labor due to Elective at term.  Subjective: feeling more comfortable after epidural  Objective: BP (!) 110/59   Pulse 68   Temp 98.5 F (36.9 C) (Oral)   Resp 14   Ht 5\' 4"  (1.626 m)   Wt 63.5 kg   LMP 05/04/2020 Comment: normal  SpO2 100%   BMI 24.03 kg/m  Notable VS details: reviewed   Fetal Assessment: FHT:  FHR: 125 bpm, variability: moderate,  accelerations:  Present,  decelerations:  Absent Category/reactivity:  Category I UC:   regular, every 2-3 minutes SVE:   4/90/+1/soft/mid  Membrane status: AROM at 1747 Amniotic color: Clear   Labs: Lab Results  Component Value Date   WBC 6.1 02/06/2021   HGB 10.9 (L) 02/06/2021   HCT 34.4 (L) 02/06/2021   MCV 82.5 02/06/2021   PLT 171 02/06/2021    Assessment / Plan: Induction of labor due to elective at term,  progressing well on pitocin  Labor: Progressing normally Preeclampsia:  no signs or symptoms of toxicity Fetal Wellbeing:  Category I Pain Control:  Epidural I/D:  n/a Anticipated MOD:  NSVD  02/08/2021, CNM 02/06/2021, 5:54 PM

## 2021-02-06 NOTE — Anesthesia Preprocedure Evaluation (Signed)
Anesthesia Evaluation  Patient identified by MRN, date of birth, ID band Patient awake    Reviewed: Allergy & Precautions, NPO status , Patient's Chart, lab work & pertinent test results  History of Anesthesia Complications Negative for: history of anesthetic complications  Airway Mallampati: III  TM Distance: >3 FB Neck ROM: Full    Dental no notable dental hx. (+) Teeth Intact   Pulmonary neg pulmonary ROS, neg sleep apnea, neg COPD, Patient abstained from smoking.Not current smoker, former smoker,    Pulmonary exam normal breath sounds clear to auscultation       Cardiovascular Exercise Tolerance: Good METS(-) hypertension(-) CAD and (-) Past MI negative cardio ROS  (-) dysrhythmias  Rhythm:Regular Rate:Normal - Systolic murmurs    Neuro/Psych PSYCHIATRIC DISORDERS Anxiety Depression negative neurological ROS     GI/Hepatic neg GERD  ,(+)     (-) substance abuse  ,   Endo/Other  neg diabetes  Renal/GU negative Renal ROS     Musculoskeletal   Abdominal   Peds  Hematology  (+) Blood dyscrasia, anemia ,   Anesthesia Other Findings Past Medical History: No date: Abdominal hernia No date: Anemia No date: Anxiety No date: Depression No date: History of anemia No date: History of pica No date: History of urinary tract infection No date: Medical history non-contributory  Reproductive/Obstetrics                             Anesthesia Physical Anesthesia Plan  ASA: II  Anesthesia Plan: Epidural   Post-op Pain Management:    Induction:   PONV Risk Score and Plan: 3 and Treatment may vary due to age or medical condition and Ondansetron  Airway Management Planned: Natural Airway  Additional Equipment:   Intra-op Plan:   Post-operative Plan:   Informed Consent: I have reviewed the patients History and Physical, chart, labs and discussed the procedure including the risks,  benefits and alternatives for the proposed anesthesia with the patient or authorized representative who has indicated his/her understanding and acceptance.       Plan Discussed with: Surgeon  Anesthesia Plan Comments: (Discussed R/B/A of neuraxial anesthesia technique with patient: - rare risks of spinal/epidural hematoma, nerve damage, infection - Risk of PDPH - Risk of itching - Risk of nausea and vomiting - Risk of poor block necessitating replacement of epidural. Patient voiced understanding.)        Anesthesia Quick Evaluation

## 2021-02-06 NOTE — H&P (Addendum)
OB History & Physical   History of Present Illness:   Chief Complaint: scheduled induction of labor   HPI:  Andrea Weiss is a 25 y.o. G90P1011 female at [redacted]w[redacted]d dated by LMP of 04/27/2020, c/w [place] at [redacted]w[redacted]d.  She presents to L&D for scheduled elective IOL at term  Reports active fetal movement  Contractions: irregular cramping  LOF/SROM: denies  Vaginal bleeding: denies   Pregnancy Issues: 1.Factors complicating this pregnancy 2.Low platelets 3.Subchorionic Hemorrhage 4.Previous Smoker 5.H/o mental health diagnoses 6.Elevated A1c at NOB 7.Anemia 8. Rubella non-immune 9. Marijuana use in pregnancy  Patient Active Problem List   Diagnosis Date Noted  . Encounter for elective induction of labor 02/06/2021  . Marijuana smoker 02/05/2021  . Supervision of normal pregnancy 07/21/2020  . Encounter for supervision of other normal pregnancy, third trimester 07/21/2020     Maternal Medical History:   Past Medical History:  Diagnosis Date  . Abdominal hernia   . Anemia   . Anxiety   . Depression   . History of anemia   . History of pica   . History of urinary tract infection   . Medical history non-contributory     Past Surgical History:  Procedure Laterality Date  . Nexplanon insertion    . WISDOM TOOTH EXTRACTION Bilateral     Allergies  Allergen Reactions  . 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 Authorizing Provider  acetaminophen (TYLENOL) 500 MG tablet Take 1,000 mg by mouth every 6 (six) hours as needed for mild pain or headache.   Yes [provider]  sertraline (ZOLOFT) 25 MG tablet Take 25 mg by mouth daily.   Yes [provider]  cetirizine (ZYRTEC) 10 MG tablet Take 1 tablet (10 mg total) by mouth daily. Patient not taking: No sig reported 12/27/17   Marylene Land, CNM  Prenatal Vit-Fe Fumarate-FA (MULTIVITAMIN-PRENATAL)  27-0.8 MG TABS tablet Take 2 tablets by mouth daily at 12 noon. 2 gummies per day Patient not taking: Reported on 02/06/2021    [provider]     Prenatal care site:  [facility]  Social History: She  reports that she quit smoking about 14 months ago. Her smoking use included cigarettes. She smoked 0.25 packs per day. She has never used smokeless tobacco. She reports previous alcohol use. She reports previous drug use. Drug: Marijuana.  Family History: family history is not on file.   Review of Systems: A full review of systems was performed and negative except as noted in the HPI.     Physical Exam:  Vital Signs: BP 122/74   Pulse 75   Temp 97.9 F (36.6 C) (Axillary)   Resp 16   Ht 5\' 4"  (1.626 m)   Wt 63.5 kg   LMP 05/04/2020 Comment: normal  BMI 24.03 kg/m  Physical Exam  General: no acute distress.  HEENT: normocephalic, atraumatic Heart: regular rate & rhythm.  No murmurs/rubs/gallops Lungs: clear to auscultation bilaterally, normal respiratory effort Abdomen: soft, gravid, non-tender;  EFW: 6 1/2 lbs  Pelvic:   External: Normal external female genitalia  Cervix: Dilation: 2 / Effacement (%): 80 / Station: -2    Extremities: non-tender, symmetric, no edema bilaterally.  DTRs: 2+/2+  Neurologic: Alert & oriented x 3.    Results for orders placed or performed during the hospital encounter of 02/06/21 (from the past 24 hour(s))  CBC  Status: Abnormal   Collection Time: 02/06/21 10:45 AM  Result Value Ref Range   WBC 6.1 4.0 - 10.5 K/uL   RBC 4.17 3.87 - 5.11 MIL/uL   Hemoglobin 10.9 (L) 12.0 - 15.0 g/dL   HCT 86.7 (L) 67.2 - 09.4 %   MCV 82.5 80.0 - 100.0 fL   MCH 26.1 26.0 - 34.0 pg   MCHC 31.7 30.0 - 36.0 g/dL   RDW 70.9 62.8 - 36.6 %   Platelets 171 150 - 400 K/uL   nRBC 0.0 0.0 - 0.2 %  Type and screen     Status: None   Collection Time: 02/06/21 10:45 AM  Result Value Ref Range   ABO/RH(D) O POS    Antibody Screen NEG     Sample Expiration      02/09/2021,2359 Performed at [facility], [address], [place], [place] [address]   Urine Drug Screen, Qualitative ([facility] only)     Status: None   Collection Time: 02/06/21 10:45 AM  Result Value Ref Range   Tricyclic, Ur Screen NONE DETECTED NONE DETECTED   Amphetamines, Ur Screen NONE DETECTED NONE DETECTED   MDMA (Ecstasy)Ur Screen NONE DETECTED NONE DETECTED   Cocaine Metabolite,Ur [place] NONE DETECTED NONE DETECTED   Opiate, Ur Screen NONE DETECTED NONE DETECTED   Phencyclidine (PCP) Ur S NONE DETECTED NONE DETECTED   Cannabinoid 50 Ng, Ur [place] NONE DETECTED NONE DETECTED   Barbiturates, Ur Screen NONE DETECTED NONE DETECTED   Benzodiazepine, Ur Scrn NONE DETECTED NONE DETECTED   Methadone Scn, Ur NONE DETECTED NONE DETECTED    Pertinent Results:  Prenatal Labs: Blood type/Rh O pos  Antibody screen neg  Rubella Immune  Varicella Immune  RPR NR  HBsAg Neg  HIV NR  GC neg  Chlamydia neg  Genetic screening cfDNA negative  1 hour GTT 94  3 hour GTT N/A   GBS Neg    FHT: Baseline: 140 bpm, Variability: moderate, Accelerations: present and Decelerations: Absent TOCO: Irregular, mild contractions  SVE:  Dilation: 2 / Effacement (%): 80 / Station: -2    Cephalic by [place] and SVE   No results found.  Assessment:  Andrea Weiss is a 25 y.o. G93P1011 female at [redacted]w[redacted]d with elective IOL at term.   Plan:  1. Admit to Labor & Delivery; consents reviewed and obtained - Covid admission screen   2. Fetal Well being  - Fetal Tracing: cat 1 - Group B Streptococcus ppx not indicated: GBS neg - Presentation: cephalic confirmed by cephalic   3. Routine OB: - Prenatal labs reviewed, as above - Rh pos - CBC, T&S, RPR on admit - Reg diet, IVF  4. Induction of labor  -  Contractions monitored with external toco -  Pelvis proven to 3350g  -  Plan for induction with oxytocin  -  AROM as appropriate  -  Plan for  continuous fetal  monitoring -  Maternal pain control as desired; planning regional anesthesia - Anticipate vaginal delivery  5. Post Partum Planning: - Infant feeding: TBD - Contraception: TBD - Tdap vaccine: given 12/23/2020 - Flu vaccine: declined   Gustavo Lah, CNM 02/06/21 12:18 PM  Margaretmary Eddy, CNM Certified Nurse Midwife [facility]

## 2021-02-06 NOTE — Discharge Summary (Signed)
Obstetrical Discharge Summary  Patient Name: Andrea Weiss DOB: May 24, 1996 MRN: [id number]  Date of Admission: 02/06/2021 Date of Delivery: 02/06/2021 Delivered by: Margaretmary Eddy, CNM  Date of Discharge: 02/08/2021  Primary OB: [facility] VOZ:[id number] last menstrual period was 05/04/2020. EDC Estimated Date of Delivery: 02/08/21 Gestational Age at Delivery: [redacted]w[redacted]d   Antepartum complications:  1.Factors complicating this pregnancy 2.Low platelets 3.Subchorionic Hemorrhage 4.Previous Smoker 5.H/o mental health diagnoses 6.Elevated A1c at NOB 7.Anemia 8. Marijuana use in pregnancy  Admitting Diagnosis: Elective IOL at term  Secondary Diagnosis: Patient Active Problem List   Diagnosis Date Noted  . Encounter for elective induction of labor 02/06/2021  . Marijuana smoker 02/05/2021  . Encounter for supervision of other normal pregnancy, third trimester 07/21/2020   Induction: AROM and Pitocin Complications: Shoulder dystocia - resolved after 42 seconds with McRoberts and suprapubic pressure  Intrapartum complications/course: Andrea Weiss presented to L&D for scheduled elective IOL at term.  She progressed well with oxytocin and had rapid cervical change after AROM.  She had a strong urge to push at C/C/+2.  Andrea Weiss pushed quickly over approximately 10 minutes.  Attempted delivery of anterior shoulder.  Maternal head lowered to provide more pelvic space and again attempted delivery of anterior should with gentle downward traction. Shoulder dystocia identified. Nursing resources called to room, nursery present at delivery.  Dystocia resolved with McRoberts maneuver and suprapubic pressure after 42 seconds. Anterior then posterior shoulders delivered with gentle downward traction. Vigorous baby placed on mom's chest, and attended to by baby RN. Delivery Type: spontaneous vaginal delivery Anesthesia: epidural Placenta: spontaneous Laceration: None Episiotomy:  none Newborn Data: Live born female "Delorise Shiner" Birth Weight:  7#5 APGAR: 8, 9  Newborn Delivery   Birth date/time: 02/06/2021 18:32:00 Delivery type: Vaginal, Spontaneous     Postpartum Procedures: none  Edinburgh:  Edinburgh Postnatal Depression Scale Screening Tool 02/06/2021 12/27/2017 11/17/2017  I have been able to laugh and see the funny side of things. 0 0 0  I have looked forward with enjoyment to things. 0 0 0  I have blamed myself unnecessarily when things went wrong. 0 0 0  I have been anxious or worried for no good reason. 0 0 0  I have felt scared or panicky for no good reason. 0 0 0  Things have been getting on top of me. 0 0 0  I have been so unhappy that I have had difficulty sleeping. 0 0 0  I have felt sad or miserable. 0 0 0  I have been so unhappy that I have been crying. 0 0 0  The thought of harming myself has occurred to me. 0 0 0  Edinburgh Postnatal Depression Scale Total 0 0 0     Post partum course:  Patient had an uncomplicated postpartum course.  By time of discharge on PPD#2, her pain was controlled on oral pain medications; she had appropriate lochia and was ambulating, voiding without difficulty and tolerating regular diet.  She was deemed stable for discharge to home.     Discharge Physical Exam:  BP 128/87 (BP Location: Left Arm)   Pulse 79   Temp 98.9 F (37.2 C) (Oral)   Resp 20   Ht 5\' 4"  (1.626 m)   Wt 63.5 kg   LMP 05/04/2020 Comment: normal  SpO2 100%   Breastfeeding Unknown   BMI 24.03 kg/m   General: NAD CV: RRR Pulm: CTABL, nl effort ABD: s/nd/nt, fundus firm and below the umbilicus Lochia: moderate Perineum:minimal edema/intact DVT Evaluation:  LE non-ttp, no evidence of DVT on exam.  Hemoglobin  Date Value Ref Range Status  02/07/2021 9.4 (L) 12.0 - 15.0 g/dL Final  19/14/7829 56.2 (L) 11.1 - 15.9 g/dL Final   HCT  Date Value Ref Range Status  02/07/2021 29.7 (L) 36.0 - 46.0 % Final   Hematocrit  Date Value Ref Range  Status  10/18/2017 31.5 (L) 34.0 - 46.6 % Final     Disposition: stable, discharge to home. Baby Feeding: breastmilk  Baby Disposition: home with mom  Rh Immune globulin given: Rh pos Rubella vaccine given: Immune Varivax vaccine given: Immune Flu vaccine given in AP or PP setting: declined  Tdap vaccine given in AP or PP setting: given 12/23/2020  Contraception: IUD desired.   Prenatal Labs:  Blood type/Rh O pos  Antibody screen neg  Rubella Immune  Varicella Immune  RPR NR  HBsAg Neg  HIV NR  GC neg  Chlamydia neg  Genetic screening cfDNAnegative  1 hour GTT 94  3 hour GTT N/A  GBS Neg    Plan:  Andrea Weiss was discharged to home in good condition. Follow-up appointment with delivering provider in 6 weeks.  Discharge Medications: Allergies as of 02/08/2021      Reactions   Mango Flavor Hives, Swelling   Allergy to mangos      Medication List    TAKE these medications   acetaminophen 500 MG tablet Commonly known as: TYLENOL Take 2 tablets (1,000 mg total) by mouth every 6 (six) hours as needed (for pain scale < 4).   benzocaine-Menthol 20-0.5 % Aero Commonly known as: DERMOPLAST Apply 1 application topically as needed for irritation (perineal discomfort).   cetirizine 10 MG tablet Commonly known as: ZYRTEC Take 1 tablet (10 mg total) by mouth daily.   coconut oil Oil Apply 1 application topically as needed.   docusate sodium 100 MG capsule Commonly known as: COLACE Take 1 capsule (100 mg total) by mouth 2 (two) times daily.   ferrous sulfate 325 (65 FE) MG tablet Take 1 tablet (325 mg total) by mouth 2 (two) times daily with a meal.   ibuprofen 600 MG tablet Commonly known as: ADVIL Take 1 tablet (600 mg total) by mouth every 6 (six) hours.   multivitamin-prenatal 27-0.8 MG Tabs tablet Take 2 tablets by mouth daily at 12 noon. 2 gummies per day   sertraline 25 MG tablet Commonly known as: ZOLOFT Take 25 mg by mouth daily.    simethicone 80 MG chewable tablet Commonly known as: MYLICON Chew 1 tablet (80 mg total) by mouth as needed for flatulence.   witch hazel-glycerin pad Commonly known as: TUCKS Apply 1 application topically continuous.        Follow-up Information    Gustavo Lah, CNM. Schedule an appointment as soon as possible for a visit in 6 week(s).   Specialty: Certified Nurse Midwife Why: postpartum visit Contact information: [address] [place] [address] [phone]        [facility] Follow up in 2 week(s).   Why: mood check Contact information: [address] [place] [address] [phone]              Signed: Randa Ngo, CNM 02/08/2021 8:44 AM

## 2021-02-06 NOTE — Discharge Instructions (Signed)
Vaginal Delivery, Care After Refer to this sheet in the next few weeks. These discharge instructions provide you with information on caring for yourself after delivery. Your caregiver may also give you specific instructions. Your treatment has been planned according to the most current medical practices available, but problems sometimes occur. Call your caregiver if you have any problems or questions after you go home. HOME CARE INSTRUCTIONS 1. Take over-the-counter or prescription medicines only as directed by your caregiver or pharmacist. 2. Do not drink alcohol, especially if you are breastfeeding or taking medicine to relieve pain. 3. Do not smoke tobacco. 4. Continue to use good perineal care. Good perineal care includes: 1. Wiping your perineum from back to front 2. Keeping your perineum clean. 3. You can do sitz baths twice a day, to help keep this area clean 5. Do not use tampons, douche or have sex until your caregiver says it is okay. 6. Shower only and avoid sitting in submerged water, aside from sitz baths 7. Wear a well-fitting bra that provides breast support. 8. Eat healthy foods. 9. Drink enough fluids to keep your urine clear or pale yellow. 10. Eat high-fiber foods such as whole grain cereals and breads, brown rice, beans, and fresh fruits and vegetables every day. These foods may help prevent or relieve constipation. 11. Avoid constipation with high fiber foods or medications, such as miralax or metamucil 12. Follow your caregiver's recommendations regarding resumption of activities such as climbing stairs, driving, lifting, exercising, or traveling. 13. Talk to your caregiver about resuming sexual activities. Resumption of sexual activities is dependent upon your risk of infection, your rate of healing, and your comfort and desire to resume sexual activity. 14. Try to have someone help you with your household activities and your newborn for at least a few days after you leave  the hospital. 15. Rest as much as possible. Try to rest or take a nap when your newborn is sleeping. 16. Increase your activities gradually. 17. Keep all of your scheduled postpartum appointments. It is very important to keep your scheduled follow-up appointments. At these appointments, your caregiver will be checking to make sure that you are healing physically and emotionally. SEEK MEDICAL CARE IF:   You are passing large clots from your vagina. Save any clots to show your caregiver.  You have a foul smelling discharge from your vagina.  You have trouble urinating.  You are urinating frequently.  You have pain when you urinate.  You have a change in your bowel movements.  You have increasing redness, pain, or swelling near your vaginal incision (episiotomy) or vaginal tear.  You have pus draining from your episiotomy or vaginal tear.  Your episiotomy or vaginal tear is separating.  You have painful, hard, or reddened breasts.  You have a severe headache.  You have blurred vision or see spots.  You feel sad or depressed.  You have thoughts of hurting yourself or your newborn.  You have questions about your care, the care of your newborn, or medicines.  You are dizzy or light-headed.  You have a rash.  You have nausea or vomiting.  You were breastfeeding and have not had a menstrual period within 12 weeks after you stopped breastfeeding.  You are not breastfeeding and have not had a menstrual period by the 12th week after delivery.  You have a fever. SEEK IMMEDIATE MEDICAL CARE IF:   You have persistent pain.  You have chest pain.  You have shortness of breath.    You faint.  You have leg pain.  You have stomach pain.  Your vaginal bleeding saturates two or more sanitary pads in 1 hour. MAKE SURE YOU:   Understand these instructions.  Will watch your condition.  Will get help right away if you are not doing well or get worse. Document Released:  11/04/2000 Document Revised: 03/24/2014 Document Reviewed: 07/04/2012 ExitCare Patient Information 2015 [employer]. This information is not intended to replace advice given to you by your health care provider. Make sure you discuss any questions you have with your health care provider.  Sitz Bath A sitz bath is a warm water bath taken in the sitting position. The water covers only the hips and butt (buttocks). We recommend using one that fits in the toilet, to help with ease of use and cleanliness. It may be used for either healing or cleaning purposes. Sitz baths are also used to relieve pain, itching, or muscle tightening (spasms). The water may contain medicine. Moist heat will help you heal and relax.  HOME CARE  Take 3 to 4 sitz baths a day. 18. Fill the bathtub half-full with warm water. 19. Sit in the water and open the drain a little. 20. Turn on the warm water to keep the tub half-full. Keep the water running constantly. 21. Soak in the water for 15 to 20 minutes. 22. After the sitz bath, pat the affected area dry. GET HELP RIGHT AWAY IF: You get worse instead of better. Stop the sitz baths if you get worse. MAKE SURE YOU:  Understand these instructions.  Will watch your condition.  Will get help right away if you are not doing well or get worse. Document Released: 12/15/2004 Document Revised: 08/01/2012 Document Reviewed: 03/07/2011 ExitCare Patient Information 2015 [employer]. This information is not intended to replace advice given to you by your health care provider. Make sure you discuss any questions you have with your health care provider.    

## 2021-02-07 ENCOUNTER — Encounter: Payer: Self-pay | Admitting: Obstetrics and Gynecology

## 2021-02-07 LAB — CBC
HCT: 29.7 % — ABNORMAL LOW (ref 36.0–46.0)
Hemoglobin: 9.4 g/dL — ABNORMAL LOW (ref 12.0–15.0)
MCH: 26.5 pg (ref 26.0–34.0)
MCHC: 31.6 g/dL (ref 30.0–36.0)
MCV: 83.7 fL (ref 80.0–100.0)
Platelets: 152 10*3/uL (ref 150–400)
RBC: 3.55 MIL/uL — ABNORMAL LOW (ref 3.87–5.11)
RDW: 14.3 % (ref 11.5–15.5)
WBC: 8.4 10*3/uL (ref 4.0–10.5)
nRBC: 0 % (ref 0.0–0.2)

## 2021-02-07 LAB — RPR: RPR Ser Ql: NONREACTIVE

## 2021-02-07 NOTE — Progress Notes (Addendum)
Post Partum Day 1  Subjective: no complaints, up ad lib, voiding and tolerating PO  Doing well, no concerns. Ambulating without difficulty, pain managed with PO meds, tolerating regular diet, and voiding without difficulty.   No fever/chills, chest pain, shortness of breath, nausea/vomiting, or leg pain. No nipple or breast pain. No headache, visual changes, or RUQ/epigastric pain.  Objective: BP 119/79 (BP Location: Left Arm)   Pulse 68   Temp 98.6 F (37 C) (Oral)   Resp 20   Ht 5\' 4"  (1.626 m)   Wt 63.5 kg   LMP 05/04/2020 Comment: normal  SpO2 100% Comment: Room Air  Breastfeeding Unknown   BMI 24.03 kg/m    Physical Exam:  General: alert, cooperative and no distress Breasts: soft/nontender CV: RRR Pulm: nl effort, CTABL Abdomen: soft, non-tender, active bowel sounds Uterine Fundus: firm Perineum: minimal edema, intact Lochia: appropriate DVT Evaluation: No evidence of DVT seen on physical exam.  Recent Labs    02/06/21 1045 02/07/21 0528  HGB 10.9* 9.4*  HCT 34.4* 29.7*  WBC 6.1 8.4  PLT 171 152    Assessment/Plan: 25 y.o. 25 postpartum day # 1  -Continue routine postpartum care -Lactation consult PRN for breastfeeding -Discussed contraceptive options including implant, IUDs hormonal and non-hormonal, injection, pills/ring/patch, condoms, and NFP.  -Considering IUD -Chronic iron deficiency anemia - hemodynamically stable and asymptomatic; start PO ferrous sulfate BID with stool softeners  -Immunization status: All immunizations up to date   Disposition: Continue inpatient postpartum care    LOS: 1 day   [id number], CNM 02/07/2021, 11:00 AM   ----- 02/09/2021  Certified Nurse Midwife [facility]

## 2021-02-07 NOTE — Anesthesia Postprocedure Evaluation (Signed)
Anesthesia Post Note  Patient: Andrea Weiss  Procedure(s) Performed: AN AD HOC LABOR EPIDURAL  Patient location during evaluation: Mother Baby Anesthesia Type: Epidural Level of consciousness: awake and alert Pain management: pain level controlled Vital Signs Assessment: post-procedure vital signs reviewed and stable Respiratory status: spontaneous breathing, nonlabored ventilation and respiratory function stable Cardiovascular status: stable Postop Assessment: no headache, no backache and epidural receding Anesthetic complications: no   No complications documented.   Last Vitals:  Vitals:   02/07/21 0315 02/07/21 0753  BP: 120/79 119/79  Pulse: 66 68  Resp: 18 20  Temp: 36.9 C 37 C  SpO2: 100% 100%    Last Pain:  Vitals:   02/07/21 0753  TempSrc: Oral  PainSc:                  [name]

## 2021-02-07 NOTE — Lactation Note (Signed)
This note was copied from a baby's chart. Lactation Consultation Note  Patient Name: Andrea Weiss Date: 02/07/2021 Reason for consult: Initial assessment;Term Age:25 hours   Infant is clothed, swaddled, and cueing upon entry. Lactating patient mentioned use of coconut oil for nipple soreness.  [facility] student educated on unwrapping infant and feeding STS to obtain deeper latch.    Lactation patient noticed cueing and brought infant to breast in cradle position on left breast.  Infant having difficulty sustaining latch. Infant was latching to the nipple and pulling away.    [facility] student educated patient on cross-cradle and football position.  Patient attempted cross-cradle and infant sucked and had a few swallows with stimulation.  Infant continued having difficulty sustaining latch.  Position was switched to football and infant's latched improved.  More sucks and swallows were heard, however, infant still would not sustain latch for long.   Pacifier had recently been used according to RN.    Patient was using scissor hold to help latch infant.  [facility] student educated patient on cupping breast and providing head/neck support to help achieve deeper latch.    [facility] student educated on supply and demand, feeding cues, infant belly size, cluster feeding, breast changes, milk transfer, stool color.  Plan 1. Continue with STS 2. Continue to feed on demand 8 or more times within 24 hours 3. Unwrap infant during feedings to obtain a deeper latch  Maternal Data Has patient been taught Hand Expression?: Yes Does the patient have breastfeeding experience prior to this delivery?: Yes How long did the patient breastfeed?: 5 months  Feeding Mother's Current Feeding Choice: Breast Milk  LATCH Score Latch: Repeated attempts needed to sustain latch, nipple held in mouth throughout feeding, stimulation needed to elicit sucking reflex.  Audible Swallowing: A few with stimulation  Type of  Nipple: Everted at rest and after stimulation  Comfort (Breast/Nipple): Soft / non-tender  Hold (Positioning): Assistance needed to correctly position infant at breast and maintain latch.  LATCH Score: 7   Lactation Tools Discussed/Used    Interventions Interventions: Breast feeding basics reviewed;Assisted with latch;Skin to skin;Breast massage;Hand express;Breast compression;Adjust position;Support pillows;Position options  Discharge [facility] Program: Yes  Consult Status Consult Status: Follow-up Date: 02/08/21 Follow-up type: In-patient    Scarlette Ar 02/07/2021, 1:16 PM

## 2021-02-08 MED ORDER — FERROUS SULFATE 325 (65 FE) MG PO TABS: 325.0000 mg | ORAL_TABLET | Freq: Two times a day (BID) | ORAL | 1 refills | Status: DC

## 2021-02-08 MED ORDER — IBUPROFEN 600 MG PO TABS: 600.0000 mg | ORAL_TABLET | Freq: Four times a day (QID) | ORAL | 0 refills | Status: DC

## 2021-02-08 MED ORDER — COCONUT OIL OIL: 1.0000 "application " | TOPICAL_OIL | 0 refills | Status: DC | PRN

## 2021-02-08 MED ORDER — WITCH HAZEL-GLYCERIN EX PADS: 1.0000 "application " | MEDICATED_PAD | CUTANEOUS | 12 refills | Status: DC

## 2021-02-08 MED ORDER — BENZOCAINE-MENTHOL 20-0.5 % EX AERO
1.0000 | INHALATION_SPRAY | CUTANEOUS | Status: DC | PRN
Start: 2021-02-08 — End: 2024-04-19

## 2021-02-08 MED ORDER — MEASLES, MUMPS & RUBELLA VAC IJ SOLR: 0.5000 mL | Freq: Once | INTRAMUSCULAR | Status: DC

## 2021-02-08 MED ORDER — ACETAMINOPHEN 500 MG PO TABS: 1000.0000 mg | ORAL_TABLET | Freq: Four times a day (QID) | ORAL | 0 refills | Status: DC | PRN

## 2021-02-08 MED ORDER — SIMETHICONE 80 MG PO CHEW: 80.0000 mg | CHEWABLE_TABLET | ORAL | 0 refills | Status: DC | PRN

## 2021-02-08 MED ORDER — DOCUSATE SODIUM 100 MG PO CAPS: 100.0000 mg | ORAL_CAPSULE | Freq: Two times a day (BID) | ORAL | 0 refills | Status: DC

## 2021-02-08 NOTE — Progress Notes (Signed)
Pt discharged with infant.  Discharge instructions, prescriptions and follow up appointment given to and reviewed with pt. Pt verbalized understanding. Escorted out by auxillary. 

## 2021-02-08 NOTE — Lactation Note (Signed)
This note was copied from a baby's chart. Lactation Consultation Note  Patient Name: Andrea Weiss [id number] Date: 02/08/2021 Reason for consult: Follow-up assessment;Term Age:25 hours  Lactation follow-up before discharge. Mom reports just completed a feeding, baby asleep and calm.  Feedings continue to be going well per mom. Mom able to change latch/position to improve latch and minimize discomfort. Comfort gels and coconut oil have been used to relieve soreness.  Mom desires a hand pump for occasional pumping with dad assisting with bottle feeds. Hand pump given, use reviewed, milk storage and cleaning.  Information given for BF in the new days/weeks. Encouraged on demand feedings with early cues, tracking of output, and additional signs of adequate intake. Infant cluster feeding and growth spurts reviewed and encouraged to feed on demand.   LC provided education on breast changes, breast fullness and engorgement and management of both, nipple care, and when to seek MD care and support.  LC to send [facility] referral for birth notification and additional breastfeeding support. Outpatient lactation services reviewed with parents.   Maternal Data Has patient been taught Hand Expression?: Yes Does the patient have breastfeeding experience prior to this delivery?: Yes  Feeding Mother's Current Feeding Choice: Breast Milk  LATCH Score                    Lactation Tools Discussed/Used Tools: Comfort gels;Coconut oil;Pump Breast pump type: Manual Pump Education: Setup, frequency, and cleaning;Milk Storage Reason for Pumping: mom's choice Pumping frequency: as needed  Interventions Interventions: Breast feeding basics reviewed;Hand pump;Education  Discharge Discharge Education: Engorgement and breast care;Warning signs for feeding baby;Outpatient recommendation Pump: Manual WIC Program: Yes  Consult Status Consult Status: Complete Date: 02/08/21 Follow-up type:  Call as needed    [name] 02/08/2021, 9:45 AM

## 2021-02-08 NOTE — Progress Notes (Signed)
Post Partum Day 2 Subjective: Doing well, no complaints.  Tolerating regular diet, pain with PO meds, voiding and ambulating without difficulty.  No CP SOB Fever,Chills, N/V or leg pain; denies nipple or breast pain, no HA change of vision, RUQ/epigastric pain  Objective: BP 128/87 (BP Location: Left Arm)   Pulse 79   Temp 98.9 F (37.2 C) (Oral)   Resp 20   Ht 5\' 4"  (1.626 m)   Wt 63.5 kg   LMP 05/04/2020 Comment: normal  SpO2 100%   Breastfeeding Unknown   BMI 24.03 kg/m    Physical Exam:  General: NAD Breasts: soft/nontender CV: RRR Pulm: nl effort, CTABL Abdomen: soft, NT, BS x 4 Perineum: minimal edema, intact Lochia: small Uterine Fundus: fundus firm and 1 fb below umbilicus DVT Evaluation: no cords, ttp LEs   Recent Labs    02/06/21 1045 02/07/21 0528  HGB 10.9* 9.4*  HCT 34.4* 29.7*  WBC 6.1 8.4  PLT 171 152    Assessment/Plan: 25 y.o. 25 postpartum day # 2  - Continue routine PP care - Lactation consult - Discussed contraceptive options including implant, IUDs hormonal and non-hormonal, injection, pills/ring/patch, condoms, and NFP. Pt desires IUD.  - Acute blood loss anemia - hemodynamically stable and asymptomatic; continue po ferrous sulfate BID with stool softeners  - Immunization status: up to date, verified Rubella immunity 07/29/20 per Lab report.    Disposition: Does desire Dc home today.     09/28/20, CNM 02/08/2021  8:42 AM

## 2021-03-05 ENCOUNTER — Inpatient Hospital Stay
Admission: EM | Admit: 2021-03-05 | Discharge: 2021-03-08 | DRG: 776 | Disposition: A | Discharge status: Home / Self Care | Payer: Medicaid Other | Attending: Student | Admitting: Student

## 2021-03-05 ENCOUNTER — Emergency Department: Payer: Medicaid Other

## 2021-03-05 ENCOUNTER — Other Ambulatory Visit: Payer: Self-pay

## 2021-03-05 DIAGNOSIS — R29701 NIHSS score 1: Secondary | ICD-10-CM | POA: Diagnosis present

## 2021-03-05 DIAGNOSIS — R29810 Facial weakness: Principal | ICD-10-CM | POA: Diagnosis present

## 2021-03-05 DIAGNOSIS — Z20822 Contact with and (suspected) exposure to covid-19: Secondary | ICD-10-CM | POA: Diagnosis present

## 2021-03-05 DIAGNOSIS — Z79899 Other long term (current) drug therapy: Secondary | ICD-10-CM

## 2021-03-05 DIAGNOSIS — R4702 Dysphasia: Secondary | ICD-10-CM | POA: Diagnosis present

## 2021-03-05 DIAGNOSIS — I82409 Acute embolism and thrombosis of unspecified deep veins of unspecified lower extremity: Secondary | ICD-10-CM

## 2021-03-05 DIAGNOSIS — I639 Cerebral infarction, unspecified: Secondary | ICD-10-CM | POA: Diagnosis present

## 2021-03-05 DIAGNOSIS — Z9109 Other allergy status, other than to drugs and biological substances: Secondary | ICD-10-CM

## 2021-03-05 DIAGNOSIS — Z87440 Personal history of urinary (tract) infections: Secondary | ICD-10-CM

## 2021-03-05 DIAGNOSIS — F419 Anxiety disorder, unspecified: Secondary | ICD-10-CM | POA: Diagnosis present

## 2021-03-05 DIAGNOSIS — R471 Dysarthria and anarthria: Secondary | ICD-10-CM | POA: Diagnosis present

## 2021-03-05 DIAGNOSIS — O9081 Anemia of the puerperium: Secondary | ICD-10-CM | POA: Diagnosis present

## 2021-03-05 DIAGNOSIS — O9943 Diseases of the circulatory system complicating the puerperium: Principal | ICD-10-CM | POA: Diagnosis present

## 2021-03-05 DIAGNOSIS — E559 Vitamin D deficiency, unspecified: Secondary | ICD-10-CM | POA: Diagnosis present

## 2021-03-05 DIAGNOSIS — M79606 Pain in leg, unspecified: Secondary | ICD-10-CM

## 2021-03-05 DIAGNOSIS — F1290 Cannabis use, unspecified, uncomplicated: Secondary | ICD-10-CM | POA: Diagnosis present

## 2021-03-05 DIAGNOSIS — F17210 Nicotine dependence, cigarettes, uncomplicated: Secondary | ICD-10-CM | POA: Diagnosis present

## 2021-03-05 DIAGNOSIS — F32A Depression, unspecified: Secondary | ICD-10-CM | POA: Diagnosis present

## 2021-03-05 DIAGNOSIS — R29898 Other symptoms and signs involving the musculoskeletal system: Secondary | ICD-10-CM | POA: Diagnosis present

## 2021-03-05 LAB — COMPREHENSIVE METABOLIC PANEL
ALT: 22 U/L (ref 0–44)
AST: 20 U/L (ref 15–41)
Albumin: 4.1 g/dL (ref 3.5–5.0)
Alkaline Phosphatase: 79 U/L (ref 38–126)
Anion gap: 8 (ref 5–15)
BUN: 8 mg/dL (ref 6–20)
CO2: 25 mmol/L (ref 22–32)
Calcium: 9.1 mg/dL (ref 8.9–10.3)
Chloride: 105 mmol/L (ref 98–111)
Creatinine, Ser: 0.65 mg/dL (ref 0.44–1.00)
GFR, Estimated: >60 mL/min (ref >60)
Glucose, Bld: 106 mg/dL — ABNORMAL HIGH (ref 70–99)
Potassium: 3.8 mmol/L (ref 3.5–5.1)
Sodium: 138 mmol/L (ref 135–145)
Total Bilirubin: 0.8 mg/dL (ref 0.3–1.2)
Total Protein: 7.6 g/dL (ref 6.5–8.1)

## 2021-03-05 LAB — CBC WITH DIFFERENTIAL/PLATELET
Abs Immature Granulocytes: 0.02 10*3/uL (ref 0.00–0.07)
Basophils Absolute: 0 10*3/uL (ref 0.0–0.1)
Basophils Relative: 1 %
Eosinophils Absolute: 0.2 10*3/uL (ref 0.0–0.5)
Eosinophils Relative: 4 %
HCT: 41.6 % (ref 36.0–46.0)
Hemoglobin: 13 g/dL (ref 12.0–15.0)
Immature Granulocytes: 1 %
Lymphocytes Relative: 43 %
Lymphs Abs: 2 10*3/uL (ref 0.7–4.0)
MCH: 25.4 pg — ABNORMAL LOW (ref 26.0–34.0)
MCHC: 31.3 g/dL (ref 30.0–36.0)
MCV: 81.4 fL (ref 80.0–100.0)
Monocytes Absolute: 0.3 10*3/uL (ref 0.1–1.0)
Monocytes Relative: 7 %
Neutro Abs: 1.9 10*3/uL (ref 1.7–7.7)
Neutrophils Relative %: 44 %
Platelets: 212 10*3/uL (ref 150–400)
RBC: 5.11 MIL/uL (ref 3.87–5.11)
RDW: 14.8 % (ref 11.5–15.5)
WBC: 4.4 10*3/uL (ref 4.0–10.5)
nRBC: 0 % (ref 0.0–0.2)

## 2021-03-05 LAB — PROTIME-INR
INR: 1 (ref 0.8–1.2)
Prothrombin Time: 13 seconds (ref 11.4–15.2)

## 2021-03-05 LAB — URINALYSIS, COMPLETE (UACMP) WITH MICROSCOPIC
Bacteria, UA: NONE SEEN
Bilirubin Urine: NEGATIVE
Glucose, UA: NEGATIVE mg/dL
Ketones, ur: NEGATIVE mg/dL
Nitrite: NEGATIVE
Protein, ur: NEGATIVE mg/dL
Specific Gravity, Urine: 1.027 (ref 1.005–1.030)
pH: 6 (ref 5.0–8.0)

## 2021-03-05 LAB — POC URINE PREG, ED: Preg Test, Ur: NEGATIVE

## 2021-03-05 LAB — APTT: aPTT: 33 seconds (ref 24–36)

## 2021-03-05 MED ORDER — LORAZEPAM 0.5 MG PO TABS
0.5000 mg | ORAL_TABLET | Freq: Once | ORAL | Status: AC
  Administered 2021-03-05: 0.5 mg via ORAL
  Filled 2021-03-05: qty 1

## 2021-03-05 MED ORDER — LORAZEPAM 2 MG/ML IJ SOLN: 1.0000 mg | Freq: Once | INTRAMUSCULAR | Status: DC

## 2021-03-05 MED ORDER — LORAZEPAM 2 MG/ML IJ SOLN: 0.5000 mg | Freq: Once | INTRAMUSCULAR | Status: DC

## 2021-03-05 MED ORDER — GADOBUTROL 1 MMOL/ML IV SOLN
5.0000 mL | Freq: Once | INTRAVENOUS | Status: AC | PRN
  Administered 2021-03-05: 7.5 mL via INTRAVENOUS

## 2021-03-05 NOTE — ED Provider Notes (Signed)
[facility] Emergency Department Provider Note ____________________________________________   Event Date/Time   First MD Initiated Contact with Patient 03/05/21 2012     (approximate)  I have reviewed the triage vital signs and the nursing notes.  HISTORY  Chief Complaint Numbness   HPI Andrea Weiss is a 25 y.o. femalewho presents to the ED for evaluation of numbness and slurred speech.  Chart review indicates patient was admitted 3/19-3/21 to labor and delivery for induction of labor at term.  Induced artificially.  Complicated by shoulder dystocia requiring McRoberts maneuver.  She was discharged home in stable condition 2 days later.  G3 P2 A1.  Patient presents to the ED, accompanied by her fianc, due to slurred speech since this morning.  Patient reports, and fianc confirms, that she was normal yesterday and has been doing fine since her recent delivery, until she woke up this morning with difficulty getting her words out.  She reports stumbling over her words and further reports difficulty using her iPhone with her right hand, indicating that her coordination and fine motor control has lessened.  She reports this has been constant since this morning without waxing and waning, and this is never happened before.  She denies syncopal episodes, falls or injury.  Denies headache, vision changes, gait changes, abdominal pain, emesis.  Denies dry eye sensation, spilling liquids from her mouth when she drinks.  Denies choking episodes or sore throat.  Last known normal was last night, nearly 22 hours ago.  Patient reports she thought she may have just been tired this morning due to dealing with an infant at home, she took a long nap this afternoon around lunchtime, but awoke with similar symptoms.  Past Medical History:  Diagnosis Date  . Abdominal hernia   . Anemia   . Anxiety   . Depression   . History of anemia   . History of pica   . History of  urinary tract infection   . Medical history non-contributory     Patient Active Problem List   Diagnosis Date Noted  . Encounter for elective induction of labor 02/06/2021  . Marijuana smoker 02/05/2021  . Encounter for supervision of other normal pregnancy, third trimester 07/21/2020    Past Surgical History:  Procedure Laterality Date  . Nexplanon insertion    . WISDOM TOOTH EXTRACTION Bilateral     Prior to Admission medications   Medication Sig Start Date End Date Taking? Authorizing Provider  acetaminophen (TYLENOL) 500 MG tablet Take 2 tablets (1,000 mg total) by mouth every 6 (six) hours as needed (for pain scale < 4). 02/08/21   McVey, Prudencio Pair, CNM  benzocaine-Menthol (DERMOPLAST) 20-0.5 % AERO Apply 1 application topically as needed for irritation (perineal discomfort). 02/08/21   McVey, Prudencio Pair, CNM  cetirizine (ZYRTEC) 10 MG tablet Take 1 tablet (10 mg total) by mouth daily. Patient not taking: No sig reported 12/27/17   Marylene Land, CNM  coconut oil OIL Apply 1 application topically as needed. 02/08/21   McVey, Prudencio Pair, CNM  docusate sodium (COLACE) 100 MG capsule Take 1 capsule (100 mg total) by mouth 2 (two) times daily. 02/08/21   McVey, Prudencio Pair, CNM  ferrous sulfate 325 (65 FE) MG tablet Take 1 tablet (325 mg total) by mouth 2 (two) times daily with a meal. 02/08/21   McVey, Prudencio Pair, CNM  ibuprofen (ADVIL) 600 MG tablet Take 1 tablet (600 mg total) by mouth every 6 (six) hours. 02/08/21  McVey, Prudencio Pair, CNM  Prenatal Vit-Fe Fumarate-FA (MULTIVITAMIN-PRENATAL) 27-0.8 MG TABS tablet Take 2 tablets by mouth daily at 12 noon. 2 gummies per day Patient not taking: Reported on 02/06/2021    [provider]  sertraline (ZOLOFT) 25 MG tablet Take 25 mg by mouth daily.    [provider]  simethicone (MYLICON) 80 MG chewable tablet Chew 1 tablet (80 mg total) by mouth as needed for flatulence. 02/08/21   McVey, Prudencio Pair, CNM  witch  hazel-glycerin (TUCKS) pad Apply 1 application topically continuous. 02/08/21   McVey, Prudencio Pair, CNM    Allergies Mango flavor  No family history on file.  Social History Social History   Tobacco Use  . Smoking status: Former Smoker    Packs/day: 0.25    Types: Cigarettes    Quit date: 2021    Years since quitting: 1.2  . Smokeless tobacco: Never Used  Vaping Use  . Vaping Use: Former  . Quit date: 12/28/2019  Substance Use Topics  . Alcohol use: Not Currently    Comment: quit when she found out she was pregnant, was occassioanl  . Drug use: Not Currently    Types: Marijuana    Comment: last MJ use was 2 weeks ago    Review of Systems  Constitutional: No fever/chills Eyes: No visual changes. ENT: No sore throat. Cardiovascular: Denies chest pain. Respiratory: Denies shortness of breath. Gastrointestinal: No abdominal pain.  No nausea, no vomiting.  No diarrhea.  No constipation. Genitourinary: Negative for dysuria. Musculoskeletal: Negative for back pain. Skin: Negative for rash. Neurological: Negative for headaches, . Positive for slurred speech and right hand lessened fine motor control  ____________________________________________   PHYSICAL EXAM:  VITAL SIGNS: Vitals:   03/05/21 2010 03/05/21 2203  BP: (!) 136/95 (!) 120/91  Pulse: 90 71  Resp: 16 16  Temp: 98.6 F (37 C)   SpO2: 100% 100%     Constitutional: Alert and oriented. Well appearing and in no acute distress.  Sitting up in bed and conversational, though is noted to be occasionally stumbling over her words. Eyes: Conjunctivae are normal. PERRL. EOMI. Head: Atraumatic. Nose: No congestion/rhinnorhea. Mouth/Throat: Mucous membranes are moist.  Oropharynx non-erythematous. Neck: No stridor. No cervical spine tenderness to palpation. Cardiovascular: Normal rate, regular rhythm. Grossly normal heart sounds.  Good peripheral circulation. Respiratory: Normal respiratory effort.  No retractions.  Lungs CTAB. Gastrointestinal: Soft , nondistended, nontender to palpation. No CVA tenderness. Musculoskeletal: No lower extremity tenderness nor edema.  No joint effusions. No signs of acute trauma. Neurologic:   5/5 strength and sensation to light touch in all 4 extremities without apparent deficit. 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  Right side of her face does not move nearly as much as the left when she smiles.  Sensation intact to light touch in her face symmetrically bilaterally. Otherwise cranial nerves intact. Able to stand up and ambulate with a normal gait independently without dizziness Skin:  Skin is warm, dry and intact. No rash noted. Psychiatric: Mood and affect are normal. Speech and behavior are normal. ____________________________________________   LABS (all labs ordered are listed, but only abnormal results are displayed)  Labs Reviewed  CBC WITH DIFFERENTIAL/PLATELET - Abnormal; Notable for the following components:      Result Value   MCH 25.4 (*)  All other components within normal limits  COMPREHENSIVE METABOLIC PANEL - Abnormal; Notable for the following components:   Glucose, Bld 106 (*)    All other components within normal limits  PROTIME-INR  APTT  URINALYSIS, COMPLETE (UACMP) WITH MICROSCOPIC  POC URINE PREG, ED  POC URINE PREG, ED   ____________________________________________  12 Lead EKG  Sinus rhythm, rate of 68 bpm.  Normal axis and normal intervals.  High amplitude stigmata of LVH.  No evidence of acute ischemia. ____________________________________________  RADIOLOGY  ED MD interpretation: CT head reviewed by me without evidence of acute intracranial pathology.  Official radiology report(s): CT Head Wo Contrast  Result Date: 03/05/2021 CLINICAL DATA:  One month  postpartum. Slurred speech, right arm weakness. EXAM: CT HEAD WITHOUT CONTRAST TECHNIQUE: Contiguous axial images were obtained from the base of the skull through the vertex without intravenous contrast. COMPARISON:  None. FINDINGS: Brain: No acute intracranial abnormality. Specifically, no hemorrhage, hydrocephalus, mass lesion, acute infarction, or significant intracranial injury. Vascular: No hyperdense vessel or unexpected calcification. Skull: No acute calvarial abnormality. Sinuses/Orbits: Visualized paranasal sinuses and mastoids clear. Orbital soft tissues unremarkable. Other: None IMPRESSION: Normal study. Electronically Signed   By: Charlett NoseKevin  Dover M.D.   On: 03/05/2021 20:51    ____________________________________________   PROCEDURES and INTERVENTIONS  Procedure(s) performed (including Critical Care):  .1-3 Lead EKG Interpretation Performed by: Delton PrairieSmith, Kenard Morawski, MD Authorized by: Delton PrairieSmith, Cobi Delph, MD     Interpretation: normal     ECG rate:  70   ECG rate assessment: normal     Rhythm: sinus rhythm     Ectopy: none     Conduction: normal      Medications  LORazepam (ATIVAN) injection 0.5 mg (has no administration in time range)  LORazepam (ATIVAN) injection 1 mg (has no administration in time range)  LORazepam (ATIVAN) tablet 0.5 mg (0.5 mg Oral Given 03/05/21 2235)    ____________________________________________   MDM / ED COURSE   Otherwise healthy 25 year old female presents to the ED 1 month postpartum with about 24 hours of right-sided facial droop and difficulty with fine motor control on her right hand, concerning for central nervous pathology and requiring MRI.  Normal vitals on room air.  Exam does demonstrate what appears to be central cranial nerve VII palsy with right-sided facial droop and mild dysarthria.  No aphasia or evidence of additional cranial nerve deficits.  Further noted to have decreased fine motor control with her right hand, that she experiences as  difficulty texting with the right hand compared to her baseline.  I am unable to further elicit any additional neurologic deficits.  She is outside of any stroke window and therefore code stroke was not called.  Her work-up is benign so far with normal basic labs, patient is not pregnant.  EKG is nonischemic.  CT head without evidence of ICH or signs of acute CVA.  Uncertain if this represents an acute stroke, new diagnosis of MS, or possibly a psychosomatic diagnosis.  MRI brain and cervical spine ordered to help elucidate this.  Patient will be signed out to oncoming provider to follow-up in the study and her clinical picture.   Clinical Course as of 03/05/21 2237  Fri Mar 05, 2021  2151 Reassessed. Clinically the same. We discussed benign workup so far and my rec for MRI. She is agreeable [DS]    Clinical Course User Index [DS] Delton PrairieSmith, Cain Fitzhenry, MD    ____________________________________________   FINAL CLINICAL IMPRESSION(S) / ED DIAGNOSES  Final diagnoses:  Facial droop  Right hand weakness     ED Discharge Orders    None       Jule Schlabach Katrinka Blazing   Note:  This document was prepared using Dragon voice recognition software and may include unintentional dictation errors.   Delton Prairie, MD 03/05/21 2245

## 2021-03-05 NOTE — ED Triage Notes (Signed)
Pt presents to ER with c/o slurred speech and right arm weakness since appx 0730 this morning.  Pt is one month post partum following healthy delivery.  Pt noted to have right sided facial droop at this time, along with extremity numbness and weakness on right arm.  Pt A&O x4 at this time.

## 2021-03-05 NOTE — ED Notes (Addendum)
Patient swallowed medication and water without any difficulty. IV flushed easily. Blood return noted.

## 2021-03-05 NOTE — ED Notes (Signed)
Patient ambulated to and from hallway bathroom with a steady gait. 

## 2021-03-05 NOTE — ED Notes (Signed)
Patient taken to MRI by MRI tech

## 2021-03-05 NOTE — ED Notes (Signed)
Pharmacist called about Lorazepam being excreted in breast milk. Guidelines were given.

## 2021-03-05 NOTE — ED Notes (Signed)
Report given to Haley

## 2021-03-06 ENCOUNTER — Inpatient Hospital Stay: Payer: Medicaid Other

## 2021-03-06 ENCOUNTER — Encounter: Payer: Self-pay | Admitting: Family Medicine

## 2021-03-06 DIAGNOSIS — Z79899 Other long term (current) drug therapy: Secondary | ICD-10-CM | POA: Diagnosis not present

## 2021-03-06 DIAGNOSIS — Z8673 Personal history of transient ischemic attack (TIA), and cerebral infarction without residual deficits: Secondary | ICD-10-CM | POA: Insufficient documentation

## 2021-03-06 DIAGNOSIS — R4702 Dysphasia: Secondary | ICD-10-CM | POA: Diagnosis present

## 2021-03-06 DIAGNOSIS — O9943 Diseases of the circulatory system complicating the puerperium: Secondary | ICD-10-CM | POA: Diagnosis present

## 2021-03-06 DIAGNOSIS — R29701 NIHSS score 1: Secondary | ICD-10-CM | POA: Diagnosis present

## 2021-03-06 DIAGNOSIS — R29810 Facial weakness: Secondary | ICD-10-CM | POA: Diagnosis present

## 2021-03-06 DIAGNOSIS — O9081 Anemia of the puerperium: Secondary | ICD-10-CM | POA: Diagnosis present

## 2021-03-06 DIAGNOSIS — I63512 Cerebral infarction due to unspecified occlusion or stenosis of left middle cerebral artery: Secondary | ICD-10-CM | POA: Diagnosis not present

## 2021-03-06 DIAGNOSIS — Z20822 Contact with and (suspected) exposure to covid-19: Secondary | ICD-10-CM | POA: Diagnosis present

## 2021-03-06 DIAGNOSIS — R471 Dysarthria and anarthria: Secondary | ICD-10-CM | POA: Diagnosis present

## 2021-03-06 DIAGNOSIS — E559 Vitamin D deficiency, unspecified: Secondary | ICD-10-CM | POA: Diagnosis present

## 2021-03-06 DIAGNOSIS — F17210 Nicotine dependence, cigarettes, uncomplicated: Secondary | ICD-10-CM | POA: Diagnosis present

## 2021-03-06 DIAGNOSIS — I639 Cerebral infarction, unspecified: Secondary | ICD-10-CM | POA: Diagnosis present

## 2021-03-06 DIAGNOSIS — Z87440 Personal history of urinary (tract) infections: Secondary | ICD-10-CM | POA: Diagnosis not present

## 2021-03-06 DIAGNOSIS — F419 Anxiety disorder, unspecified: Secondary | ICD-10-CM | POA: Diagnosis present

## 2021-03-06 DIAGNOSIS — R29898 Other symptoms and signs involving the musculoskeletal system: Secondary | ICD-10-CM | POA: Diagnosis not present

## 2021-03-06 DIAGNOSIS — F1290 Cannabis use, unspecified, uncomplicated: Secondary | ICD-10-CM | POA: Diagnosis present

## 2021-03-06 DIAGNOSIS — Z9109 Other allergy status, other than to drugs and biological substances: Secondary | ICD-10-CM | POA: Diagnosis not present

## 2021-03-06 DIAGNOSIS — F32A Depression, unspecified: Secondary | ICD-10-CM | POA: Diagnosis present

## 2021-03-06 LAB — CBC
HCT: 39.6 % (ref 36.0–46.0)
Hemoglobin: 12.4 g/dL (ref 12.0–15.0)
MCH: 25.2 pg — ABNORMAL LOW (ref 26.0–34.0)
MCHC: 31.3 g/dL (ref 30.0–36.0)
MCV: 80.5 fL (ref 80.0–100.0)
Platelets: 195 10*3/uL (ref 150–400)
RBC: 4.92 MIL/uL (ref 3.87–5.11)
RDW: 15 % (ref 11.5–15.5)
WBC: 5.1 10*3/uL (ref 4.0–10.5)
nRBC: 0 % (ref 0.0–0.2)

## 2021-03-06 LAB — BASIC METABOLIC PANEL
Anion gap: 7 (ref 5–15)
BUN: 10 mg/dL (ref 6–20)
CO2: 24 mmol/L (ref 22–32)
Calcium: 8.7 mg/dL — ABNORMAL LOW (ref 8.9–10.3)
Chloride: 108 mmol/L (ref 98–111)
Creatinine, Ser: 0.59 mg/dL (ref 0.44–1.00)
GFR, Estimated: >60 mL/min (ref >60)
Glucose, Bld: 94 mg/dL (ref 70–99)
Potassium: 3.9 mmol/L (ref 3.5–5.1)
Sodium: 139 mmol/L (ref 135–145)

## 2021-03-06 LAB — URINE DRUG SCREEN, QUALITATIVE (ARMC ONLY)
Amphetamines, Ur Screen: NOT DETECTED
Barbiturates, Ur Screen: NOT DETECTED
Benzodiazepine, Ur Scrn: NOT DETECTED
Cannabinoid 50 Ng, Ur ~~LOC~~: POSITIVE — AB
Cocaine Metabolite,Ur ~~LOC~~: NOT DETECTED
MDMA (Ecstasy)Ur Screen: NOT DETECTED
Methadone Scn, Ur: NOT DETECTED
Opiate, Ur Screen: NOT DETECTED
Phencyclidine (PCP) Ur S: NOT DETECTED
Tricyclic, Ur Screen: NOT DETECTED

## 2021-03-06 LAB — RESP PANEL BY RT-PCR (FLU A&B, COVID) ARPGX2
Influenza A by PCR: NEGATIVE
Influenza B by PCR: NEGATIVE
SARS Coronavirus 2 by RT PCR: NEGATIVE

## 2021-03-06 LAB — LIPID PANEL
Cholesterol: 162 mg/dL (ref 0–200)
HDL: 68 mg/dL (ref >40)
LDL Cholesterol: 76 mg/dL (ref 0–99)
Total CHOL/HDL Ratio: 2.4 RATIO
Triglycerides: 89 mg/dL (ref <150)
VLDL: 18 mg/dL (ref 0–40)

## 2021-03-06 LAB — VITAMIN B12: Vitamin B-12: 260 pg/mL (ref 180–914)

## 2021-03-06 LAB — SEDIMENTATION RATE: Sed Rate: 16 mm/hr (ref 0–20)

## 2021-03-06 LAB — VITAMIN D 25 HYDROXY (VIT D DEFICIENCY, FRACTURES): Vit D, 25-Hydroxy: 14.64 ng/mL — ABNORMAL LOW (ref 30–100)

## 2021-03-06 LAB — C-REACTIVE PROTEIN: CRP: 0.7 mg/dL (ref <1.0)

## 2021-03-06 LAB — TSH: TSH: 1.967 u[IU]/mL (ref 0.350–4.500)

## 2021-03-06 LAB — HIV ANTIBODY (ROUTINE TESTING W REFLEX): HIV Screen 4th Generation wRfx: NONREACTIVE

## 2021-03-06 MED ORDER — ACETAMINOPHEN 325 MG PO TABS
650.0000 mg | ORAL_TABLET | Freq: Four times a day (QID) | ORAL | Status: DC | PRN
  Administered 2021-03-06 (×2): 650 mg via ORAL
  Filled 2021-03-06 (×2): qty 2

## 2021-03-06 MED ORDER — STROKE: EARLY STAGES OF RECOVERY BOOK: Freq: Once | Status: AC

## 2021-03-06 MED ORDER — MAGNESIUM HYDROXIDE 400 MG/5ML PO SUSP
30.0000 mL | Freq: Every day | ORAL | Status: DC | PRN
  Filled 2021-03-06: qty 30

## 2021-03-06 MED ORDER — ASPIRIN 300 MG RE SUPP
300.0000 mg | Freq: Every day | RECTAL | Status: DC
  Filled 2021-03-06 (×3): qty 1

## 2021-03-06 MED ORDER — FERROUS SULFATE 325 (65 FE) MG PO TABS
325.0000 mg | ORAL_TABLET | Freq: Two times a day (BID) | ORAL | Status: DC
  Administered 2021-03-06 – 2021-03-08 (×5): 325 mg via ORAL
  Filled 2021-03-06 (×5): qty 1

## 2021-03-06 MED ORDER — DOCUSATE SODIUM 100 MG PO CAPS
100.0000 mg | ORAL_CAPSULE | Freq: Two times a day (BID) | ORAL | Status: DC
  Administered 2021-03-06 – 2021-03-08 (×5): 100 mg via ORAL
  Filled 2021-03-06 (×5): qty 1

## 2021-03-06 MED ORDER — ONDANSETRON HCL 4 MG PO TABS: 4.0000 mg | ORAL_TABLET | Freq: Four times a day (QID) | ORAL | Status: DC | PRN

## 2021-03-06 MED ORDER — SIMETHICONE 80 MG PO CHEW
80.0000 mg | CHEWABLE_TABLET | ORAL | Status: DC | PRN
  Filled 2021-03-06: qty 1

## 2021-03-06 MED ORDER — TRAZODONE HCL 50 MG PO TABS
25.0000 mg | ORAL_TABLET | Freq: Every evening | ORAL | Status: DC | PRN
  Administered 2021-03-06 (×2): 25 mg via ORAL
  Filled 2021-03-06 (×2): qty 1

## 2021-03-06 MED ORDER — SERTRALINE HCL 50 MG PO TABS
25.0000 mg | ORAL_TABLET | Freq: Every day | ORAL | Status: DC
  Administered 2021-03-06 – 2021-03-08 (×3): 25 mg via ORAL
  Filled 2021-03-06 (×3): qty 1

## 2021-03-06 MED ORDER — ASPIRIN EC 325 MG PO TBEC
325.0000 mg | DELAYED_RELEASE_TABLET | Freq: Every day | ORAL | Status: DC
  Administered 2021-03-06 – 2021-03-08 (×3): 325 mg via ORAL
  Filled 2021-03-06 (×3): qty 1

## 2021-03-06 MED ORDER — ATORVASTATIN CALCIUM 20 MG PO TABS: 20.0000 mg | ORAL_TABLET | Freq: Every day | ORAL | Status: DC

## 2021-03-06 MED ORDER — ACETAMINOPHEN 650 MG RE SUPP: 650.0000 mg | Freq: Four times a day (QID) | RECTAL | Status: DC | PRN

## 2021-03-06 MED ORDER — ASPIRIN 81 MG PO CHEW
324.0000 mg | CHEWABLE_TABLET | Freq: Once | ORAL | Status: AC
  Administered 2021-03-06: 324 mg via ORAL
  Filled 2021-03-06: qty 4

## 2021-03-06 MED ORDER — SODIUM CHLORIDE 0.9 % IV SOLN: INTRAVENOUS | Status: DC

## 2021-03-06 MED ORDER — ONDANSETRON HCL 4 MG/2ML IJ SOLN: 4.0000 mg | Freq: Four times a day (QID) | INTRAMUSCULAR | Status: DC | PRN

## 2021-03-06 MED ORDER — ENOXAPARIN SODIUM 40 MG/0.4ML ~~LOC~~ SOLN
40.0000 mg | SUBCUTANEOUS | Status: DC
  Administered 2021-03-06 – 2021-03-08 (×3): 40 mg via SUBCUTANEOUS
  Filled 2021-03-06 (×3): qty 0.4

## 2021-03-06 NOTE — Progress Notes (Signed)
Inpatient Rehab Admissions Coordinator:   Met with patient  And fiance at bedside to discuss potential CIR admission. They stated interest and fiance confirms that he can provide 24/7 support at discharge.I sent a request for pre-authorization to Pt.'s medicaid plan for potential admission this week..  Will pursue for potential admit  Pending medical readiness, bed availability, and insurance authorization.    Clemens Catholic, [place], [place] Admissions Coordinator  [phone] ([place]) [phone] (office)

## 2021-03-06 NOTE — Progress Notes (Signed)
OT Cancellation Note  Patient Details Name: Andrea Weiss MRN: [id number] DOB: 12-22-1995   Cancelled Treatment:    Reason Eval/Treat Not Completed: Patient at procedure or test/ unavailable  OT consult received and chart reviewed. On first attempt this AM, pt working with speech therapist, on second attempt, laboratory presents to draw blood. Will f/u for OT evaluation at later date/time as able/pt available . Thank you.  Rejeana Brock, MS, OTR/L ascom [phone] 03/06/21, 9:04 AM

## 2021-03-06 NOTE — Progress Notes (Signed)
Received patient from ED. Patient walked from  ED stretcher to room bed. Alert and oriented times four. Lungs clear, bowels sounds x4 present, moves all extremities. No complaints of numbness or tingling. Complains of slight right arm weakness. Patient has prominent right side facial droop and some words are clear and some words are slurred. Sinus Rhythm. Will continue to monitor for change.

## 2021-03-06 NOTE — Evaluation (Signed)
Physical Therapy Evaluation Patient Details Name: Andrea Weiss MRN: [id number] DOB: [birth year] Today's Date: 03/06/2021   History of Present Illness  Pt is a 25 y/o F admitted from home on 03/05/21 with c/c of R facial droop & LUE weakness & pt endorses dysarthria, expressive dysphasia, & HA. Pt is 2 weeks postpartum. 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 PMH: anxiety, depression, anemia, abdominal hernia  Clinical Impression  Pt seen for PT evaluation with PT educating pt on risks of another stroke and f/u therapy services upon d/c. Pt demonstrates impaired balance & midline orientation as pt with R lateral lean throughout gait, requiring min assist to correct. Pt requires standing rest break during gait 2/2 fatigue & demonstrates impaired, guarded, gait pattern as noted below. 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 At this time pt herself requires assistance for safety with mobility and is unable to return to her caregiver role. Pt would benefit from intense therapies at CIR to address high level balance & gait deficits to allow her to return to independent PLOF & caregiver role. Will continue to follow pt acutely to address deficits noted.     Follow Up Recommendations CIR    Equipment Recommendations  Rolling walker with 5" wheels    Recommendations for Other Services       Precautions / Restrictions Precautions Precautions: Fall Restrictions Weight Bearing Restrictions: No      Mobility  Bed Mobility Overal bed mobility: Needs Assistance Bed Mobility: Supine to Sit;Sit to Supine     Supine to sit: Modified independent (Device/Increase time);HOB elevated Sit to supine: Supervision;HOB elevated   General bed mobility comments: extra time & effort to elevate RLE onto bed    Transfers Overall transfer level: Needs assistance    Transfers: Sit to/from Stand Sit to Stand: Supervision            Ambulation/Gait Ambulation/Gait assistance: Min assist Gait Distance (Feet): 160 Feet Assistive device: None Gait Pattern/deviations: Decreased weight shift to left Gait velocity: decreased, 1 standing rest break required   General Gait Details: R lateral lean with min assist to correct, BUE up & "on guard" decreased BUE arm swing  Stairs Stairs:  (not attempted 2/2 pt hooked to IV)          Wheelchair Mobility    Modified Rankin (Stroke Patients Only)       Balance Overall balance assessment: Needs assistance Sitting-balance support: Feet supported;No upper extremity supported Sitting balance-Leahy Scale: Good Sitting balance - Comments: supervision sitting EOB   Standing balance support: No upper extremity supported;During functional activity Standing balance-Leahy Scale: Fair                               Pertinent Vitals/Pain Pain Assessment: No/denies pain    Home Living Family/patient expects to be discharged to:: Private residence Living Arrangements: Children;Spouse/significant other (61 y/o and 68 month old) Available Help at Discharge: Family;Available PRN/intermittently (fiance works outside of the home, reports her mother in law can assist her during the day PRN) Type of Home: House Home Access: Stairs to enter Entrance Stairs-Rails: None Entrance Stairs-Number of Steps: 2 Home Layout: One level Home Equipment: None      Prior Function Level of Independence: Independent         Comments: caring  for 25 y/o and 84 month old     Hand Dominance   Dominant Hand: Right    Extremity/Trunk Assessment        Lower Extremity Assessment Lower Extremity Assessment:  (RLE not formally MMT, BLE sensation intact to light touch, BLE proprioception intact)       Communication   Communication: Expressive difficulties  Cognition Arousal/Alertness:  Awake/alert Behavior During Therapy: WFL for tasks assessed/performed Overall Cognitive Status: Within Functional Limits for tasks assessed                                 General Comments: AxO x 4 (oriented to year, initially reports current month is March with PT educating her on April)      General Comments General comments (skin integrity, edema, etc.): BP at beginning of session 140/96 mmHg (MAP 108) in RUE; educated pt on risks of another stroke, discussed f/u PT services    Exercises     Assessment/Plan    PT Assessment Patient needs continued PT services  PT Problem List Decreased mobility;Decreased activity tolerance;Decreased knowledge of use of DME;Decreased balance       PT Treatment Interventions DME instruction;Therapeutic activities;Gait training;Therapeutic exercise;Patient/family education;Stair training;Balance training;Functional mobility training;Neuromuscular re-education    PT Goals (Current goals can be found in the Care Plan section)  Acute Rehab PT Goals Patient Stated Goal: get better PT Goal Formulation: With patient Time For Goal Achievement: 03/20/21 Potential to Achieve Goals: Good    Frequency 7X/week   Barriers to discharge Decreased caregiver support pt is mother of 69 y/o and 1 y/o and fiance works outside of the home    Co-evaluation               AM-PAC PT "6 Clicks" Mobility  Outcome Measure Help needed turning from your back to your side while in a flat bed without using bedrails?: None Help needed moving from lying on your back to sitting on the side of a flat bed without using bedrails?: None Help needed moving to and from a bed to a chair (including a wheelchair)?: A Little Help needed standing up from a chair using your arms (e.g., wheelchair or bedside chair)?: None Help needed to walk in hospital room?: A Little Help needed climbing 3-5 steps with a railing? : A Little 6 Click Score: 21    End of Session  Equipment Utilized During Treatment: Gait belt Activity Tolerance: Patient tolerated treatment well Patient left: in bed;with chair alarm set (in handoff to OT) Nurse Communication: Mobility status PT Visit Diagnosis: Unsteadiness on feet (R26.81);Difficulty in walking, not elsewhere classified (R26.2)    Time: 2297-9892 PT Time Calculation (min) (ACUTE ONLY): 24 min   Charges:   PT Evaluation $PT Eval Low Complexity: 1 Low PT Treatments $Therapeutic Activity: 8-22 mins        Aleda Grana, PT, DPT 03/06/21, 10:26 AM   Sandi Mariscal 03/06/2021, 10:24 AM

## 2021-03-06 NOTE — ED Notes (Signed)
Pt returned from MRI at this time

## 2021-03-06 NOTE — Progress Notes (Signed)
Inpatient Rehab Admissions Coordinator Note:   Per therapy recommendations, pt was screened for CIR candidacy by Omni Dunsworth, MS CCC-SLP. At this time, Pt. Appears to have functional decline and is a potential  candidate for CIR. Will place order for rehab consult per protocol.  Please contact me with questions.   Ethelene Closser, MS, CCC-SLP Rehab Admissions Coordinator  [phone] (celll) [phone] (office)  

## 2021-03-06 NOTE — Progress Notes (Addendum)
[facility] Progress Note  Patient: Andrea Weiss    AVW:[id number]  DOA: 03/05/2021     Date of Service: the patient was seen and examined on 03/06/2021  Chief Complaint  Patient presents with  . Numbness   Brief hospital course: Andrea Weiss is a 25 y.o. female with medical history significant for anxiety and depression, anemia who is 2-week postpartum, and presents to the emergency room with acute onset of right facial droop and  right hand weakness, slurred speech, dysarthria and expressive dysphasia.  Patient denied any other focal deficits and no any other complaints. ED work-up: MRI brain shows small acute infarct of the left anterior forearm nucleus adjacent to the posterior limb of the left internal capsule with no hemorrhage or mass.  MRA negative.  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 Continue neuro checks every 4 hours for 24 hours. - Started  Aspirin, LDL 76 not very high so discontinued Lipitor as patient is breast-feeding, which is a contraindication.  Neurologist agreed -f/u globin A1c and TSH level -F/u 2D echo with bubble study, Carotid Doppler And lower extremity venous duplex - PT/OT and ST consulted, patient will need outpatient PT and OT and speech therapy. - Neurology consulted, Dr. Derry Lory a was notified by admitting physician  F/u autoimmune and hypercoagulable work-up ordered by neurology    # Depression. - We will continue Zoloft.  # Anemia. - We will continue ferrous sulfate.   Body mass index is 23.17 kg/m.   Interventions:        Diet: Heart healthy DVT Prophylaxis: Subcutaneous Lovenox   Advance goals of care discussion: Full code  Family Communication: family was Not present at bedside, at  the time of interview.  The pt provided permission to discuss medical plan with the family. Opportunity was given to ask question and all questions were answered satisfactorily.   Disposition:  Pt is from Home, admitted with acute CVA, still has slurred speech and right-sided weakness, stroke work-up pending, which precludes a safe discharge. Discharge to home most likely in 1 to 2 days.  Subjective: No significant overnight issues, patient still has expressive aphasia, word finding difficulty and right hand weakness.  Patient does not feel worsening of symptoms and no new neurological symptoms.  Denies any headache or blurry vision.    Physical Exam: General:  alert oriented to time, place, and person.  Appear in mild distress, affect appropriate Eyes: PERRLA ENT: Oral Mucosa Clear, moist  Neck: no JVD,  Cardiovascular: S1 and S2 Present, no Murmur,  Respiratory: good respiratory effort, Bilateral Air entry equal and Decreased, no Crackles, no wheezes Abdomen: Bowel Sound present, Soft and no tenderness,  Skin: no rashes Extremities: no Pedal edema, n calf tenderness Neurologic: Right-sided facial droop and right hand weakness, no any other focal deficits. Gait not checked due to patient safety concerns  Vitals:   03/05/21 2203 03/06/21 0004 03/06/21 0207 03/06/21 0741  BP: (!) 120/91 (!) 131/104 (!) 140/98 117/84  Pulse: 71 70 64 60  Resp: 16 16 16 17   Temp:   97.9 F (36.6 C) 98.8 F (37.1 C)  TempSrc:      SpO2: 100% 100% 100% 100%  Weight:      Height:        Intake/Output  Summary (Last 24 hours) at 03/06/2021 1144 Last data filed at 03/06/2021 0600 Gross per 24 hour  Intake 344.4 ml  Output --  Net 344.4 ml   Filed Weights   03/05/21 2011  Weight: 61.2 kg    Data Reviewed: I have personally reviewed and interpreted daily labs, tele strips, imagings as discussed above. I reviewed all nursing notes, pharmacy notes, vitals, pertinent old records I have discussed  plan of care as described above with RN and patient/family.  CBC: Recent Labs  Lab 03/05/21 2040 03/06/21 0606  WBC 4.4 5.1  NEUTROABS 1.9  --   HGB 13.0 12.4  HCT 41.6 39.6  MCV 81.4 80.5  PLT 212 195   Basic Metabolic Panel: Recent Labs  Lab 03/05/21 2040 03/06/21 0606  NA 138 139  K 3.8 3.9  CL 105 108  CO2 25 24  GLUCOSE 106* 94  BUN 8 10  CREATININE 0.65 0.59  CALCIUM 9.1 8.7*    Studies: CT Head Wo Contrast  Result Date: 03/05/2021 CLINICAL DATA:  One month postpartum. Slurred speech, right arm weakness. EXAM: CT HEAD WITHOUT CONTRAST TECHNIQUE: Contiguous axial images were obtained from the base of the skull through the vertex without intravenous contrast. COMPARISON:  None. FINDINGS: Brain: No acute intracranial abnormality. Specifically, no hemorrhage, hydrocephalus, mass lesion, acute infarction, or significant intracranial injury. Vascular: No hyperdense vessel or unexpected calcification. Skull: No acute calvarial abnormality. Sinuses/Orbits: Visualized paranasal sinuses and mastoids clear. Orbital soft tissues unremarkable. Other: None IMPRESSION: Normal study. Electronically Signed   By: Charlett Nose M.D.   On: 03/05/2021 20:51   MR ANGIO HEAD WO CONTRAST  Result Date: 03/06/2021 CLINICAL DATA:  Unilateral weakness EXAM: MRA HEAD WITHOUT CONTRAST TECHNIQUE: Angiographic images of the Circle of Willis were obtained using MRA technique without intravenous contrast. COMPARISON:  None. FINDINGS: POSTERIOR CIRCULATION: --Vertebral arteries: Normal --Inferior cerebellar arteries: Normal. --Basilar artery: Normal. --Superior cerebellar arteries: Normal. --Posterior cerebral arteries: Normal. ANTERIOR CIRCULATION: --Intracranial internal carotid arteries: Normal. --Anterior cerebral arteries (ACA): Normal. --Middle cerebral arteries (MCA): Normal. ANATOMIC VARIANTS: Both P comms are present. IMPRESSION: Normal intracranial MRA. Electronically Signed   By: Deatra Robinson M.D.    On: 03/06/2021 00:10   MR Brain W and Wo Contrast  Result Date: 03/06/2021 CLINICAL DATA:  Slurred speech and facial numbness EXAM: MRI HEAD WITHOUT AND WITH CONTRAST TECHNIQUE: Multiplanar, multiecho pulse sequences of the brain and surrounding structures were obtained without and with intravenous contrast. CONTRAST:  7.23mL GADAVIST GADOBUTROL 1 MMOL/ML IV SOLN COMPARISON:  Head CT 03/05/2021 FINDINGS: Brain: There is a focus of abnormal diffusion restriction in the left lentiform nucleus adjacent to the posterior limb of the left internal capsule. No acute or chronic hemorrhage. Mild edema at the site of diffusion restriction but otherwise normal white matter signal, parenchymal volume and CSF spaces. The midline structures are normal. There is no abnormal contrast enhancement. Vascular: Major flow voids are preserved. Skull and upper cervical spine: Normal calvarium and skull base. Visualized upper cervical spine and soft tissues are normal. Sinuses/Orbits:No paranasal sinus fluid levels or advanced mucosal thickening. No mastoid or middle ear effusion. Normal orbits. 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 No hemorrhage or mass effect. Electronically Signed   By: Deatra Robinson M.D.   On: 03/06/2021 00:08   MR Cervical Spine W or Wo Contrast  Result Date: 03/06/2021 CLINICAL DATA:  Unilateral weakness and slurred speech EXAM: MRI  CERVICAL SPINE WITHOUT AND WITH CONTRAST TECHNIQUE: Multiplanar and multiecho pulse sequences of the cervical spine, to include the craniocervical junction and cervicothoracic junction, were obtained without and with intravenous contrast. CONTRAST:  7.57mL GADAVIST GADOBUTROL 1 MMOL/ML IV SOLN COMPARISON:  None. FINDINGS: Alignment: Physiologic. Reversal of normal cervical lordosis may be positional or due to muscle spasm. Vertebrae: No fracture, evidence of discitis, or bone lesion. Cord: Normal  signal and morphology. Posterior Fossa, vertebral arteries, paraspinal tissues: Negative. Disc levels: No spinal canal or neural foraminal stenosis. IMPRESSION: Reversal of normal cervical lordosis may be positional or due to muscle spasm. Otherwise normal MRI of the cervical spine. Electronically Signed   By: Deatra Robinson M.D.   On: 03/06/2021 00:09    Scheduled Meds: . aspirin  300 mg Rectal Daily   Or  . aspirin EC  325 mg Oral Daily  . atorvastatin  20 mg Oral Daily  . docusate sodium  100 mg Oral BID  . enoxaparin (LOVENOX) injection  40 mg Subcutaneous Q24H  . ferrous sulfate  325 mg Oral BID WC  . LORazepam  0.5 mg Intravenous Once  . LORazepam  1 mg Intravenous Once  . sertraline  25 mg Oral Daily   Continuous Infusions: . sodium chloride 100 mL/hr at 03/06/21 0304   PRN Meds: acetaminophen **OR** acetaminophen, magnesium hydroxide, ondansetron **OR** ondansetron (ZOFRAN) IV, simethicone, traZODone  Time spent: 35 minutes  Author: Gillis Santa. MD [facility] 03/06/2021 11:44 AM  To reach On-call, see care teams to locate the attending and reach out to them via [email or web address]. If 7PM-7AM, please contact night-coverage If you still have difficulty reaching the attending provider, please page the [facility] (Director on Call) for [facility] on amion for assistance.

## 2021-03-06 NOTE — Consult Note (Signed)
NEUROLOGY CONSULTATION NOTE   Date of service: March 06, 2021 Patient Name: Andrea Weiss MRN:  [id number] DOB:  26-May-1996 Reason for consult: "Stroke" _ _ _   _ __   _ __ _ _  __ __   _ __   __ _  History of Present Illness  Andrea Weiss is a 25 y.o. female with PMH significant for depression, anxiety, recent uncomplicated pregnancy and now post partum, hx of miscarriage, who presents with acute onset slurred and slowed speech.  Reports that she woke up on 03/05/21 with her face somewhat asymmetric and has trouble getting words out. She also noted that she has been dropping things from her R hand. She went to bed at 0000 on 03/05/21.  When symptoms persisted, she eventually came to the ED. Reports pain in the inguinal region of her R leg for the last week.  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 MRI C spine was not significant. Further stroke workup with MR Angio head w/o contrast with no significant abnormality.  She denies any personal prior or famiyl hx of stroke. Smokes about 1 cigarette a day that she resumed about a week ago, smokes marijuana and has been doing it for about a month now. Endorses 1 miscarriage in the past.  MRS: 0 NIHSS: 1(mild R facial asymmetry) TPA/Thrombectomy: No, outside window, NIHSS of 0 LKW: 0000 on 03/05/21.   ROS   Constitutional Denies weight loss, fever and chills.   HEENT Denies changes in vision and hearing.   Respiratory Denies SOB and cough.   CV Denies palpitations and CP   GI Denies abdominal pain, nausea, vomiting and diarrhea.   GU Denies dysuria and urinary frequency.   MSK Denies myalgia and joint pain.   Skin Denies rash and pruritus.   Neurological Denies headache and syncope.   Psychiatric Denies recent changes in mood. Denies anxiety and depression.    Past History   Past Medical History:  Diagnosis Date  .  Abdominal hernia   . Anemia   . Anxiety   . Depression   . History of anemia   . History of pica   . History of urinary tract infection   . Medical history non-contributory    Past Surgical History:  Procedure Laterality Date  . Nexplanon insertion    . WISDOM TOOTH EXTRACTION Bilateral    History reviewed. No pertinent family history. Social History   Socioeconomic History  . Marital status: Single    Spouse name: Chris/fiance  . Number of children: 1  . Years of education: Not on file  . Highest education level: Not on file  Occupational History  . Not on file  Tobacco Use  . Smoking status: Former Smoker    Packs/day: 0.25    Types: Cigarettes    Quit date: 2021    Years since quitting: 1.2  . Smokeless tobacco: Never Used  Vaping Use  . Vaping Use: Former  . Quit date: 12/28/2019  Substance and Sexual Activity  . Alcohol use: Not Currently    Comment: quit when she found out she was pregnant, was occassioanl  . Drug use: Not Currently    Types: Marijuana    Comment: last MJ use was 2 weeks ago  . Sexual activity: Yes    Birth control/protection: I.U.D.  Other Topics Concern  . Not on file  Social History Narrative  .  Not on file   Social Determinants of Health   Financial Resource Strain: Not on file  Food Insecurity: Not on file  Transportation Needs: Not on file  Physical Activity: Not on file  Stress: Not on file  Social Connections: Not on file   Allergies  Allergen Reactions  . Mango Flavor Hives and Swelling    Allergy to mangos    Medications   Medications Prior to Admission  Medication Sig Dispense Refill Last Dose  . acetaminophen (TYLENOL) 500 MG tablet Take 2 tablets (1,000 mg total) by mouth every 6 (six) hours as needed (for pain scale < 4). 30 tablet 0   . benzocaine-Menthol (DERMOPLAST) 20-0.5 % AERO Apply 1 application topically as needed for irritation (perineal discomfort).     . cetirizine (ZYRTEC) 10 MG tablet Take 1 tablet  (10 mg total) by mouth daily. (Patient not taking: No sig reported) 14 tablet 2   . coconut oil OIL Apply 1 application topically as needed.  0   . docusate sodium (COLACE) 100 MG capsule Take 1 capsule (100 mg total) by mouth 2 (two) times daily. 10 capsule 0   . ferrous sulfate 325 (65 FE) MG tablet Take 1 tablet (325 mg total) by mouth 2 (two) times daily with a meal. 60 tablet 1   . ibuprofen (ADVIL) 600 MG tablet Take 1 tablet (600 mg total) by mouth every 6 (six) hours. 30 tablet 0   . Prenatal Vit-Fe Fumarate-FA (MULTIVITAMIN-PRENATAL) 27-0.8 MG TABS tablet Take 2 tablets by mouth daily at 12 noon. 2 gummies per day (Patient not taking: Reported on 02/06/2021)     . sertraline (ZOLOFT) 25 MG tablet Take 25 mg by mouth daily.     . simethicone (MYLICON) 80 MG chewable tablet Chew 1 tablet (80 mg total) by mouth as needed for flatulence. 30 tablet 0   . witch hazel-glycerin (TUCKS) pad Apply 1 application topically continuous. 40 each 12      Vitals   Vitals:   03/06/21 0004 03/06/21 0207 03/06/21 0741 03/06/21 1159  BP: (!) 131/104 (!) 140/98 117/84 (!) 136/99  Pulse: 70 64 60 62  Resp: _0 Temp:  97.9 F (36.6 C) 98.8 F (37.1 C) 98.7 F (37.1 C)  TempSrc:      SpO2: 100% 100% 100% 100%  Weight:      Height:         Body mass index is 23.17 kg/m.  Physical Exam   General: Laying comfortably in bed; in no acute distress.  HENT: Normal oropharynx and mucosa. Normal external appearance of ears and nose.  Neck: Supple, no pain or tenderness  CV: No JVD. No peripheral edema.  Pulmonary: Symmetric Chest rise. Normal respiratory effort.  Abdomen: Soft to touch, non-tender.  Ext: No cyanosis, edema, or deformity  Skin: No rash. Normal palpation of skin.   Musculoskeletal: Normal digits and nails by inspection. No clubbing.   Neurologic Examination  Mental status/Cognition: Alert, oriented to self, place, month and year, good attention.  Speech/language: Fluent,  comprehension intact, object naming intact, repetition intact.  Cranial nerves:   CN II Pupils equal and reactive to light, no VF deficits    CN III,IV,VI EOM intact, no gaze preference or deviation, no nystagmus    CN V normal sensation in V1, V2, and V3 segments bilaterally   CN VII Mild flattening of nasolabial fold on the right.   CN VIII normal hearing to speech    CN  IX & X normal palatal elevation, no uvular deviation    CN XI 5/5 head turn and 5/5 shoulder shrug bilaterally    CN XII midline tongue protrusion    Motor:  Muscle bulk: normal, tone normal, pronator drift none Mvmt Root Nerve  Muscle Right Left Comments  SA C5/6 Ax Deltoid 4+ 5   EF C5/6 Mc Biceps 5 5   EE C6/7/8 Rad Triceps 5 5   WF C6/7 Med FCR     WE C7/8 PIN ECU     F Ab C8/T1 U ADM/FDI 4+ 5   HF L1/2/3 Fem Illopsoas 4+ 5   KE L2/3/4 Fem Quad 5 5   DF L4/5 D Peron Tib Ant 5 5   PF S1/2 Tibial Grc/Sol 5 5    Reflexes:  Right Left Comments  Pectoralis      Biceps (C5/6) 2 2   Brachioradialis (C5/6) 2 2    Triceps (C6/7) 2 2    Patellar (L3/4) 2 2    Achilles (S1)      Hoffman      Plantar     Jaw jerk    Sensation:  Light touch    Pin prick    Temperature    Vibration   Proprioception    Coordination/Complex Motor:  - Finger to Nose intact BL - Heel to shin intact BL - Rapid alternating movement are slowed in RUE. - Gait: Deferred.  Labs   CBC:  Recent Labs  Lab 03/05/21 2040 03/06/21 0606  WBC 4.4 5.1  NEUTROABS 1.9  --   HGB 13.0 12.4  HCT 41.6 39.6  MCV 81.4 80.5  PLT 212 932    Basic Metabolic Panel:  Lab Results  Component Value Date   NA 139 03/06/2021   K 3.9 03/06/2021   CO2 24 03/06/2021   GLUCOSE 94 03/06/2021   BUN 10 03/06/2021   CREATININE 0.59 03/06/2021   CALCIUM 8.7 (L) 03/06/2021   GFRNONAA >60 03/06/2021   GFRAA >60 03/28/2020   Lipid Panel:  Lab Results  Component Value Date   LDLCALC 76 03/06/2021   HgbA1c: No results found for: HGBA1C Urine  Drug Screen:     Component Value Date/Time   LABOPIA NONE DETECTED 02/06/2021 [place] 02/06/2021 1045   LABBENZ NONE DETECTED 02/06/2021 1045   AMPHETMU NONE DETECTED 02/06/2021 1045   THCU NONE DETECTED 02/06/2021 1045   LABBARB NONE DETECTED 02/06/2021 1045    Alcohol Level No results found for: [place]  CT Head without contrast: CTH was negative for a large hypodensity concerning for a large territory infarct or hyperdensity concerning for an ICH  MRI Brain  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  MR Angio head w/o contrast: Pending  US Carotid duplex: Pending  TTE: Pending.  Impression   Andrea Weiss is a 26 y.o. 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 Her neurologic examination is notable for mild R facial asymmetry, mild RUE and RLE weakness notable only on detailed neuro exam. NIHSS is a 1.  Will work her up as a stroke in a young patient, specially given her hx  of miscarriage, often seen in APLA. In addition, given her recent pregnancy, amniotic fluid emboli vs Venous thrombi potentially crossing over to through a shunt to the arterial side is also a consideration. I discussed with her about quitting smoking and marijuana which are potential risk factors for stroke.  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 - Vascular imaging with MRA Angio Head without contrast is negative - US Carotid doppler is  pending. - TTE with bubble study is pending. - If TTE is negative, will get TEE. - LDL of 76, started on Atorvastatin 72m daily - HbA1c is pending. - Antithrombotic - Aspirin 853mdaily - Recommend DVT ppx - SBP goal - gradual normotension - Recommend Telemetry monitoring for arrythmia - Recommend bedside swallow screen prior to PO intake. - Stroke education booklet - Recommend PT/OT/SLP consult - Urine Tox screen is pending. - I ordered [place] lower ext venous to evaluate for potential DVTs due to reported R leg pain x 1 week. - I ordered hypercoagulable workup with ANA IFA, MTHFR, AT3, FVL, Protein C and S, ESR, CRP, DSDNA, APLA, ANCA Ab, Alpha galactosidase.  ______________________________________________________________________   Thank you for the opportunity to take part in the care of this patient. If you have any further questions, please contact the neurology consultation attending.  Signed,  [place] Number [phone] _ _   _ __   _ __ _ _  __ __   _ __   __ _

## 2021-03-06 NOTE — H&P (Signed)
[place]   PATIENT NAME: Andrea Weiss    MR#:  [id number]  DATE OF BIRTH:  [birth year]  DATE OF ADMISSION:  03/05/2021  PRIMARY CARE PHYSICIAN: Department, [facility]   Patient is coming from: Home  REQUESTING/REFERRING PHYSICIAN: Willy Eddy, MD  CHIEF COMPLAINT:  Right facial droop and left arm weakness   HISTORY OF PRESENT ILLNESS:  Andrea Weiss is a 25 y.o. female with medical history significant for anxiety and depression, anemia who is 2-week postpartum, and presents to the emergency room with acute onset of right facial droop and left upper extremity weakness without paresthesias.  She admits to dysarthria and expressive dysphasia.  She has been having headache without dizziness or blurred vision or diplopia.  No vertigo or tinnitus.  No urinary or stool incontinence.  No witnessed seizures.  No dysphagia.  The patient denied any chest pain or palpitations, cough or wheezing or dyspnea or hemoptysis.  No other bleeding diathesis. ED Course: When she came to the ER blood pressure was 136/95 with otherwise normal vital signs.  Labs revealed unremarkable CMP and CBC.Marland Kitchen  Urine pregnancy test was negative.  Respiratory panel is currently pending.  UA showed 11-20 WBCs with positive mucus and trace leukocyte esterase. EKG as reviewed by me : Is pending. Imaging: No acute abnormalities.  Intracranial MRA came back normal.  Brain MRI with and without contrast still revealed small acute infarct of the left lentiform nucleus adjacent to the posterior limb of the left internal capsule with no hemorrhage or mass-effect.  C-spine MRI revealed reversal of normal cervical lordosis may be positional or due to muscle spasm.  MRI was otherwise normal.  The patient was given 0.5 mg of IV Ativan twice and additional 1 mg IV and 4 baby aspirin.  She will be admitted to a medical monitored bed for further evaluation and management. PAST MEDICAL HISTORY:   Past Medical  History:  Diagnosis Date  . Abdominal hernia   . Anemia   . Anxiety   . Depression   . History of anemia   . History of pica   . History of urinary tract infection   . Medical history non-contributory     PAST SURGICAL HISTORY:   Past Surgical History:  Procedure Laterality Date  . Nexplanon insertion    . WISDOM TOOTH EXTRACTION Bilateral     SOCIAL HISTORY:   Social History   Tobacco Use  . Smoking status: Former Smoker    Packs/day: 0.25    Types: Cigarettes    Quit date: 2021    Years since quitting: 1.2  . Smokeless tobacco: Never Used  Substance Use Topics  . Alcohol use: Not Currently    Comment: quit when she found out she was pregnant, was occassioanl    FAMILY HISTORY:  She denied any familial diseases.  DRUG ALLERGIES:   Allergies  Allergen Reactions  . Mango Flavor Hives and Swelling    Allergy to mangos    REVIEW OF SYSTEMS:   ROS As per history of present illness. All pertinent systems were reviewed above. Constitutional, HEENT, cardiovascular, respiratory, GI, GU, musculoskeletal, neuro, psychiatric, endocrine, integumentary and hematologic systems were reviewed and are otherwise negative/unremarkable except for positive findings mentioned above in the HPI.   MEDICATIONS AT HOME:   Prior to Admission medications   Medication Sig Start Date End Date Taking? Authorizing Provider  acetaminophen (TYLENOL) 500 MG tablet Take 2 tablets (1,000 mg total) by mouth every 6 (  six) hours as needed (for pain scale < 4). 02/08/21   McVey, Prudencio Pairebecca A, CNM  benzocaine-Menthol (DERMOPLAST) 20-0.5 % AERO Apply 1 application topically as needed for irritation (perineal discomfort). 02/08/21   McVey, Prudencio Pairebecca A, CNM  cetirizine (ZYRTEC) 10 MG tablet Take 1 tablet (10 mg total) by mouth daily. Patient not taking: No sig reported 12/27/17   Marylene LandKooistra, Kathryn Lorraine, CNM  coconut oil OIL Apply 1 application topically as needed. 02/08/21   McVey, Prudencio Pairebecca A, CNM   docusate sodium (COLACE) 100 MG capsule Take 1 capsule (100 mg total) by mouth 2 (two) times daily. 02/08/21   McVey, Prudencio Pairebecca A, CNM  ferrous sulfate 325 (65 FE) MG tablet Take 1 tablet (325 mg total) by mouth 2 (two) times daily with a meal. 02/08/21   McVey, Prudencio Pairebecca A, CNM  ibuprofen (ADVIL) 600 MG tablet Take 1 tablet (600 mg total) by mouth every 6 (six) hours. 02/08/21   McVey, Prudencio Pairebecca A, CNM  Prenatal Vit-Fe Fumarate-FA (MULTIVITAMIN-PRENATAL) 27-0.8 MG TABS tablet Take 2 tablets by mouth daily at 12 noon. 2 gummies per day Patient not taking: Reported on 02/06/2021    [provider]  sertraline (ZOLOFT) 25 MG tablet Take 25 mg by mouth daily.    [provider]  simethicone (MYLICON) 80 MG chewable tablet Chew 1 tablet (80 mg total) by mouth as needed for flatulence. 02/08/21   McVey, Prudencio Pairebecca A, CNM  witch hazel-glycerin (TUCKS) pad Apply 1 application topically continuous. 02/08/21   McVey, Prudencio Pairebecca A, CNM      VITAL SIGNS:  Blood pressure (!) 131/104, pulse 70, temperature 98.6 F (37 C), temperature source Oral, resp. rate 16, height 5\' 4"  (1.626 m), weight 61.2 kg, SpO2 100 %, unknown if currently breastfeeding.  PHYSICAL EXAMINATION:  Physical Exam  GENERAL:  25 y.o.-year-old African-American female patient lying in the bed with no acute distress.  EYES: Pupils equal, round, reactive to light and accommodation. No scleral icterus. Extraocular muscles intact.  HEENT: Head atraumatic, normocephalic. Oropharynx and nasopharynx clear.  NECK:  Supple, no jugular venous distention. No thyroid enlargement, no tenderness.  LUNGS: Normal breath sounds bilaterally, no wheezing, rales,rhonchi or crepitation. No use of accessory muscles of respiration.  CARDIOVASCULAR: Regular rate and rhythm, S1, S2 normal. No murmurs, rubs, or gallops.  ABDOMEN: Soft, nondistended, nontender. Bowel sounds present. No organomegaly or mass.  EXTREMITIES: No pedal edema, cyanosis, or clubbing.   NEUROLOGIC: Cranial nerves II through XII are intact except for right facial droop. Muscle strength 5/5 in all extremities except the left upper extremity that was 3-4/5 muscle strength.  She had mild left dysmetria on finger to finger and finger-to-nose test.  She had normal heel-to-shin test.  Sensation intact. Gait not checked.  PSYCHIATRIC: The patient is alert and oriented x 3.  Normal affect and good eye contact. SKIN: No obvious rash, lesion, or ulcer.   LABORATORY PANEL:   CBC Recent Labs  Lab 03/05/21 2040  WBC 4.4  HGB 13.0  HCT 41.6  PLT 212   ------------------------------------------------------------------------------------------------------------------  Chemistries  Recent Labs  Lab 03/05/21 2040  NA 138  K 3.8  CL 105  CO2 25  GLUCOSE 106*  BUN 8  CREATININE 0.65  CALCIUM 9.1  AST 20  ALT 22  ALKPHOS 79  BILITOT 0.8   ------------------------------------------------------------------------------------------------------------------  Cardiac Enzymes No results for input(s): TROPONINI in the last 168 hours. ------------------------------------------------------------------------------------------------------------------  RADIOLOGY:  CT Head Wo Contrast  Result Date: 03/05/2021 CLINICAL DATA:  One month postpartum.  Slurred speech, right arm weakness. EXAM: CT HEAD WITHOUT CONTRAST TECHNIQUE: Contiguous axial images were obtained from the base of the skull through the vertex without intravenous contrast. COMPARISON:  None. FINDINGS: Brain: No acute intracranial abnormality. Specifically, no hemorrhage, hydrocephalus, mass lesion, acute infarction, or significant intracranial injury. Vascular: No hyperdense vessel or unexpected calcification. Skull: No acute calvarial abnormality. Sinuses/Orbits: Visualized paranasal sinuses and mastoids clear. Orbital soft tissues unremarkable. Other: None IMPRESSION: Normal study. Electronically Signed   By: Charlett Nose M.D.    On: 03/05/2021 20:51   MR ANGIO HEAD WO CONTRAST  Result Date: 03/06/2021 CLINICAL DATA:  Unilateral weakness EXAM: MRA HEAD WITHOUT CONTRAST TECHNIQUE: Angiographic images of the Circle of Willis were obtained using MRA technique without intravenous contrast. COMPARISON:  None. FINDINGS: POSTERIOR CIRCULATION: --Vertebral arteries: Normal --Inferior cerebellar arteries: Normal. --Basilar artery: Normal. --Superior cerebellar arteries: Normal. --Posterior cerebral arteries: Normal. ANTERIOR CIRCULATION: --Intracranial internal carotid arteries: Normal. --Anterior cerebral arteries (ACA): Normal. --Middle cerebral arteries (MCA): Normal. ANATOMIC VARIANTS: Both P comms are present. IMPRESSION: Normal intracranial MRA. Electronically Signed   By: Deatra Robinson M.D.   On: 03/06/2021 00:10   MR Brain W and Wo Contrast  Result Date: 03/06/2021 CLINICAL DATA:  Slurred speech and facial numbness EXAM: MRI HEAD WITHOUT AND WITH CONTRAST TECHNIQUE: Multiplanar, multiecho pulse sequences of the brain and surrounding structures were obtained without and with intravenous contrast. CONTRAST:  7.71mL GADAVIST GADOBUTROL 1 MMOL/ML IV SOLN COMPARISON:  Head CT 03/05/2021 FINDINGS: Brain: There is a focus of abnormal diffusion restriction in the left lentiform nucleus adjacent to the posterior limb of the left internal capsule. No acute or chronic hemorrhage. Mild edema at the site of diffusion restriction but otherwise normal white matter signal, parenchymal volume and CSF spaces. The midline structures are normal. There is no abnormal contrast enhancement. Vascular: Major flow voids are preserved. Skull and upper cervical spine: Normal calvarium and skull base. Visualized upper cervical spine and soft tissues are normal. Sinuses/Orbits:No paranasal sinus fluid levels or advanced mucosal thickening. No mastoid or middle ear effusion. Normal orbits. IMPRESSION: Small acute infarct of the left lentiform nucleus adjacent to  the posterior limb of the left internal capsule. No hemorrhage or mass effect. Electronically Signed   By: Deatra Robinson M.D.   On: 03/06/2021 00:08   MR Cervical Spine W or Wo Contrast  Result Date: 03/06/2021 CLINICAL DATA:  Unilateral weakness and slurred speech EXAM: MRI CERVICAL SPINE WITHOUT AND WITH CONTRAST TECHNIQUE: Multiplanar and multiecho pulse sequences of the cervical spine, to include the craniocervical junction and cervicothoracic junction, were obtained without and with intravenous contrast. CONTRAST:  7.16mL GADAVIST GADOBUTROL 1 MMOL/ML IV SOLN COMPARISON:  None. FINDINGS: Alignment: Physiologic. Reversal of normal cervical lordosis may be positional or due to muscle spasm. Vertebrae: No fracture, evidence of discitis, or bone lesion. Cord: Normal signal and morphology. Posterior Fossa, vertebral arteries, paraspinal tissues: Negative. Disc levels: No spinal canal or neural foraminal stenosis. IMPRESSION: Reversal of normal cervical lordosis may be positional or due to muscle spasm. Otherwise normal MRI of the cervical spine. Electronically Signed   By: Deatra Robinson M.D.   On: 03/06/2021 00:09      IMPRESSION AND PLAN:  Active Problems:   Acute CVA (cerebrovascular accident) (HCC)  1.  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 - The patient will be admitted to a medical monitored bed. - We will follow neuro checks every  4 hours for 24 hours. - She will be placed on aspirin and statin therapy. - We will check her fasting lipids in a.m. - We will check her hemoglobin A1c. - We will obtain a 2D echo with bubble study in a.m. - PT/OT and ST consults will be obtained. - Neurology consult will be obtained. - I notified Dr. Derry Lory about the patient  2.  Slurred speech/dysarthria, expressive dysphasia, left upper extremity weakness and right facial droop. - The patient will be evaluated by speech  therapy, PT and OT. - Swallowing evaluation will be done.  3.  Depression. - We will continue Zoloft.  4.  Anemia. - We will continue ferrous sulfate.  DVT prophylaxis: Lovenox. Code Status: full code. Family Communication:  The plan of care was discussed in details with the patient (and family). I answered all questions. The patient agreed to proceed with the above mentioned plan. Further management will depend upon hospital course. Disposition Plan: Back to previous home environment Consults called: Neurology consult.   All the records are reviewed and case discussed with ED provider.  Status is: Inpatient  Remains inpatient appropriate because:Ongoing diagnostic testing needed not appropriate for outpatient work up, Unsafe d/c plan, IV treatments appropriate due to intensity of illness or inability to take PO and Inpatient level of care appropriate due to severity of illness   Dispo: The patient is from: Home              Anticipated d/c is to: Home              Patient currently is not medically stable to d/c.   Difficult to place patient No   TOTAL TIME TAKING CARE OF THIS PATIENT: 55 minutes.    Hannah Beat M.D on 03/06/2021 at 1:26 AM  [facility]   From 7 PM-7 AM, contact night-coverage [email or web address]  CC: Primary care physician; Department, [facility]

## 2021-03-06 NOTE — Evaluation (Signed)
Occupational Therapy Evaluation Patient Details Name: Andrea Weiss MRN: [id number] DOB: 1996-06-30 Today's Date: 03/06/2021    History of Present Illness Pt is a 25 y/o F admitted from home on 03/05/21 with c/c of R UE weakness & pt endorses dysarthria, expressive dysphasia, & HA. Pt is 2 weeks postpartum. 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 PMH: anxiety, depression, anemia, abdominal hernia   Clinical Impression   Pt seen for OT evaluation this date in setting of acute hospitalization d/t CVA. Pt reports being completely INDEP at baseline including caring for two children. Pt presents this date with decreased L peripheral vision, decreased R UE and R LE strength as well as decreased R fine motor coordination impacting her ability to safely and efficiently perform ADLs/ADL mobility. Pt requires CGA/SUPV with ADL mobility and increased time to get into/out of bed d/t R leg somewhat lagging. In addition, pt with difficulty managing fine motor manipulatives in R hand such as coins or condiments. Pt requires MIN A for ADL tasks that require [facility] at this time. Anticipate that pt will require continued skilled OT in acute setting as well as f/u in acute rehab setting to maximize restoration of strength and functional performance.     Follow Up Recommendations  CIR    Equipment Recommendations  Other (comment) (defer)    Recommendations for Other Services       Precautions / Restrictions Precautions Precautions: Fall Restrictions Weight Bearing Restrictions: No      Mobility Bed Mobility Overal bed mobility: Needs Assistance Bed Mobility: Supine to Sit;Sit to Supine     Supine to sit: Modified independent (Device/Increase time);HOB elevated Sit to supine: Supervision;HOB elevated   General bed mobility comments: extra time & effort to elevate RLE onto bed     Transfers Overall transfer level: Needs assistance     Sit to Stand: Supervision              Balance Overall balance assessment: Needs assistance Sitting-balance support: Feet supported;No upper extremity supported Sitting balance-Leahy Scale: Good Sitting balance - Comments: supervision sitting EOB   Standing balance support: No upper extremity supported;During functional activity Standing balance-Leahy Scale: Fair Standing balance comment: unable to accept challenge, not AD required                           ADL either performed or assessed with clinical judgement   ADL Overall ADL's : Needs assistance/impaired                                       General ADL Comments: able to do most basic aspects of self care at MOD I level at this time (requiring increased time versus her baseline), but currently requiring MIN A for fine motor coordination tasks such as opening condiments     Vision Patient Visual Report: Blurring of vision;Peripheral vision impairment (decreased peripheral vision to her left noted on OT assessment.) Vision Assessment?: Yes Eye Alignment: Within Functional Limits Ocular Range of Motion: Within Functional Limits     Perception     Praxis      Pertinent Vitals/Pain Pain Assessment: No/denies pain     Hand Dominance Right   Extremity/Trunk Assessment Upper Extremity Assessment Upper Extremity Assessment: RUE deficits/detail;LUE deficits/detail RUE Deficits /  Details: slower with rapid alternating movements test than L. Grip grossly ~4/5 RUE Coordination: decreased fine motor;decreased gross motor LUE Deficits / Details: WFL   Lower Extremity Assessment Lower Extremity Assessment: Defer to PT evaluation (noted R LE weakness with LB dressing tasks on OT assessment (somewhat of a lag, but pt able to eventially obtain the R LE for dressing tasks))       Communication Communication Communication: Expressive  difficulties   Cognition Arousal/Alertness: Awake/alert Behavior During Therapy: WFL for tasks assessed/performed Overall Cognitive Status: Within Functional Limits for tasks assessed                                 General Comments: AxO x 4   General Comments       Exercises Other Exercises Other Exercises: OT facilitates ed re: role of OT in acute setting, and FMC exercises including abd/add, flex/ext digits, oppose to thumb. Pt with good comprehension demonstrated.   Shoulder Instructions      Home Living Family/patient expects to be discharged to:: Private residence Living Arrangements: Children;Spouse/significant other (3y/o and 1 m/o)   Type of Home: House Home Access: Stairs to enter [employer] of Steps: 2 Entrance Stairs-Rails: None Home Layout: One level               Home Equipment: None      Lives With: Significant other;Son;Daughter (fiance)    Prior Functioning/Environment Level of Independence: Independent        Comments: caring for 25 y/o and 26 month old        OT Problem List: Impaired vision/perception;Decreased coordination;Impaired balance (sitting and/or standing)      OT Treatment/Interventions: Self-care/ADL training;Neuromuscular education;Therapeutic activities;Therapeutic exercise    OT Goals(Current goals can be found in the care plan section) Acute Rehab OT Goals Patient Stated Goal: get better OT Goal Formulation: With patient Time For Goal Achievement: 03/20/21 Potential to Achieve Goals: Good ADL Goals Additional ADL Goal #1: Pt will demonstrate 80% success with managing practical fine motor manipulatives (such as coins with only 20% dropping ex: 2/10) Additional ADL Goal #2: Pt will button 6/8 snaps on hospital gown Additional ADL Goal #3: Pt will send 3 sentence text message with <3 errors in one minute  OT Frequency: Min 1X/week   Barriers to D/C: Decreased caregiver support           Co-evaluation              AM-PAC OT "6 Clicks" Daily Activity     Outcome Measure Help from another person eating meals?: A Little Help from another person taking care of personal grooming?: A Little Help from another person toileting, which includes using toliet, bedpan, or urinal?: None Help from another person bathing (including washing, rinsing, drying)?: None Help from another person to put on and taking off regular upper body clothing?: A Little Help from another person to put on and taking off regular lower body clothing?: A Little 6 Click Score: 20   End of Session Nurse Communication: Mobility status;Other (comment) (vision and need for lac consultant)  Activity Tolerance: Patient tolerated treatment well Patient left: in bed  OT Visit Diagnosis: Unsteadiness on feet (R26.81);Muscle weakness (generalized) (M62.81)                Time: 8280-0349 OT Time Calculation (min): 20 min Charges:  OT General Charges $OT Visit: 1 Visit OT Evaluation $OT Eval Low Complexity:  1 Low OT Treatments $Therapeutic Activity: 8-22 mins  Rejeana Brock, MS, OTR/L ascom [phone] 03/06/21, 6:08 PM

## 2021-03-06 NOTE — Evaluation (Signed)
Speech Language Pathology Evaluation Patient Details Name: Andrea Weiss MRN: [id number] DOB: [birth year] Today's Date: 03/06/2021 Time: 0762-2633 SLP Time Calculation (min) (ACUTE ONLY): 60 min  Problem List:  Patient Active Problem List   Diagnosis Date Noted  . Acute CVA (cerebrovascular accident) (HCC) 03/06/2021  . Encounter for elective induction of labor 02/06/2021  . Marijuana smoker 02/05/2021  . Encounter for supervision of other normal pregnancy, third trimester 07/21/2020   Past Medical History:  Past Medical History:  Diagnosis Date  . Abdominal hernia   . Anemia   . Anxiety   . Depression   . History of anemia   . History of pica   . History of urinary tract infection   . Medical history non-contributory    Past Surgical History:  Past Surgical History:  Procedure Laterality Date  . Nexplanon insertion    . WISDOM TOOTH EXTRACTION Bilateral    HPI:  Pt is a 25 y.o. female with medical history significant for anxiety and depression, anemia who is 2-week postpartum, and presents to the emergency room with acute onset of Right facial droop and Right upper extremity weakness without paresthesias; leaning to Right side when walking she stated.  She admits to dysarthria and difficulty w/ speech.  She has been having headache without dizziness or blurred vision or diplopia.  No vertigo or tinnitus.  No urinary or stool incontinence.  No witnessed seizures.  No dysphagia.  The patient denied any chest pain or palpitations, cough or wheezing or dyspnea or hemoptysis.  No other bleeding diathesis.  She is breastfeeding her infant(~66-48 weeks old).  MRI: Small acute infarct of the left lentiform nucleus adjacent to the  posterior limb of the left internal capsule. No hemorrhage or mass  effect but mild edema at site.   Assessment / Plan / Recommendation Clinical Impression  Pt presents with moderate Dysarthria that is c/b imprecise articulation impacting intelligibility of  speech and verbal communication in conversation w/ others in ADLs. Pt's oral motor abilities are moderately impaired as the right side of his face and tongue are weak; lingual deviation. Pt's speech intelligibility is c/b imprecise articulation at the word-conversation levels. During OM exam, pt presented w/ Right lingual and labial motor weakness during tasks of lingual protrusion(deviated) and lateral reach/strength. Decreased Right labial tone and ROM noted.  Education provided on speech intelligibility strategies (Slow Rate, Increased Loudness, Overarticulate). Practice of articulation strategies was completed w/ pt -- focused on over-articulation w/ Big, Loud speech; identified need to replenish breath support to complete the word or sentence for articulation and clarity. Pt tended to speak more min softly, but noted when she emphasized words w/ increased volume and effort, intelligibility greatly improved. No apparent expressive or receptive aphasia noted, no cognitive deficits noted -- pt A/O x4 w/ appropriate follow through w/ instructions and tasks.   Pt demonstrated understanding and good follow through w/ using the strategies which appeared to increase pt's speech intelligibility at word-conversation levels. Giving the findings of this evaluation, skilled ST services are recommended to further assess and tx pt's Dysarthria in order to increase pt's functional speech intelligibility to return to family/work as well as improve functional independence in ADLs. All Handouts given on Articulatory strategies; speech tasks/exercises; information on Dysarthria.  Recommend f/u w/ ST services that will be supportive of pt and infant/family as pt is a new, breastfeeding Mother. Obtained pump, parts for pumping in room and discussed storage of breast milk w/ NSG, pt.   SLP Assessment  SLP Recommendation/Assessment: All further Speech Lanaguage Pathology  needs can be addressed in the next venue of care SLP  Visit Diagnosis: Dysarthria and anarthria (R47.1)    Follow Up Recommendations  Inpatient Rehab (vs Home Health to reduce burden and stress)    Frequency and Duration  (n/a)   (n/a)      SLP Evaluation Cognition  Overall Cognitive Status: Within Functional Limits for tasks assessed Arousal/Alertness: Awake/alert Orientation Level: Oriented X4 Attention: Focused;Sustained Focused Attention: Appears intact Sustained Attention: Appears intact Memory: Appears intact (events this morning) Awareness: Appears intact Problem Solving: Appears intact Behaviors:  (none) Safety/Judgment: Appears intact Comments: asked good questions about pumping breast milk, storage       Comprehension  Auditory Comprehension Overall Auditory Comprehension: Appears within functional limits for tasks assessed Yes/No Questions: Within Functional Limits Commands: Within Functional Limits Conversation: Complex Other Conversation Comments: Dyarsthria Visual Recognition/Discrimination Discrimination: Not tested Reading Comprehension Reading Status: Within funtional limits    Expression Expression Primary Mode of Expression: Verbal Verbal Expression Overall Verbal Expression: Appears within functional limits for tasks assessed Initiation: No impairment Automatic Speech: Name;Social Response;Counting;Day of week Level of Generative/Spontaneous Verbalization: Conversation Repetition: No impairment Naming: No impairment Pragmatics: No impairment Interfering Components: Speech intelligibility (Dysarthria) Effective Techniques: Articulatory cues Non-Verbal Means of Communication: Not applicable Other Verbal Expression Comments: utilized strategies Written Expression Dominant Hand: Right Written Expression: Not tested   Oral / Motor  Oral Motor/Sensory Function Overall Oral Motor/Sensory Function: Moderate impairment Facial ROM: Reduced right;Suspected CN VII (facial) dysfunction Facial Symmetry:  Abnormal symmetry right;Suspected CN VII (facial) dysfunction Facial Strength: Reduced right;Suspected CN VII (facial) dysfunction Facial Sensation: Within Functional Limits Lingual ROM: Reduced right;Suspected CN XII (hypoglossal) dysfunction Lingual Symmetry: Abnormal symmetry right;Suspected CN XII (hypoglossal) dysfunction (R deviation) Lingual Strength: Reduced;Suspected CN XII (hypoglossal) dysfunction (mild+) Lingual Sensation: Within Functional Limits Mandible: Within Functional Limits Motor Speech Overall Motor Speech: Impaired Respiration: Within functional limits Phonation: Normal Resonance: Within functional limits Articulation: Impaired Level of Impairment: Word Intelligibility: Intelligibility reduced Word: 50-74% accurate Phrase: 50-74% accurate Sentence: 50-74% accurate Conversation: 50-74% accurate Motor Planning: Impaired Motor Speech Errors: Aware Interfering Components:  (n/a) Effective Techniques: Slow rate;Increased vocal intensity;Over-articulate   GO                      Jerilynn Som, MS, CCC-SLP Speech Language Pathologist Rehab Services [phone] [facility] 03/06/2021, 11:28 AM

## 2021-03-06 NOTE — ED Notes (Signed)
Discussed admission concerns with pt regarding being new mother and currently breastfeeding. Discussed with ED charge RN who states pt should plan on not being able to keep child in hospital for feedings. Pt and pt's fiance verbalized understanding, pt's fiance said he is making arrangements for the baby and states baby will be able to be well taken care of during pt's admission. Pt tearful but agreeable to plan.

## 2021-03-07 DIAGNOSIS — I639 Cerebral infarction, unspecified: Secondary | ICD-10-CM

## 2021-03-07 LAB — CBC
HCT: 40.6 % (ref 36.0–46.0)
Hemoglobin: 12.5 g/dL (ref 12.0–15.0)
MCH: 25.2 pg — ABNORMAL LOW (ref 26.0–34.0)
MCHC: 30.8 g/dL (ref 30.0–36.0)
MCV: 81.9 fL (ref 80.0–100.0)
Platelets: 179 10*3/uL (ref 150–400)
RBC: 4.96 MIL/uL (ref 3.87–5.11)
RDW: 14.6 % (ref 11.5–15.5)
WBC: 3.6 10*3/uL — ABNORMAL LOW (ref 4.0–10.5)
nRBC: 0 % (ref 0.0–0.2)

## 2021-03-07 LAB — BASIC METABOLIC PANEL
Anion gap: 10 (ref 5–15)
BUN: 9 mg/dL (ref 6–20)
CO2: 25 mmol/L (ref 22–32)
Calcium: 9.2 mg/dL (ref 8.9–10.3)
Chloride: 105 mmol/L (ref 98–111)
Creatinine, Ser: 0.74 mg/dL (ref 0.44–1.00)
GFR, Estimated: >60 mL/min (ref >60)
Glucose, Bld: 81 mg/dL (ref 70–99)
Potassium: 4.5 mmol/L (ref 3.5–5.1)
Sodium: 140 mmol/L (ref 135–145)

## 2021-03-07 LAB — ANTITHROMBIN III: AntiThromb III Func: 109 % (ref 75–120)

## 2021-03-07 LAB — MAGNESIUM: Magnesium: 1.9 mg/dL (ref 1.7–2.4)

## 2021-03-07 LAB — PHOSPHORUS: Phosphorus: 5.4 mg/dL — ABNORMAL HIGH (ref 2.5–4.6)

## 2021-03-07 MED ORDER — CYANOCOBALAMIN 1000 MCG/ML IJ SOLN
1000.0000 ug | Freq: Every day | INTRAMUSCULAR | Status: DC
  Administered 2021-03-07 – 2021-03-08 (×2): 1000 ug via INTRAMUSCULAR
  Filled 2021-03-07 (×2): qty 1

## 2021-03-07 MED ORDER — VITAMIN D (ERGOCALCIFEROL) 1.25 MG (50000 UNIT) PO CAPS
50000.0000 [IU] | ORAL_CAPSULE | ORAL | Status: DC
  Administered 2021-03-07: 50000 [IU] via ORAL
  Filled 2021-03-07: qty 1

## 2021-03-07 NOTE — Progress Notes (Signed)
Inpatient Rehab Admissions Coordinator:   PT and OT worked with Pt. This AM and report improved function. They are now recommending HHPT/OT. I confirmed with Pt. And family that they wish for Pt. To continue rehab at home. CIR will sign off at this time. Please contact me with any questions.  Megan Salon, MS, CCC-SLP Rehab Admissions Coordinator  [phone] (celll) [phone] (office)

## 2021-03-07 NOTE — Progress Notes (Signed)
NEUROLOGY CONSULTATION PROGRESS NOTE   Date of service: March 07, 2021 Patient Name: Andrea Weiss MRN:  [id number] DOB:  11/16/1996  Brief HPI   Andrea Weiss is a 25 y.o. 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 Her neurologic examination is notable for mild R facial asymmetry, mild RUE and RLE weakness notable only on detailed neuro exam. NIHSS is a 1.   Interval Hx   Reports that the slowed speech is improving and now can talk much better. R facial asymmetry is improved significantly. Has been doing better with her RUE and has not noticed any issues when texting.  Vitals   Vitals:   03/06/21 2111 03/07/21 0018 03/07/21 0507 03/07/21 0749  BP: (!) 144/97 110/69 103/68 120/82  Pulse: (!) 59 63 79 90  Resp: 16 16 16 16   Temp: 98.3 F (36.8 C) 98.6 F (37 C) 98.4 F (36.9 C) 97.7 F (36.5 C)  TempSrc:      SpO2: 99% 98% 99% 97%  Weight:      Height:         Body mass index is 23.17 kg/m.  Physical Exam   General: Laying comfortably in bed; in no acute distress.  HENT: Normal oropharynx and mucosa. Normal external appearance of ears and nose.  Neck: Supple, no pain or tenderness  CV: No JVD. No peripheral edema.  Pulmonary: Symmetric Chest rise. Normal respiratory effort.  Abdomen: Soft to touch, non-tender.  Ext: No cyanosis, edema, or deformity  Skin: No rash. Normal palpation of skin.   Musculoskeletal: Normal digits and nails by inspection. No clubbing.   Neurologic Examination  Mental status/Cognition: Alert, oriented to self, place, month and year, good attention.  Speech/language: Fluent, comprehension intact, object naming intact, repetition intact.  Cranial  nerves:   CN II Pupils equal and reactive to light, no VF deficits    CN III,IV,VI EOM intact, no gaze preference or deviation, no nystagmus   CN V normal sensation in V1, V2, and V3 segments bilaterally    CN VII no asymmetry, no nasolabial fold flattening    CN VIII normal hearing to speech    CN IX & X normal palatal elevation, no uvular deviation    CN XI 5/5 head turn and 5/5 shoulder shrug bilaterally    CN XII midline tongue protrusion    Motor:  Muscle bulk: normal, tone normal Mvmt Root Nerve  Muscle Right Left Comments  SA C5/6 Ax Deltoid 5 5   EF C5/6 Mc Biceps 5 5   EE C6/7/8 Rad Triceps 5 5   WF C6/7 Med FCR     WE C7/8 PIN ECU     F Ab C8/T1 U ADM/FDI 5 5   HF L1/2/3 Fem Illopsoas 5 5   KE L2/3/4 Fem Quad     DF L4/5 D Peron Tib Ant 5 5   PF S1/2 Tibial Grc/Sol 5 5    Sensation:  Light touch intact   Pin prick    Temperature    Vibration   Proprioception    Coordination/Complex Motor:  - Finger to Nose intact - Rapid alternating movement are normal - Gait: deferred.  Labs   Basic Metabolic Panel:  Lab Results  Component Value Date   NA 140 03/07/2021   K 4.5 03/07/2021   CO2 25 03/07/2021   GLUCOSE 81 03/07/2021   BUN 9 03/07/2021   CREATININE 0.74 03/07/2021   CALCIUM 9.2 03/07/2021   GFRNONAA >60 03/07/2021   GFRAA >60 03/28/2020   HbA1c: No results found for: HGBA1C LDL:  Lab Results  Component Value Date   LDLCALC 76 03/06/2021   Urine Drug Screen:     Component Value Date/Time   LABOPIA NONE DETECTED 03/05/2021 2230   COCAINSCRNUR NONE DETECTED 03/05/2021 2230   LABBENZ NONE DETECTED 03/05/2021 2230   AMPHETMU NONE DETECTED 03/05/2021 2230   THCU POSITIVE (A) 03/05/2021 2230   LABBARB NONE DETECTED 03/05/2021 2230    Alcohol Level No results found for: ETH No results found for: PHENYTOIN, ZONISAMIDE, LAMOTRIGINE, LEVETIRACETA No results found for: PHENYTOIN, PHENOBARB, VALPROATE, CBMZ  Imaging and Diagnostic studies   CT Head  without contrast: CTH was negative for a large hypodensity concerning for a large territory infarct or hyperdensity concerning for an ICH  MRI Brain  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  MR Angio head w/o contrast: Pending  US Carotid duplex: No evidence of significant internal carotid artery stenosis.  [place] BL Lower ext: No left lower extremity DVT  TTE: Pending.  Impression   Andrea Weiss is a 25 y.o. 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 Neuro exam is improved with no facial asymmetry noted today, speech is improved per patient and can talk much faster. Improved dexterity in RUE.  Workup so far with no significant stenosis or LVO. TTE is pending. [place] lower ext is negative for a DVT. I did not get an MRV but on evaluation of MRI with contrast, I do not see any sinus venous thrombosis.  Will get workup with hypercoagulable labs. I suspect that this is likely due yo hypercoagulable state from recent pregnancy. Also discussed with stroke team and they agree. However, the appearance of the infarct, specially its shape is somewhat atypical. TTE is pending, will see if it is concerning for potential takotsubo cardiomyopathy.  Recommendations  - F/up TTE, if negative will plan on TEE. - Continue Aspirin 81mg  daily - Initially planned for atorvastatin but given its secretion in brest milk and that patient is breast feeding, will hold off on it. Her LDL is 76 and not that significantly elevated. - Pt is being followed by PT/OT ans SLP. - Urine tox screen positive for cannabinoids. - Counseled on the importance of  quitting smoking and Marijuana - continue Cardiac monitoring.  ______________________________________________________________________   Thank you for the opportunity to take part in the care of this patient. If you have any further questions, please contact the neurology consultation attending.  Signed,  [facility] Pager Number Erick Blinks

## 2021-03-07 NOTE — Progress Notes (Signed)
Physical Therapy Treatment Patient Details Name: Andrea Weiss MRN: [id number] DOB: [birth year] Today's Date: 03/07/2021    History of Present Illness Pt is a 25 y/o F admitted from home on 03/05/21 with c/c of R UE weakness & pt endorses dysarthria, expressive dysphasia, & HA. Pt is 2 weeks postpartum. 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 PMH: anxiety, depression, anemia, abdominal hernia    PT Comments    Pt seen for PT tx with pt demonstrating improved speech & reporting improving use of RUE. PT educates pt on what a stroke is & stroke recovery. Pt is able to ambulate 3 laps without AD initially with CGA fade to close supervision; pt with 2 LOB initially but then without any as gait progressed. Pt negotiates 5 steps x 2 trials without rails but UE support with PT providing education re: compensatory pattern. Discussed improvements with pt & new recommendation of HHPT f/u with future transition to OPPT if needed. Also educated pt on home modifications for increased safety (remove throw rugs/toys, make sure toddler isn't underfoot). Also educated pt she needs supervision for OOB mobility and should not carry her infant when walking at this time, until her balance & safety with mobility improves - pt voices understanding & father present in room as well. Pt would benefit from ongoing PT services to address high level balance, stair negotiation without rails, and RUE/RLE NMR.     Follow Up Recommendations  Home health PT;Supervision for mobility/OOB     Equipment Recommendations  Rolling walker with 5" wheels    Recommendations for Other Services       Precautions / Restrictions Precautions Precautions: Fall Restrictions Weight Bearing Restrictions: No    Mobility  Bed Mobility Overal bed mobility: Needs Assistance Bed Mobility: Supine to Sit     Supine to sit: Modified  independent (Device/Increase time);HOB elevated          Transfers Overall transfer level: Needs assistance   Transfers: Sit to/from Stand Sit to Stand: Modified independent (Device/Increase time)            Ambulation/Gait Ambulation/Gait assistance: Min guard;Supervision Gait Distance (Feet): 550 Feet Assistive device: None Gait Pattern/deviations: Decreased weight shift to left;Decreased step length - right;Decreased dorsiflexion - right     General Gait Details: initially 2 LOB to R but then no LOB noted, pt progressed from CGA to close supervision, pt progressing to more reciprocal arm swing   Stairs Stairs: Yes Stairs assistance: Min assist Stair Management: No rails Number of Stairs: 5 (x 2 trials) General stair comments: cuing for compensatory pattern, first trial with RUE HHA for ascending & descending, 2nd trial without UE support when ascending but 1UE support when descending   Wheelchair Mobility    Modified Rankin (Stroke Patients Only)       Balance Overall balance assessment: Needs assistance Sitting-balance support: Feet supported;No upper extremity supported Sitting balance-Leahy Scale: Good Sitting balance - Comments: supervision sitting EOB   Standing balance support: No upper extremity supported;During functional activity Standing balance-Leahy Scale: Fair                              Cognition Arousal/Alertness: Awake/alert Behavior During Therapy: WFL for tasks assessed/performed Overall Cognitive Status: Within Functional Limits for tasks assessed  General Comments: AxO x 4, very pleasant & receptive of all education      Exercises      General Comments General comments (skin integrity, edema, etc.): HR up to 122 bpm with gait      Pertinent Vitals/Pain Pain Assessment: No/denies pain    Home Living                      Prior Function            PT  Goals (current goals can now be found in the care plan section) Acute Rehab PT Goals Patient Stated Goal: get better PT Goal Formulation: With patient Time For Goal Achievement: 03/20/21 Potential to Achieve Goals: Good Progress towards PT goals: Progressing toward goals    Frequency    7X/week      PT Plan Discharge plan needs to be updated    Co-evaluation              AM-PAC PT "6 Clicks" Mobility   Outcome Measure  Help needed turning from your back to your side while in a flat bed without using bedrails?: None Help needed moving from lying on your back to sitting on the side of a flat bed without using bedrails?: None Help needed moving to and from a bed to a chair (including a wheelchair)?: A Little Help needed standing up from a chair using your arms (e.g., wheelchair or bedside chair)?: None Help needed to walk in hospital room?: A Little Help needed climbing 3-5 steps with a railing? : A Little 6 Click Score: 21    End of Session Equipment Utilized During Treatment: Gait belt Activity Tolerance: Patient tolerated treatment well Patient left: in bed;with family/visitor present;with call bell/phone within reach (sitting EOB)   PT Visit Diagnosis: Unsteadiness on feet (R26.81);Difficulty in walking, not elsewhere classified (R26.2)     Time: 3762-8315 PT Time Calculation (min) (ACUTE ONLY): 20 min  Charges:  $Therapeutic Activity: 8-22 mins                     Aleda Grana, PT, DPT 03/07/21, 12:14 PM    Sandi Mariscal 03/07/2021, 12:09 PM

## 2021-03-07 NOTE — Progress Notes (Addendum)
[facility] Progress Note  Patient: Andrea Weiss    IRJ:[id number]  DOA: 03/05/2021     Date of Service: the patient was seen and examined on 03/07/2021  Chief Complaint  Patient presents with  . Numbness   Brief hospital course: Andrea Weiss is a 25 y.o. female with medical history significant for anxiety and depression, anemia who is 2-week postpartum, and presents to the emergency room with acute onset of right facial droop and  right hand weakness, slurred speech, dysarthria and expressive dysphasia.  Patient denied any other focal deficits and no any other complaints. ED work-up: MRI brain shows small acute infarct of the left anterior forearm nucleus adjacent to the posterior limb of the left internal capsule with no hemorrhage or mass.  MRA negative.  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 Continue neuro checks every 4 hours for 24 hours. - Started  Aspirin, LDL 76 not very high so discontinued Lipitor as patient is breast-feeding, which is a contraindication.  Neurologist agreed -f/u globin A1c and TSH level Carotid Doppler did not show significant stenosis and lower extremity venous duplex negative for DVT - PT/OT and ST consulted, patient will need outpatient PT and OT and speech therapy. - Neurology consulted, Dr. Derry Lory a was notified by admitting physician  F/u autoimmune and hypercoagulable work-up ordered by neurology  -F/u 2D echo with bubble study, if negative then patient will need TEE   # Depression. - We will continue Zoloft.  # Anemia. - We will continue ferrous sulfate.  #Vitamin B12 level 260, target >400, started vitamin B-12 1000 mcg IM injection during hospital stay, start oral supplement on  discharge.  #Vitamin D deficiency, started vitamin D 50k units p.o. weekly for total 8 weeks, follow with PCP to repeat vitamin D level after 3 to 6 months   Body mass index is 23.17 kg/m.   Interventions:        Diet: Heart healthy DVT Prophylaxis: Subcutaneous Lovenox   Advance goals of care discussion: Full code  Family Communication: family was Not present at bedside, at the time of interview.  The pt provided permission to discuss medical plan with the family. Opportunity was given to ask question and all questions were answered satisfactorily.   Disposition:  Pt is from Home, admitted with acute CVA, still has slurred speech and right-sided weakness, stroke work-up pending, which precludes a safe discharge. Discharge to home most likely in 1 to 2 days.  Subjective: No significant overnight issues, patient feels improvement in her symptoms of aphasia and right hand weakness. Denies any other active issues.    Physical Exam: General:  alert oriented to time, place, and person.  Appear in mild distress, affect appropriate Eyes: PERRLA ENT: Oral Mucosa Clear, moist  Neck: no JVD,  Cardiovascular: S1 and S2 Present, no Murmur,  Respiratory: good respiratory effort, Bilateral Air entry equal and Decreased, no Crackles, no wheezes Abdomen: Bowel Sound present, Soft and no tenderness,  Skin: no rashes Extremities: no Pedal edema, n calf tenderness Neurologic: Right-sided facial droop and right hand weakness improving, no any other focal deficits. Gait not checked due to patient safety concerns  Vitals:   03/07/21 0018 03/07/21 0507 03/07/21 0749 03/07/21 1216  BP: 110/69 103/68 120/82 (!) 135/91  Pulse: 63  79 90 80  Resp: 16 16 16 20   Temp: 98.6 F (37 C) 98.4 F (36.9 C) 97.7 F (36.5 C) 97.7 F (36.5 C)  TempSrc:      SpO2: 98% 99% 97% 99%  Weight:      Height:        Intake/Output Summary (Last 24 hours) at 03/07/2021 1434 Last data filed at 03/06/2021  2040 Gross per 24 hour  Intake 120 ml  Output --  Net 120 ml   Filed Weights   03/05/21 2011  Weight: 61.2 kg    Data Reviewed: I have personally reviewed and interpreted daily labs, tele strips, imagings as discussed above. I reviewed all nursing notes, pharmacy notes, vitals, pertinent old records I have discussed plan of care as described above with RN and patient/family.  CBC: Recent Labs  Lab 03/05/21 2040 03/06/21 0606 03/07/21 0403  WBC 4.4 5.1 3.6*  NEUTROABS 1.9  --   --   HGB 13.0 12.4 12.5  HCT 41.6 39.6 40.6  MCV 81.4 80.5 81.9  PLT 212 195 179   Basic Metabolic Panel: Recent Labs  Lab 03/05/21 2040 03/06/21 0606 03/07/21 0403  NA 138 139 140  K 3.8 3.9 4.5  CL 105 108 105  CO2 25 24 25   GLUCOSE 106* 94 81  BUN 8 10 9   CREATININE 0.65 0.59 0.74  CALCIUM 9.1 8.7* 9.2  MG  --   --  1.9  PHOS  --   --  5.4*    Studies: No results found.  Scheduled Meds: . aspirin  300 mg Rectal Daily   Or  . aspirin EC  325 mg Oral Daily  . cyanocobalamin  1,000 mcg Intramuscular Daily  . docusate sodium  100 mg Oral BID  . enoxaparin (LOVENOX) injection  40 mg Subcutaneous Q24H  . ferrous sulfate  325 mg Oral BID WC  . LORazepam  0.5 mg Intravenous Once  . LORazepam  1 mg Intravenous Once  . sertraline  25 mg Oral Daily  . Vitamin D (Ergocalciferol)  50,000 Units Oral Q7 days   Continuous Infusions:  PRN Meds: acetaminophen **OR** acetaminophen, magnesium hydroxide, ondansetron **OR** ondansetron (ZOFRAN) IV, simethicone, traZODone  Time spent: 35 minutes  Author: 03/09/21. MD [facility] 03/07/2021 2:34 PM  To reach On-call, see care teams to locate the attending and reach out to them via www. . If 7PM-7AM, please contact night-coverage If you still have difficulty reaching the attending provider, please page the [place] (Director on Call) for [facility] on amion for assistance.

## 2021-03-08 ENCOUNTER — Inpatient Hospital Stay
Admit: 2021-03-08 | Discharge: 2021-03-08 | Disposition: A | Discharge status: Home / Self Care | Payer: Medicaid Other | Attending: Student | Admitting: Student

## 2021-03-08 LAB — CBC
HCT: 41.3 % (ref 36.0–46.0)
Hemoglobin: 13.4 g/dL (ref 12.0–15.0)
MCH: 26 pg (ref 26.0–34.0)
MCHC: 32.4 g/dL (ref 30.0–36.0)
MCV: 80 fL (ref 80.0–100.0)
Platelets: 193 10*3/uL (ref 150–400)
RBC: 5.16 MIL/uL — ABNORMAL HIGH (ref 3.87–5.11)
RDW: 14.6 % (ref 11.5–15.5)
WBC: 4.5 10*3/uL (ref 4.0–10.5)
nRBC: 0 % (ref 0.0–0.2)

## 2021-03-08 LAB — ECHOCARDIOGRAM COMPLETE BUBBLE STUDY
AR max vel: 1.85 cm2
AV Area VTI: 1.83 cm2
AV Area mean vel: 1.97 cm2
AV Mean grad: 3 mmHg
AV Peak grad: 5.9 mmHg
Ao pk vel: 1.21 m/s
Area-P 1/2: 3.6 cm2
MV VTI: 2.61 cm2
S' Lateral: 2.8 cm

## 2021-03-08 LAB — BASIC METABOLIC PANEL
Anion gap: 10 (ref 5–15)
BUN: 8 mg/dL (ref 6–20)
CO2: 24 mmol/L (ref 22–32)
Calcium: 9.2 mg/dL (ref 8.9–10.3)
Chloride: 102 mmol/L (ref 98–111)
Creatinine, Ser: 0.64 mg/dL (ref 0.44–1.00)
GFR, Estimated: >60 mL/min (ref >60)
Glucose, Bld: 83 mg/dL (ref 70–99)
Potassium: 3.8 mmol/L (ref 3.5–5.1)
Sodium: 136 mmol/L (ref 135–145)

## 2021-03-08 LAB — MAGNESIUM: Magnesium: 2 mg/dL (ref 1.7–2.4)

## 2021-03-08 LAB — ANTI-DNA ANTIBODY, DOUBLE-STRANDED: ds DNA Ab: 1 IU/mL (ref 0–9)

## 2021-03-08 LAB — HEMOGLOBIN A1C
Hgb A1c MFr Bld: 5.4 % (ref 4.8–5.6)
Mean Plasma Glucose: 108 mg/dL

## 2021-03-08 LAB — ANA W/REFLEX IF POSITIVE: Anti Nuclear Antibody (ANA): NEGATIVE

## 2021-03-08 LAB — PHOSPHORUS: Phosphorus: 5.3 mg/dL — ABNORMAL HIGH (ref 2.5–4.6)

## 2021-03-08 LAB — PROTEIN C ACTIVITY: Protein C Activity: 110 % (ref 73–180)

## 2021-03-08 LAB — PROTEIN S ACTIVITY: Protein S Activity: 70 % (ref 63–140)

## 2021-03-08 MED ORDER — ASPIRIN EC 81 MG PO TBEC: 81.0000 mg | DELAYED_RELEASE_TABLET | Freq: Every day | ORAL | 11 refills | Status: AC

## 2021-03-08 MED ORDER — VITAMIN D (ERGOCALCIFEROL) 1.25 MG (50000 UNIT) PO CAPS: 50000.0000 [IU] | ORAL_CAPSULE | ORAL | 0 refills | Status: AC

## 2021-03-08 MED ORDER — CYANOCOBALAMIN 500 MCG PO TABS: 500.0000 ug | ORAL_TABLET | Freq: Every day | ORAL | 2 refills | Status: AC

## 2021-03-08 NOTE — Discharge Summary (Signed)
[facility] Discharge Summary   Patient: Andrea GroomsDynashia Weiss ZHY:[id number]  PCP[facility]  Date of admission: 03/05/2021   Date of discharge:  03/08/2021     Discharge Diagnoses:  Principal diagnosis Acute CVA Active Problems:   Acute CVA (cerebrovascular accident) [facility](HCC)   Admitted From:  Disposition:  Home   Recommendations for Outpatient Follow-up:  1. PCP: in 1 wk 2. F/u Neuro in 1-2 wks  3. F/u Cardio for TEE in 1 wk 4. Follow up LABS/TEST:  TEE   Diet recommendation: Cardiac diet  Activity: The patient is advised to gradually reintroduce usual activities, as tolerated  Discharge Condition: stable  Code Status: Full code   History of present illness: As per the H and P dictated on admission Hospital Course:  Andrea Hargraveis a 25 y.o.femalewith medical history significant foranxiety and depression, anemia who is 2-week postpartum, and presents to the emergency room with acute onset ofrightfacial droopand right hand weakness, slurred speech, dysarthria and expressive dysphasia.  Patient denied any other focal deficits and no any other complaints. ED work-up: MRI brain shows small acute infarct of the left anterior forearm nucleus adjacent to the posterior limb of the left internal capsule with no hemorrhage or mass.  MRA negative.  2D echocardiogram with bubble study pending Neurology was consulted and patient was admitted for further management as below Assessment and Plan: # Acute small acute infarction of the left lentiform nucleus adjacent to the posterior limb of the left internal capsule. S/p telemetry monitoring and neurochecks as per protocol, no events during hospital stay.  Given aspirin 325 mg during hospital stay, transition to aspirin 81 mg daily as per neurology recommendation. LDL 76 not very high so discontinued Lipitor as patient is breast-feeding, which is a contraindication.  Neurologist agreed. A1c 5.4 and TSH 1.9  wnl. Carotid Doppler did not show significant stenosis and lower extremity venous duplex negative for DVT. PT/OT and ST consulted, patient will need outpatient PT and OT and speech therapy.  Was seen by neurology, recommended TEE due to stroke in young patient which is a strong recommendation.  Cardiology consulted, recommended it cannot be done today so we will schedule as an outpatient and patient agreed with the plan for TEE as an outpatient.  TTE negative bubble study. F/u autoimmune and hypercoagulable work-up ordered by neurology.  Patient was recommended to follow with neurology as an outpatient for further work-up and management # Depression. continue Zoloft. # Anemia. continue ferrous sulfate. #Vitamin B12 level 260, target >400, started vitamin B-12 1000 mcg IM injection during hospital stay, start oral supplement on discharge. #Vitamin D deficiency, started vitamin D 50k units p.o. weekly for total 8 weeks, follow with PCP to repeat vitamin D level after 3 to 6 months Body mass index is 23.17 kg/m.  Interventions:  Overall patient remained stable, medically optimized and her neurological symptoms resolved.  Patient was cleared by neurology and cardiology to discharge and follow-up as an outpatient.   Patient was seen by physical therapy, who recommended out pt PT/OT and SLP, which was arranged. On the day of the discharge the patient's vitals were stable, and no other acute medical condition were reported by patient. the patient was felt safe to be discharge at Home.  Consultants: Neuro and Cardio for TEE Procedures: none  Discharge Exam: General: Appear in no distress, no Rash; Oral Mucosa Clear, moist. Cardiovascular: S1 and S2 Present, no Murmur, Respiratory: no normal respiratory effort, Bilateral Air entry present and no Crackles,  no wheezes Abdomen: Bowel Sound present, Soft and no tenderness, no hernia Extremities: no Pedal edema, no calf tenderness Neurology: alert and  oriented to time, place, and person affect appropriate.  Filed Weights   03/05/21 2011  Weight: 61.2 kg   Vitals:   03/08/21 0853 03/08/21 1255  BP: 115/76 120/88  Pulse: 100 86  Resp: 18 16  Temp: 98.3 F (36.8 C) 97.8 F (36.6 C)  SpO2: 100% 100%    DISCHARGE MEDICATION: Allergies as of 03/08/2021      Reactions   Mango Flavor Hives, Swelling   Allergy to mangos      Medication List    STOP taking these medications   ibuprofen 600 MG tablet Commonly known as: ADVIL     TAKE these medications   acetaminophen 500 MG tablet Commonly known as: TYLENOL Take 2 tablets (1,000 mg total) by mouth every 6 (six) hours as needed (for pain scale < 4).   aspirin EC 81 MG tablet Take 1 tablet (81 mg total) by mouth daily. Swallow whole.   benzocaine-Menthol 20-0.5 % Aero Commonly known as: DERMOPLAST Apply 1 application topically as needed for irritation (perineal discomfort).   cetirizine 10 MG tablet Commonly known as: ZYRTEC Take 1 tablet (10 mg total) by mouth daily.   coconut oil Oil Apply 1 application topically as needed.   docusate sodium 100 MG capsule Commonly known as: COLACE Take 1 capsule (100 mg total) by mouth 2 (two) times daily.   ferrous sulfate 325 (65 FE) MG tablet Take 1 tablet (325 mg total) by mouth 2 (two) times daily with a meal.   multivitamin-prenatal 27-0.8 MG Tabs tablet Take 2 tablets by mouth daily at 12 noon. 2 gummies per day   sertraline 25 MG tablet Commonly known as: ZOLOFT Take 25 mg by mouth daily.   simethicone 80 MG chewable tablet Commonly known as: MYLICON Chew 1 tablet (80 mg total) by mouth as needed for flatulence.   vitamin B-12 500 MCG tablet Commonly known as: CYANOCOBALAMIN Take 1 tablet (500 mcg total) by mouth daily.   Vitamin D (Ergocalciferol) 1.25 MG (50000 UNIT) Caps capsule Commonly known as: DRISDOL Take 1 capsule (50,000 Units total) by mouth every 7 (seven) days for 7 doses. Start taking on: March 14, 2021   witch hazel-glycerin pad Commonly known as: TUCKS Apply 1 application topically continuous.      Allergies  Allergen Reactions  . Mango Flavor Hives and Swelling    Allergy to [facility]   Discharge Instructions    Call MD for:   Complete by: As directed    Call MD for any new neurological changes   Diet - low sodium heart healthy   Complete by: As directed    Discharge instructions   Complete by: As directed    Follow-up with PCP in 1 week, repeat vitamin B-12 and vitamin D level after 3 to 6 months Repeat iron profile after 3 to 6 months Follow with neurology in 1 weeks Follow with cardiology in 1 week for transesophageal echocardiogram and then follow-up with neurology for report and further management.   Increase activity slowly   Complete by: As directed       The results of significant diagnostics from this hospitalization (including imaging, microbiology, ancillary and laboratory) are listed below for reference.    Significant Diagnostic Studies: CT Head Wo Contrast  Result Date: 03/05/2021 CLINICAL DATA:  One month postpartum. Slurred speech, right arm weakness. EXAM: CT HEAD WITHOUT  CONTRAST TECHNIQUE: Contiguous axial images were obtained from the base of the skull through the vertex without intravenous contrast. COMPARISON:  None. FINDINGS: Brain: No acute intracranial abnormality. Specifically, no hemorrhage, hydrocephalus, mass lesion, acute infarction, or significant intracranial injury. Vascular: No hyperdense vessel or unexpected calcification. Skull: No acute calvarial abnormality. Sinuses/Orbits: Visualized paranasal sinuses and mastoids clear. Orbital soft tissues unremarkable. Other: None IMPRESSION: Normal study. Electronically Signed   By: Charlett Nose M.D.   On: 03/05/2021 20:51   MR ANGIO HEAD WO CONTRAST  Result Date: 03/06/2021 CLINICAL DATA:  Unilateral weakness EXAM: MRA HEAD WITHOUT CONTRAST TECHNIQUE: Angiographic images of the [place] were obtained using MRA technique without intravenous contrast. COMPARISON:  None. FINDINGS: POSTERIOR CIRCULATION: --Vertebral arteries: Normal --Inferior cerebellar arteries: Normal. --Basilar artery: Normal. --Superior cerebellar arteries: Normal. --Posterior cerebral arteries: Normal. ANTERIOR CIRCULATION: --Intracranial internal carotid arteries: Normal. --Anterior cerebral arteries (ACA): Normal. --Middle cerebral arteries (MCA): Normal. ANATOMIC VARIANTS: Both P comms are present. IMPRESSION: Normal intracranial MRA. Electronically Signed   By: Deatra Robinson M.D.   On: 03/06/2021 00:10   MR Brain W and Wo Contrast  Result Date: 03/06/2021 CLINICAL DATA:  Slurred speech and facial numbness EXAM: MRI HEAD WITHOUT AND WITH CONTRAST TECHNIQUE: Multiplanar, multiecho pulse sequences of the brain and surrounding structures were obtained without and with intravenous contrast. CONTRAST:  7.40mL GADAVIST GADOBUTROL 1 MMOL/ML IV SOLN COMPARISON:  Head CT 03/05/2021 FINDINGS: Brain: There is a focus of abnormal diffusion restriction in the left lentiform nucleus adjacent to the posterior limb of the left internal capsule. No acute or chronic hemorrhage. Mild edema at the site of diffusion restriction but otherwise normal white matter signal, parenchymal volume and CSF spaces. The midline structures are normal. There is no abnormal contrast enhancement. Vascular: Major flow voids are preserved. Skull and upper cervical spine: Normal calvarium and skull base. Visualized upper cervical spine and soft tissues are normal. Sinuses/Orbits:No paranasal sinus fluid levels or advanced mucosal thickening. No mastoid or middle ear effusion. Normal orbits. 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 No hemorrhage or mass effect. Electronically Signed   By: Deatra Robinson M.D.   On: 03/06/2021 00:08   MR Cervical Spine W or Wo Contrast  Result  Date: 03/06/2021 CLINICAL DATA:  Unilateral weakness and slurred speech EXAM: MRI CERVICAL SPINE WITHOUT AND WITH CONTRAST TECHNIQUE: Multiplanar and multiecho pulse sequences of the cervical spine, to include the craniocervical junction and cervicothoracic junction, were obtained without and with intravenous contrast. CONTRAST:  7.35mL GADAVIST GADOBUTROL 1 MMOL/ML IV SOLN COMPARISON:  None. FINDINGS: Alignment: Physiologic. Reversal of normal cervical lordosis may be positional or due to muscle spasm. Vertebrae: No fracture, evidence of discitis, or bone lesion. Cord: Normal signal and morphology. Posterior Fossa, vertebral arteries, paraspinal tissues: Negative. Disc levels: No spinal canal or neural foraminal stenosis. IMPRESSION: Reversal of normal cervical lordosis may be positional or due to muscle spasm. Otherwise normal MRI of the cervical spine. Electronically Signed   By: Deatra Robinson M.D.   On: 03/06/2021 00:09   US Carotid Bilateral  Result Date: 03/06/2021 CLINICAL DATA:  Stroke EXAM: BILATERAL CAROTID DUPLEX ULTRASOUND TECHNIQUE: Wallace Cullens scale imaging, color Doppler and duplex ultrasound were performed of bilateral carotid and vertebral arteries in the neck. COMPARISON:  None. FINDINGS: Criteria: Quantification of carotid stenosis is based on velocity parameters that correlate the residual internal carotid diameter with NASCET-based stenosis levels, using the diameter of  the distal internal carotid lumen as the denominator for stenosis measurement. The following velocity measurements were obtained: RIGHT ICA: 82/43 cm/sec CCA: 106/27 cm/sec SYSTOLIC ICA/CCA RATIO:  0.8 ECA: 89 cm/sec LEFT ICA: 106/49 cm/sec CCA: 104/27 cm/sec SYSTOLIC ICA/CCA RATIO:  1.0 ECA: 79 cm/sec RIGHT CAROTID ARTERY: Borderline elevated end-diastolic velocity likely artifact given lack of significant atheromatous plaque. RIGHT VERTEBRAL ARTERY:  Antegrade flow. LEFT CAROTID ARTERY: Borderline elevated end-diastolic velocity  likely artifact given lack of significant atheromatous plaque. LEFT VERTEBRAL ARTERY:  Antegrade flow. IMPRESSION: No evidence of significant internal carotid artery stenosis. Electronically Signed   By: Acquanetta Belling M.D.   On: 03/06/2021 15:05   US Venous Img Lower Bilateral (DVT)  Result Date: 03/06/2021 CLINICAL DATA:  Pain for 1 week EXAM: BILATERAL LOWER EXTREMITY VENOUS DOPPLER ULTRASOUND TECHNIQUE: Gray-scale sonography with compression, as well as color and duplex ultrasound, were performed to evaluate the deep venous system(s) from the level of the common femoral vein through the popliteal and proximal calf veins. COMPARISON:  None. FINDINGS: VENOUS Normal compressibility of the common femoral, superficial femoral, and popliteal veins, as well as the visualized calf veins. Visualized portions of profunda femoral vein and great saphenous vein unremarkable. No filling defects to suggest DVT on grayscale or color Doppler imaging. Doppler waveforms show normal direction of venous flow, normal respiratory plasticity and response to augmentation. OTHER None. Limitations: none IMPRESSION: No left lower extremity DVT Electronically Signed   By: Acquanetta Belling M.D.   On: 03/06/2021 15:02   ECHOCARDIOGRAM COMPLETE BUBBLE STUDY  Result Date: 03/08/2021    ECHOCARDIOGRAM REPORT   Patient Name:   Andrea Weiss Date of Exam: 03/08/2021 Medical Rec #:  [id number]         Height:       64.0 in Accession #:    [id number]        Weight:       135.0 lb Date of Birth:  [birth year]         BSA:          1.655 m Patient Age:    24 years          BP:           107/80 mmHg Patient Gender: F                 HR:           96 bpm. Exam Location:  [facility] Procedure: 2D Echo, Color Doppler, Cardiac Doppler and Saline Contrast Bubble            Study STAT ECHO Indications:     I63.9 Stroke  History:         Patient has no prior history of Echocardiogram examinations.                  Risk Factors:Current Smoker. S/P pregancy.   Sonographer:     Humphrey Rolls RDCS (AE) Referring Phys:  [id number] Gillis Santa Diagnosing Phys: Harold Hedge MD IMPRESSIONS  1. Left ventricular ejection fraction, by estimation, is 55 to 60%. The left ventricle has normal function. The left ventricle has no regional wall motion abnormalities. Left ventricular diastolic parameters were normal.  2. Right ventricular systolic function is normal. The right ventricular size is normal.  3. The mitral valve is grossly normal. No evidence of mitral valve regurgitation.  4. The aortic valve is tricuspid. Aortic valve regurgitation is not visualized. FINDINGS  Left Ventricle: Left ventricular ejection fraction, by estimation, is  55 to 60%. The left ventricle has normal function. The left ventricle has no regional wall motion abnormalities. The left ventricular internal cavity size was normal in size. There is  no left ventricular hypertrophy. Left ventricular diastolic parameters were normal. Right Ventricle: The right ventricular size is normal. No increase in right ventricular wall thickness. Right ventricular systolic function is normal. Left Atrium: Left atrial size was normal in size. Right Atrium: Right atrial size was normal in size. Pericardium: There is no evidence of pericardial effusion. Mitral Valve: The mitral valve is grossly normal. No evidence of mitral valve regurgitation. MV peak gradient, 2.8 mmHg. The mean mitral valve gradient is 1.0 mmHg. Tricuspid Valve: The tricuspid valve is grossly normal. Tricuspid valve regurgitation is trivial. Aortic Valve: The aortic valve is tricuspid. Aortic valve regurgitation is not visualized. Aortic valve mean gradient measures 3.0 mmHg. Aortic valve peak gradient measures 5.9 mmHg. Aortic valve area, by VTI measures 1.83 cm. Pulmonic Valve: The pulmonic valve was not well visualized. Pulmonic valve regurgitation is not visualized. Aorta: The aortic root is normal in size and structure. IAS/Shunts: No atrial level shunt  detected by color flow Doppler. Agitated saline contrast was given intravenously to evaluate for intracardiac shunting.  LEFT VENTRICLE PLAX 2D LVIDd:         3.90 cm  Diastology LVIDs:         2.80 cm  LV e' medial:    8.27 cm/s LV PW:         1.00 cm  LV E/e' medial:  9.5 LV IVS:        0.70 cm  LV e' lateral:   9.36 cm/s LVOT diam:     1.90 cm  LV E/e' lateral: 8.4 LV SV:         45 LV SV Index:   27 LVOT Area:     2.84 cm  RIGHT VENTRICLE RV Basal diam:  2.70 cm LEFT ATRIUM             Index       RIGHT ATRIUM           Index LA diam:        2.00 cm 1.21 cm/m  RA Area:     12.00 cm LA Vol (A2C):   28.1 ml 16.97 ml/m RA Volume:   27.70 ml  16.73 ml/m LA Vol (A4C):   26.2 ml 15.83 ml/m LA Biplane Vol: 28.1 ml 16.97 ml/m  AORTIC VALVE                   PULMONIC VALVE AV Area (Vmax):    1.85 cm    PV Vmax:       0.78 m/s AV Area (Vmean):   1.97 cm    PV Vmean:      56.800 cm/s AV Area (VTI):     1.83 cm    PV VTI:        0.180 m AV Vmax:           121.00 cm/s PV Peak grad:  2.4 mmHg AV Vmean:          83.800 cm/s PV Mean grad:  1.0 mmHg AV VTI:            0.243 m AV Peak Grad:      5.9 mmHg AV Mean Grad:      3.0 mmHg LVOT Vmax:         79.00 cm/s LVOT Vmean:  58.300 cm/s LVOT VTI:          0.157 m LVOT/AV VTI ratio: 0.65  AORTA Ao Root diam: 2.70 cm MITRAL VALVE               TRICUSPID VALVE MV Area (PHT): 3.60 cm    TR Peak grad:   14.0 mmHg MV Area VTI:   2.61 cm    TR Vmax:        187.00 cm/s MV Peak grad:  2.8 mmHg MV Mean grad:  1.0 mmHg    SHUNTS MV Vmax:       0.84 m/s    Systemic VTI:  0.16 m MV Vmean:      56.6 cm/s   Systemic Diam: 1.90 cm MV Decel Time: 211 msec MV E velocity: 78.40 cm/s MV A velocity: 52.70 cm/s MV E/A ratio:  1.49 Harold Hedge MD Electronically signed by Harold Hedge MD Signature Date/Time: 03/08/2021/11:48:26 AM    Final     Microbiology: Recent Results (from the past 240 hour(s))  Resp Panel by RT-PCR (Flu A&B, Covid) Nasopharyngeal Swab     Status: None    Collection Time: 03/06/21  1:10 AM   Specimen: Nasopharyngeal Swab; Nasopharyngeal(NP) swabs in vial transport medium  Result Value Ref Range Status   SARS Coronavirus 2 by RT PCR NEGATIVE NEGATIVE Final    Comment: (NOTE) SARS-CoV-2 target nucleic acids are NOT DETECTED.  The SARS-CoV-2 RNA is generally detectable in upper respiratory specimens during the acute phase of infection. The lowest concentration of SARS-CoV-2 viral copies this assay can detect is 138 copies/mL. A negative result does not preclude SARS-Cov-2 infection and should not be used as the sole basis for treatment or other patient management decisions. A negative result may occur with  improper specimen collection/handling, submission of specimen other than nasopharyngeal swab, presence of viral mutation(s) within the areas targeted by this assay, and inadequate number of viral copies(<138 copies/mL). A negative result must be combined with clinical observations, patient history, and epidemiological information. The expected result is Negative.  Fact Sheet for Patients:  BloggerCourse.com  Fact Sheet for Healthcare Providers:  SeriousBroker.it  This test is no t yet approved or cleared by the [place] FDA and  has been authorized for detection and/or diagnosis of SARS-CoV-2 by FDA under an Emergency Use Authorization (EUA). This EUA will remain  in effect (meaning this test can be used) for the duration of the COVID-19 declaration under Section 564(b)(1) of the Act, 21 U.S.C.section 360bbb-3(b)(1), unless the authorization is terminated  or revoked sooner.       Influenza A by PCR NEGATIVE NEGATIVE Final   Influenza B by PCR NEGATIVE NEGATIVE Final    Comment: (NOTE) The Xpert Xpress SARS-CoV-2/FLU/RSV plus assay is intended as an aid in the diagnosis of influenza from Nasopharyngeal swab specimens and should not be used as a sole basis for treatment.  Nasal washings and aspirates are unacceptable for Xpert Xpress SARS-CoV-2/FLU/RSV testing.  Fact Sheet for Patients: BloggerCourse.com  Fact Sheet for Healthcare Providers: SeriousBroker.it  This test is not yet approved or cleared by the [place] FDA and has been authorized for detection and/or diagnosis of SARS-CoV-2 by FDA under an Emergency Use Authorization (EUA). This EUA will remain in effect (meaning this test can be used) for the duration of the COVID-19 declaration under Section 564(b)(1) of the Act, 21 U.S.C. section 360bbb-3(b)(1), unless the authorization is terminated or revoked.  Performed at [facility], [address]  [place], [place] [address]      Labs: CBC: Recent Labs  Lab 03/05/21 2040 03/06/21 0606 03/07/21 0403 03/08/21 0402  WBC 4.4 5.1 3.6* 4.5  NEUTROABS 1.9  --   --   --   HGB 13.0 12.4 12.5 13.4  HCT 41.6 39.6 40.6 41.3  MCV 81.4 80.5 81.9 80.0  PLT 212 195 179 193   Basic Metabolic Panel: Recent Labs  Lab 03/05/21 2040 03/06/21 0606 03/07/21 0403 03/08/21 0402  NA 138 139 140 136  K 3.8 3.9 4.5 3.8  CL 105 108 105 102  CO2 GLUCOSE 106* 94 81 83  BUN CREATININE 0.65 0.59 0.74 0.64  CALCIUM 9.1 8.7* 9.2 9.2  MG  --   --  1.9 2.0  PHOS  --   --  5.4* 5.3*   Liver Function Tests: Recent Labs  Lab 03/05/21 2040  AST 20  ALT 22  ALKPHOS 79  BILITOT 0.8  PROT 7.6  ALBUMIN 4.1   No results for input(s): LIPASE, AMYLASE in the last 168 hours. No results for input(s): AMMONIA in the last 168 hours. Cardiac Enzymes: No results for input(s): CKTOTAL, CKMB, CKMBINDEX, TROPONINI in the last 168 hours. BNP (last 3 results) No results for input(s): BNP in the last 8760 hours. CBG: No results for input(s): GLUCAP in the last 168 hours.  Time spent: 35 minutes  Signed:  Gillis Santa  [facility]  03/08/2021 2:50 PM

## 2021-03-08 NOTE — Progress Notes (Addendum)
NEUROLOGY CONSULTATION PROGRESS NOTE   Date of service: March 08, 2021 Patient Name: Andrea Weiss MRN:  [id number] DOB:  08-26-1996  Brief HPI  FROM DR. KHALIQDINA's INITIAL CONSULT Andrea Weiss is a 25 y.o. 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 Her neurologic examination is notable for mild R facial asymmetry, mild RUE and RLE weakness notable only on detailed neuro exam. NIHSS is a 1.   Interval Hx   Seen and examined. No complaints at this time.  Reports no tingling numbness weakness.  No facial asymmetry reported.  No difficulty with speech reported.  Vitals   Vitals:   03/07/21 2027 03/07/21 2329 03/08/21 0408 03/08/21 0853  BP: (!) 129/102 120/76 107/80 115/76  Pulse: 72 63 81 100  Resp: 16 16 16 18   Temp: 98.2 F (36.8 C) 98.8 F (37.1 C) 97.7 F (36.5 C) 98.3 F (36.8 C)  TempSrc: Oral Oral Oral   SpO2: 100% 100% 100% 100%  Weight:      Height:         Body mass index is 23.17 kg/m.  Physical Exam  General: Awake alert in no distress sitting in bed comfortably on a FaceTime call with family member HEENT: Normocephalic atraumatic Lungs: Clear to auscultation Cardiovascular: Regular rhythm Extremities: Warm well perfused Abdomen: Nondistended nontender  Neurologic Examination  Mental status/Cognition: Alert, oriented to self, place, month and year, good attention.  Speech/language: Fluent, comprehension intact, object naming intact, repetition intact.  Cranial nerves: Pupils equal round reactive to light, extraocular movements intact, facial sensation intact, no facial asymmetry, auditory Q-T intact, palate midline, shoulder shrug and trapezius strength  bilaterally symmetric, tongue midline. Motor:  Muscle bulk: normal, tone normal.  5/5 in all proximal and distal muscle groups in upper and lower extremities bilaterally. Sensation: Intact to light touch all over without extinction Coordination/Complex Motor:  - Finger to Nose intact - Rapid alternating movement are normal - Gait: deferred.  Labs   Basic Metabolic Panel:  Lab Results  Component Value Date   NA 136 03/08/2021   K 3.8 03/08/2021   CO2 24 03/08/2021   GLUCOSE 83 03/08/2021   BUN 8 03/08/2021   CREATININE 0.64 03/08/2021   CALCIUM 9.2 03/08/2021   GFRNONAA >60 03/08/2021   GFRAA >60 03/28/2020   HbA1c:  Lab Results  Component Value Date   HGBA1C 5.4 03/06/2021   LDL:  Lab Results  Component Value Date   LDLCALC 76 03/06/2021   Urine Drug Screen:     Component Value Date/Time   LABOPIA NONE DETECTED 03/05/2021 2230   COCAINSCRNUR NONE DETECTED 03/05/2021 2230   LABBENZ NONE DETECTED 03/05/2021 2230   AMPHETMU NONE DETECTED 03/05/2021 2230   THCU POSITIVE (A) 03/05/2021 2230   LABBARB NONE DETECTED 03/05/2021 2230    Alcohol Level No results found for: ETH No results found for: PHENYTOIN, ZONISAMIDE, LAMOTRIGINE, LEVETIRACETA No results found for: PHENYTOIN, PHENOBARB, VALPROATE, CBMZ  Imaging and Diagnostic studies   CT Head without contrast: CTH was negative for a large hypodensity concerning for a large territory infarct or hyperdensity concerning for an ICH  MRI Brain  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  MR Angio head w/o contrast: Pending  US Carotid duplex: No evidence of significant internal carotid artery stenosis.  [place] BL Lower ext: No left lower extremity DVT  TTE: Pending.  Assessment  25 year old woman past medical history of depression, anxiety, recent uncomplicated pregnancy postpartum, has a history of miscarriage, 1 cigarette/day smoker  presented with acute onset of slurred and slowed speech-found to have a small left lentiform nucleus/posterior limb of internal capsule acute lacunar infarct. Initial NIH stroke scale on presentation was 1.  NIH stroke scale today is 0.  Brain MRI as above. No emergent LVO and no intracranial atherosclerosis. No evidence of dural venous sinus thrombosis on the post contrasted MRI. Hypercoagulable labs pending Transthoracic echo pending  Etiology of the stroke remains under investigation-location and appearance of the stroke suggestive of small vessel etiology.  Stroke work-up in young initiated  Impression: - Acute ischemic stroke- etiology under investigation -Evaluate for stroke risk factors and young including hypercoagulable panel   Recommendations  - F/up TTE, if negative will plan on TEE. - Continue Aspirin 81mg  daily - Initially planned for atorvastatin but given its secretion in brest milk and that patient is breast feeding, will hold off on it. Her LDL is 76 and not that significantly elevated. - Pt is being followed by PT/OT ans SLP. - Urine tox screen positive for cannabinoids. - Counseled on the importance of quitting smoking and Marijuana - continue Cardiac monitoring. We will follow with you  -- , MD Neurologist [facility] Pager: [phone]  ADDENDUM 2d echo completed and unremarkable for cardiac source of stroke. Would need a TEE to complete work up. Cardiology might be able to do in the week as outpatient. Follow up with Stroke Neurology (At [facility] or [facility]). Plan relayed to Dr. [facility] via secure chat.  -- Lucianne Muss, MD Neurologist [facility] Pager: [phone]

## 2021-03-08 NOTE — Progress Notes (Signed)
Occupational Therapy Treatment Patient Details Name: Andrea Weiss MRN: [id number] DOB: 1996-02-24 Today's Date: 03/08/2021    History of present illness Pt is a 25 y/o F admitted from home on 03/05/21 with c/c of R UE weakness & pt endorses dysarthria, expressive dysphasia, & HA. Pt is 2 weeks postpartum. 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 PMH: anxiety, depression, anemia, abdominal hernia   OT comments  Upon entering the room, pt supine in bed and having just finished pumping. Pt is very pleasant and cooperative this session with no c/o pain this session. Pt reports sensation and fine motor coordination has returned to R UE and LE. Pt reports being able to utilize phone, open food containers, and perform ADL fasteners without issue. Pt requesting to ambulate and try steps again this session and she ambulates 1000' and 2 flights of stairs independently without issue. Pt reports no further concerns at this time. OT to SIGN OFF secondary to pt's progress. '   Follow Up Recommendations  No OT follow up    Equipment Recommendations  None recommended by OT       Precautions / Restrictions Precautions Precautions: None Restrictions Weight Bearing Restrictions: No       Mobility Bed Mobility Overal bed mobility: Independent Bed Mobility: Supine to Sit;Sit to Supine     Supine to sit: Independent          Transfers Overall transfer level: Independent   Transfers: Sit to/from Stand Sit to Stand: Independent              Balance Overall balance assessment: Independent Sitting-balance support: Feet supported;No upper extremity supported Sitting balance-Leahy Scale: Normal     Standing balance support: No upper extremity supported;During functional activity Standing balance-Leahy Scale: Normal Standing balance comment: able to perform RLE single leg stance  without LOB & without assist to don shoe on LLE                           ADL either performed or assessed with clinical judgement   ADL Overall ADL's : Independent Eating/Feeding: Independent                   Lower Body Dressing: Independent               Functional mobility during ADLs: Independent General ADL Comments: Pt demonstrates and reports use of R hand in dominant fashion and reports [place] as returning to normal as well as sensation.     Vision Baseline Vision/History: No visual deficits            Cognition Arousal/Alertness: Awake/alert Behavior During Therapy: WFL for tasks assessed/performed Overall Cognitive Status: Within Functional Limits for tasks assessed                                 General Comments: AxO x 4, very pleasant & receptive of all education                   Pertinent Vitals/ Pain       Pain Assessment: No/denies pain  Home Living Family/patient expects to be discharged to:: (P) Private residence  Frequency  Min 1X/week        Progress Toward Goals  OT Goals(current goals can now be found in the care plan section)  Progress towards OT goals: Progressing toward goals;Goals met/education completed, patient discharged from OT  Acute Rehab OT Goals Patient Stated Goal: get better OT Goal Formulation: With patient Time For Goal Achievement: 03/20/21 Potential to Achieve Goals: Good  Plan Discharge plan remains appropriate       AM-PAC OT "6 Clicks" Daily Activity     Outcome Measure   Help from another person eating meals?: None Help from another person taking care of personal grooming?: None Help from another person toileting, which includes using toliet, bedpan, or urinal?: None Help from another person bathing (including washing, rinsing, drying)?: None Help from another person to put on and taking off regular upper  body clothing?: None Help from another person to put on and taking off regular lower body clothing?: None 6 Click Score: 24    End of Session    OT Visit Diagnosis: Unsteadiness on feet (R26.81);Muscle weakness (generalized) (M62.81)   Activity Tolerance Patient tolerated treatment well   Patient Left in bed   Nurse Communication          Time: 3570-1779 OT Time Calculation (min): 11 min  Charges: OT General Charges $OT Visit: 1 Visit OT Treatments $Neuromuscular Re-education: 8-22 mins   Darleen Crocker, MS, OTR/L , CBIS ascom [phone]  03/08/21, 3:09 PM

## 2021-03-08 NOTE — Progress Notes (Signed)
*  PRELIMINARY RESULTS* Echocardiogram 2D Echocardiogram has been performed.  Andrea Weiss 03/08/2021, 10:09 AM 

## 2021-03-08 NOTE — Progress Notes (Signed)
Requested tee this afternoon after tte bubble study was negative. Procedure not available today due to space issues. Ok from my standpoint to discharge and schedule as out patient from my office.

## 2021-03-08 NOTE — Plan of Care (Signed)

## 2021-03-08 NOTE — Progress Notes (Addendum)
SLP Cancellation Note  Patient Details Name: Andrea Weiss MRN: [id number] DOB: 06-05-1996   Cancelled treatment:       Reason Eval/Treat Not Completed: SLP screened, no needs identified, will sign off (chart reviewed; met w/ pt in room, then NSG). Pt is A/O x4 this morning w/ Fluent speech and No apparent Dysarthria --- much improved since initial eval 2 days ago. Pt denied any speech deficits currently stating "I started speaking like myself yesterday"; she was quite excited and relieved by this. Discussed her speech in length, and pt denied any further needs at this time. Encouraged her to f/u w/ her PCP post d/c if any questions, concerns once returned home. Pt agreed. ST services will sign off at this time. NSG to reconsult if any change or decline in status while admitted. She is eager to return home to her infant Dtr and family for her Dtr's 31 month Birthday tomorrow.  NSG and CM updated.      Orinda Kenner, MS, CCC-SLP Speech Language Pathologist Rehab Services [phone]  [facility] 03/08/2021, 2:36 PM

## 2021-03-08 NOTE — TOC Initial Note (Signed)
Transition of Care [facility]) - Initial/Assessment Note    Patient Details  Name: Andrea Weiss MRN: [id number] Date of Birth: 1995/12/04  [facility] CM/SW Contact:    Shelbie Hutching, RN Phone Number: 03/08/2021, 2:10 PM  Clinical Narrative:                 Patient admitted to the hospital with a stroke.  RNCM met with patient at the bedside this morning.  Patient is doing well and has improved significantly.  PT and OT are no longer recommending any PT or OT follow up.   Patient reports that she lives with her fiance, she recently had a baby who will be 19 month old tomorrow.  Patient would like to go ahead and get home today if possible.   Patient reports great social support system at home.    Expected Discharge Plan: Home/Self Care Barriers to Discharge: Continued Medical Work up   Patient Goals and CMS Choice Patient states their goals for this hospitalization and ongoing recovery are:: Patient is ready to go home and be with her new baby, who will be one month tomorrow.      Expected Discharge Plan and Services Expected Discharge Plan: Home/Self Care       Living arrangements for the past 2 months: Single Family Home                 DME Arranged: N/A DME Agency: NA       HH Arranged: NA          Prior Living Arrangements/Services Living arrangements for the past 2 months: Single Family Home Lives with:: Significant Other Patient language and need for interpreter reviewed:: Yes Do you feel safe going back to the place where you live?: Yes      Need for Family Participation in Patient Care: Yes (Comment) (stroke) Care giver support system in place?: Yes (comment) (fiance and his family)   Criminal Activity/Legal Involvement Pertinent to Current Situation/Hospitalization: No - Comment as needed  Activities of Daily Living Home Assistive Devices/Equipment: None ADL Screening (condition at time of admission) Patient's cognitive ability adequate to  safely complete daily activities?: Yes Is the patient deaf or have difficulty hearing?: No Does the patient have difficulty seeing, even when wearing glasses/contacts?: No Does the patient have difficulty concentrating, remembering, or making decisions?: No Patient able to express need for assistance with ADLs?: Yes Does the patient have difficulty dressing or bathing?: No Independently performs ADLs?: Yes (appropriate for developmental age) Does the patient have difficulty walking or climbing stairs?: No Weakness of Legs: None Weakness of Arms/Hands: None  Permission Sought/Granted Permission sought to share information with : Case Manager,Family Supports Permission granted to share information with : Yes, Verbal Permission Granted              Emotional Assessment Appearance:: Appears stated age Attitude/Demeanor/Rapport: Engaged Affect (typically observed): Accepting Orientation: : Oriented to Self,Oriented to Place,Oriented to  Time,Oriented to Situation Alcohol / Substance Use: Not Applicable Psych Involvement: No (comment)  Admission diagnosis:  Facial droop [R29.810] Right hand weakness [R29.898] Acute CVA (cerebrovascular accident) [facility]) [I63.9] Patient Active Problem List   Diagnosis Date Noted  . Acute CVA (cerebrovascular accident) ([place]) 03/06/2021  . Encounter for elective induction of labor 02/06/2021  . Marijuana smoker 02/05/2021  . Encounter for supervision of other normal pregnancy, third trimester 07/21/2020   PCP:  Department, [place]:   [id number] [place]  [place] [address] Phone: [phone] Fax: [phone]     Social Determinants of Health (SDOH) Interventions    Readmission Risk Interventions No flowsheet data found.

## 2021-03-08 NOTE — Progress Notes (Signed)
Physical Therapy Treatment Patient Details Name: Andrea Weiss MRN: [id number] DOB: March 11, 1996 Today's Date: 03/08/2021    History of Present Illness Pt is a 25 y/o F admitted from home on 03/05/21 with c/c of R UE weakness & pt endorses dysarthria, expressive dysphasia, & HA. Pt is 2 weeks postpartum. 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 PMH: anxiety, depression, anemia, abdominal hernia    PT Comments    Pt seen for PT treatment with pt demonstrating improvement in verbal communication & reporting improvement in RUE & vision. Pt is able to ambulate 3 laps around nurses station without AD & independently, engaging in head turns, directional changes, and retrograde gait without LOB noted. Pt negotiates 5 steps without rails & supervision. At this time, pt no longer demonstrates acute PT needs. Anticipate pt will progress to independence with stairs as she continues to mobilize. Will sign off with no further PT recommendations; pt educated on this & in agreement. Please re-consult if new needs arise.     Follow Up Recommendations  No PT follow up     Equipment Recommendations  None recommended by PT    Recommendations for Other Services       Precautions / Restrictions Precautions Precautions: None Restrictions Weight Bearing Restrictions: No    Mobility  Bed Mobility Overal bed mobility: Independent                  Transfers Overall transfer level: Independent                  Ambulation/Gait Ambulation/Gait assistance: Independent Gait Distance (Feet): 550 Feet Assistive device: None Gait Pattern/deviations: WFL(Within Functional Limits) Gait velocity: WNL       Stairs Stairs: Yes Stairs assistance: Supervision Stair Management: No rails Number of Stairs: 5 General stair comments: reciprocal pattern   Wheelchair Mobility     Modified Rankin (Stroke Patients Only)       Balance     Sitting balance-Leahy Scale: Normal       Standing balance-Leahy Scale: Normal Standing balance comment: able to perform RLE single leg stance without LOB & without assist to don shoe on LLE                            Cognition Arousal/Alertness: Awake/alert Behavior During Therapy: WFL for tasks assessed/performed Overall Cognitive Status: Within Functional Limits for tasks assessed                                 General Comments: AxO x 4, very pleasant & receptive of all education      Exercises      General Comments        Pertinent Vitals/Pain Pain Assessment: No/denies pain    Home Living                      Prior Function            PT Goals (current goals can now be found in the care plan section) Acute Rehab PT Goals Patient Stated Goal: get better PT Goal Formulation: With patient Time For Goal Achievement: 03/20/21 Potential to Achieve Goals: Good Progress towards PT goals: Progressing toward goals    Frequency  PT Plan Discharge plan needs to be updated    Co-evaluation              AM-PAC PT "6 Clicks" Mobility   Outcome Measure  Help needed turning from your back to your side while in a flat bed without using bedrails?: None Help needed moving from lying on your back to sitting on the side of a flat bed without using bedrails?: None Help needed moving to and from a bed to a chair (including a wheelchair)?: None Help needed standing up from a chair using your arms (e.g., wheelchair or bedside chair)?: None Help needed to walk in hospital room?: None Help needed climbing 3-5 steps with a railing? : None 6 Click Score: 24    End of Session   Activity Tolerance: Patient tolerated treatment well Patient left: in bed;with call bell/phone within reach         Time: 1157-1205 PT Time Calculation (min) (ACUTE ONLY): 8  min  Charges:  $Therapeutic Activity: 8-22 mins                     Aleda Grana, PT, DPT 03/08/21, 12:13 PM    Sandi Mariscal 03/08/2021, 12:12 PM

## 2021-03-09 LAB — ANTIPHOSPHOLIPID SYNDROME EVAL, BLD
Anticardiolipin IgA: <9 APL U/mL (ref 0–11)
Anticardiolipin IgG: <9 GPL U/mL (ref 0–14)
Anticardiolipin IgM: <9 MPL U/mL (ref 0–12)
DRVVT: 30.4 s (ref 0.0–47.0)
PTT Lupus Anticoagulant: 46.1 s (ref 0.0–51.9)
Phosphatydalserine, IgA: <1 APS Units (ref 0–19)
Phosphatydalserine, IgG: 9 Units (ref 0–30)
Phosphatydalserine, IgM: 18 Units (ref 0–30)

## 2021-03-09 LAB — ANCA TITERS
Atypical P-ANCA titer: <1:20 {titer}
C-ANCA: <1:20 {titer}
P-ANCA: <1:20 {titer}

## 2021-03-09 LAB — MPO/PR-3 (ANCA) ANTIBODIES
ANCA Proteinase 3: <3.5 U/mL (ref 0.0–3.5)
Myeloperoxidase Abs: <9 U/mL (ref 0.0–9.0)

## 2021-03-09 SURGERY — ECHOCARDIOGRAM, TRANSESOPHAGEAL
Anesthesia: General

## 2021-03-11 ENCOUNTER — Encounter: Payer: Self-pay | Admitting: Diagnostic Neuroimaging

## 2021-03-11 ENCOUNTER — Ambulatory Visit: Payer: Medicaid Other | Admitting: Diagnostic Neuroimaging

## 2021-03-11 VITALS — BP 107/77 | HR 107 | Ht 64.0 in | Wt 121.4 lb

## 2021-03-11 DIAGNOSIS — I639 Cerebral infarction, unspecified: Secondary | ICD-10-CM | POA: Diagnosis not present

## 2021-03-11 LAB — FACTOR 5 LEIDEN

## 2021-03-11 LAB — MTHFR DNA ANALYSIS

## 2021-03-11 NOTE — Patient Instructions (Signed)
  POST-PARTUM STROKE (possibly from hypercoagulable post-partum state; left brain subcortical; h/o miscarriage x 1; tobacco and THC use) - continue aspirin 81mg  daily - follow up TEE (Dr. ) - check sickle cell screening

## 2021-03-11 NOTE — Progress Notes (Signed)
[facility]  PATIENT: Andrea Weiss DOB: 12/20/1995  REFERRING CLINICIAN: Department, [place] [facility] HISTORY FROM: patient  REASON FOR VISIT: new consult    HISTORICAL  CHIEF COMPLAINT:  Chief Complaint  Patient presents with  . New Patient (Initial Visit)    Rm 7 with Fiancee   . Cerebrovascular Accident    Pt here for out pt stroke f/u. Pt reported to the ED on 03/05/21 with stroke like sx. Pt was two weeks postpartum at the time of the stroke. She sts she is feeling well today no lingering weakness.     HISTORY OF PRESENT ILLNESS:   25 year old female here for evaluation of stroke.  03/05/2021 patient woke up with word finding difficulties and dropping things on the right hand.  She went to bed the night before around midnight and was in normal health.  She was about 2 weeks postpartum when this occurred.  Symptoms continued throughout the day and patient went to the hospital for evaluation.  She was admitted and found to have small left basal ganglia ischemic infarction.  Stroke work-up was completed.  No specific cause was found other than possible hypercoagulable state related to recent pregnancy.  She did have a history of miscarriage x1 in the past.  She has been smoking cigarettes and using marijuana occasionally for past few years.  Her stroke symptoms continued for about 1 to 2 days and have gradually resolved.  Today patient does not have any residual stroke symptoms.  She and her baby are doing well at home.    REVIEW OF SYSTEMS: Full 14 system review of systems performed and negative with exception of: As per HPI.  ALLERGIES: Allergies  Allergen Reactions  . Mango Flavor Hives and Swelling    Allergy to [facility] MEDICATIONS: Outpatient Medications Prior to Visit  Medication Sig Dispense Refill  . aspirin EC 81 MG tablet Take 1 tablet (81 mg total) by mouth daily. Swallow whole. 30 tablet 11  . benzocaine-Menthol (DERMOPLAST) 20-0.5 %  AERO Apply 1 application topically as needed for irritation (perineal discomfort).    . coconut oil OIL Apply 1 application topically as needed.  0  . docusate sodium (COLACE) 100 MG capsule Take 1 capsule (100 mg total) by mouth 2 (two) times daily. 10 capsule 0  . ferrous sulfate 325 (65 FE) MG tablet Take 1 tablet (325 mg total) by mouth 2 (two) times daily with a meal. 60 tablet 1  . vitamin B-12 (CYANOCOBALAMIN) 500 MCG tablet Take 1 tablet (500 mcg total) by mouth daily. 30 tablet 2  . [START ON 03/14/2021] Vitamin D, Ergocalciferol, (DRISDOL) 1.25 MG (50000 UNIT) CAPS capsule Take 1 capsule (50,000 Units total) by mouth every 7 (seven) days for 7 doses. 7 capsule 0  . acetaminophen (TYLENOL) 500 MG tablet Take 2 tablets (1,000 mg total) by mouth every 6 (six) hours as needed (for pain scale < 4). (Patient not taking: Reported on 03/11/2021) 30 tablet 0  . cetirizine (ZYRTEC) 10 MG tablet Take 1 tablet (10 mg total) by mouth daily. (Patient not taking: Reported on 03/11/2021) 14 tablet 2  . metroNIDAZOLE (FLAGYL) 500 MG tablet Take 500 mg by mouth 2 (two) times daily.    . Prenatal Vit-Fe Fumarate-FA (MULTIVITAMIN-PRENATAL) 27-0.8 MG TABS tablet Take 2 tablets by mouth daily at 12 noon. 2 gummies per day (Patient not taking: Reported on 03/11/2021)    . sertraline (ZOLOFT) 25 MG tablet Take 25 mg by mouth daily. (  Patient not taking: Reported on 03/11/2021)    . simethicone (MYLICON) 80 MG chewable tablet Chew 1 tablet (80 mg total) by mouth as needed for flatulence. (Patient not taking: Reported on 03/11/2021) 30 tablet 0  . witch hazel-glycerin (TUCKS) pad Apply 1 application topically continuous. (Patient not taking: Reported on 03/11/2021) 40 each 12   No facility-administered medications prior to visit.    PAST MEDICAL HISTORY: Past Medical History:  Diagnosis Date  . Abdominal hernia   . Anemia   . Anxiety   . Depression   . History of anemia   . History of pica   . History of urinary  tract infection   . Medical history non-contributory     PAST SURGICAL HISTORY: Past Surgical History:  Procedure Laterality Date  . Nexplanon insertion    . WISDOM TOOTH EXTRACTION Bilateral     FAMILY HISTORY: No family history on file.  SOCIAL HISTORY: Social History   Socioeconomic History  . Marital status: Single    Spouse name: Chris/fiance  . Number of children: 1  . Years of education: Not on file  . Highest education level: Not on file  Occupational History  . Not on file  Tobacco Use  . Smoking status: Former Smoker    Packs/day: 0.25    Types: Cigarettes    Quit date: 2021    Years since quitting: 1.3  . Smokeless tobacco: Never Used  Vaping Use  . Vaping Use: Former  . Quit date: 12/28/2019  Substance and Sexual Activity  . Alcohol use: Not Currently    Comment: quit when she found out she was pregnant, was occassioanl  . Drug use: Not Currently    Types: Marijuana    Comment: last MJ use was 2 weeks ago  . Sexual activity: Yes    Birth control/protection: I.U.D.  Other Topics Concern  . Not on file  Social History Narrative  . Not on file   Social Determinants of Health   Financial Resource Strain: Not on file  Food Insecurity: Not on file  Transportation Needs: Not on file  Physical Activity: Not on file  Stress: Not on file  Social Connections: Not on file  Intimate Partner Violence: Not At Risk  . Fear of Current or Ex-Partner: No  . Emotionally Abused: No  . Physically Abused: No  . Sexually Abused: No     PHYSICAL EXAM  GENERAL EXAM/CONSTITUTIONAL: Vitals:  Vitals:   03/11/21 1437  BP: 107/77  Pulse: (!) 107  Weight: 121 lb 6 oz (55.1 kg)  Height: 5\' 4"  (1.626 m)     Body mass index is 20.83 kg/m. Wt Readings from Last 3 Encounters:  03/11/21 121 lb 6 oz (55.1 kg)  03/05/21 135 lb (61.2 kg)  02/06/21 140 lb (63.5 kg)     Patient is in no distress; well developed, nourished and groomed; neck is  supple  CARDIOVASCULAR:  Examination of carotid arteries is normal; no carotid bruits  Regular rate and rhythm, no murmurs  Examination of peripheral vascular system by observation and palpation is normal  EYES:  Ophthalmoscopic exam of optic discs and posterior segments is normal; no papilledema or hemorrhages  No exam data present  MUSCULOSKELETAL:  Gait, strength, tone, movements noted in Neurologic exam below  NEUROLOGIC: MENTAL STATUS:  No flowsheet data found.  awake, alert, oriented to person, place and time  recent and remote memory intact  normal attention and concentration  language fluent, comprehension intact, naming intact  fund of knowledge appropriate  CRANIAL NERVE:   2nd - no papilledema on fundoscopic exam  2nd, 3rd, 4th, 6th - pupils equal and reactive to light, visual fields full to confrontation, extraocular muscles intact, no nystagmus  5th - facial sensation symmetric  7th - facial strength symmetric  8th - hearing intact  9th - palate elevates symmetrically, uvula midline  11th - shoulder shrug symmetric  12th - tongue protrusion midline  MOTOR:   normal bulk and tone, full strength in the BUE, BLE  SENSORY:   normal and symmetric to light touch, temperature, vibration  COORDINATION:   finger-nose-finger, fine finger movements normal  REFLEXES:   deep tendon reflexes present and symmetric  GAIT/STATION:   narrow based gait     DIAGNOSTIC DATA (LABS, IMAGING, TESTING) - I reviewed patient records, labs, notes, testing and imaging myself where available.  Lab Results  Component Value Date   WBC 4.5 03/08/2021   HGB 13.4 03/08/2021   HCT 41.3 03/08/2021   MCV 80.0 03/08/2021   PLT 193 03/08/2021      Component Value Date/Time   NA 136 03/08/2021 0402   K 3.8 03/08/2021 0402   CL 102 03/08/2021 0402   CO2 24 03/08/2021 0402   GLUCOSE 83 03/08/2021 0402   BUN 8 03/08/2021 0402   CREATININE 0.64 03/08/2021  0402   CALCIUM 9.2 03/08/2021 0402   PROT 7.6 03/05/2021 2040   ALBUMIN 4.1 03/05/2021 2040   AST 20 03/05/2021 2040   ALT 22 03/05/2021 2040   ALKPHOS 79 03/05/2021 2040   BILITOT 0.8 03/05/2021 2040   GFRNONAA >60 03/08/2021 0402   GFRAA >60 03/28/2020 1030   Lab Results  Component Value Date   CHOL 162 03/06/2021   HDL 68 03/06/2021   LDLCALC 76 03/06/2021   TRIG 89 03/06/2021   CHOLHDL 2.4 03/06/2021   Lab Results  Component Value Date   HGBA1C 5.4 03/06/2021   Lab Results  Component Value Date   VITAMINB12 260 03/06/2021   Lab Results  Component Value Date   TSH 1.967 03/06/2021      Ref Range & Units 5 d ago  Anti Nuclear Antibody (ANA) Negative Negative        Ref Range & Units 5 d ago  C-ANCA Neg:<1:20 titer <1:20   P-ANCA Neg:<1:20 titer <1:20        Ref Range & Units 5 d ago  Myeloperoxidase Abs 0.0 - 9.0 U/mL <9.0   ANCA Proteinase 3 0.0 - 3.5 U/mL <3.5 VC       Ref Range & Units 5 d ago  Anticardiolipin IgA 0 - 11 APL U/mL <9   Comment: (NOTE)              Negative:       <12              Indeterminate:   12 - 20              Low-Med Positive: >20 - 80              High Positive:     >80  Performed At: [facility]  [address] [place], [place] [id number]  Jolene Schimke MD WY:[id number]   Anticardiolipin IgG 0 - 14 GPL U/mL <9   Comment: (NOTE)              Negative:       <15  Indeterminate:   15 - 20              Low-Med Positive: >20 - 80              High Positive:     >80   Anticardiolipin IgM 0 - 12 MPL U/mL <9   Comment: (NOTE)              Negative:       <13              Indeterminate:   13 - 20              Low-Med Positive: >20 - 80              High Positive:     >80   PTT Lupus Anticoagulant 0.0 - 51.9 sec  46.1   DRVVT 0.0 - 47.0 sec 30.4   Phosphatydalserine, IgG 0 - 30 Units 9   Phosphatydalserine, IgM 0 - 30 Units 18   Phosphatydalserine, IgA 0 - 19 APS Units <1   Lupus Anticoag Interp  Comment: VC   Comment: No lupus anticoagulant was detected.      Ref Range & Units 5 d ago  Protein C Activity 73 - 180 % 110        Ref Range & Units 5 d ago  Protein S Activity 63 - 140 % 70        Ref Range & Units 5 d ago  AntiThromb III Func 75 - 120 % 109        03/05/21 MRI brain [I reviewed images myself and agree with interpretation. -VRP]  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 No hemorrhage or mass Effect.  03/05/21 MRA head - Normal intracranial MRA.  03/06/21 carotid u/s - No evidence of significant internal carotid artery stenosis.   03/08/21 TTE 1. Left ventricular ejection fraction, by estimation, is 55 to 60%. The  left ventricle has normal function. The left ventricle has no regional  wall motion abnormalities. Left ventricular diastolic parameters were  normal.  2. Right ventricular systolic function is normal. The right ventricular  size is normal.  3. The mitral valve is grossly normal. No evidence of mitral valve  regurgitation.  4. The aortic valve is tricuspid. Aortic valve regurgitation is not  visualized.     ASSESSMENT AND PLAN  25 y.o. year old female here with:  Dx:  1. Acute CVA (cerebrovascular accident) (HCC)       PLAN:  POST-PARTUM STROKE (03/05/21; wake up stroke; possibly from hypercoagulable post-partum state; left brain subcortical; h/o miscarriage x 1; tobacco and THC use) - continue aspirin 81mg  daily - follow up TEE (Dr. ) - check sickle cell / hemoglobinopathy screening  Orders Placed This Encounter  Procedures  . Sickle Cell Screen  . Hgb Fractionation Cascade   Return in about 3 months (around 06/10/2021).    06/12/2021, MD  03/11/2021, 2:45 PM Certified in Neurology, Neurophysiology and Neuroimaging  [facility] [address] [place], [place] [id number]

## 2021-03-15 LAB — HGB FRACTIONATION CASCADE
Hgb A2: 2.1 % (ref 1.8–3.2)
Hgb A: 97.9 % (ref 96.4–98.8)
Hgb F: 0 % (ref 0.0–2.0)
Hgb S: 0 %

## 2021-03-15 LAB — SICKLE CELL SCREEN: Sickle Cell Screen: NEGATIVE

## 2021-03-17 ENCOUNTER — Telehealth: Payer: Self-pay | Admitting: *Deleted

## 2021-03-17 LAB — ALPHA GALACTOSIDASE: Alpha galactosidase, serum: 73.8 nmol/hr/mg prt (ref >35.5)

## 2021-03-17 NOTE — Telephone Encounter (Signed)
Called patient and informed her labs are normal. Patient verbalized understanding, appreciation.

## 2021-03-24 ENCOUNTER — Other Ambulatory Visit: Payer: Self-pay

## 2021-03-24 ENCOUNTER — Other Ambulatory Visit
Admission: RE | Admit: 2021-03-24 | Discharge: 2021-03-24 | Disposition: A | Discharge status: Home / Self Care | Payer: Medicaid Other | Source: Ambulatory Visit | Attending: Cardiology | Admitting: Cardiology

## 2021-03-24 DIAGNOSIS — Z20822 Contact with and (suspected) exposure to covid-19: Secondary | ICD-10-CM | POA: Insufficient documentation

## 2021-03-24 DIAGNOSIS — Z01812 Encounter for preprocedural laboratory examination: Secondary | ICD-10-CM | POA: Insufficient documentation

## 2021-03-24 LAB — SARS CORONAVIRUS 2 (TAT 6-24 HRS): SARS Coronavirus 2: NEGATIVE

## 2021-03-26 ENCOUNTER — Ambulatory Visit
Admission: RE | Admit: 2021-03-26 | Discharge: 2021-03-26 | Disposition: A | Discharge status: Home / Self Care | Payer: Medicaid Other | Source: Home / Self Care | Attending: Cardiology | Admitting: Cardiology

## 2021-03-26 ENCOUNTER — Ambulatory Visit
Admission: RE | Admit: 2021-03-26 | Discharge: 2021-03-26 | Disposition: A | Discharge status: Home / Self Care | Payer: Medicaid Other | Attending: Cardiology | Admitting: Cardiology

## 2021-03-26 ENCOUNTER — Ambulatory Visit: Payer: Medicaid Other | Admitting: Anesthesiology

## 2021-03-26 ENCOUNTER — Other Ambulatory Visit: Payer: Self-pay

## 2021-03-26 ENCOUNTER — Encounter: Payer: Self-pay | Admitting: Cardiology

## 2021-03-26 ENCOUNTER — Encounter
Admission: RE | Disposition: A | Discharge status: Home / Self Care | Payer: Self-pay | Source: Home / Self Care | Attending: Cardiology

## 2021-03-26 DIAGNOSIS — D649 Anemia, unspecified: Secondary | ICD-10-CM | POA: Insufficient documentation

## 2021-03-26 DIAGNOSIS — R29810 Facial weakness: Secondary | ICD-10-CM | POA: Insufficient documentation

## 2021-03-26 DIAGNOSIS — I639 Cerebral infarction, unspecified: Secondary | ICD-10-CM | POA: Insufficient documentation

## 2021-03-26 DIAGNOSIS — Z91018 Allergy to other foods: Secondary | ICD-10-CM | POA: Insufficient documentation

## 2021-03-26 DIAGNOSIS — R471 Dysarthria and anarthria: Secondary | ICD-10-CM | POA: Diagnosis not present

## 2021-03-26 DIAGNOSIS — F419 Anxiety disorder, unspecified: Secondary | ICD-10-CM | POA: Diagnosis not present

## 2021-03-26 DIAGNOSIS — R4702 Dysphasia: Secondary | ICD-10-CM | POA: Insufficient documentation

## 2021-03-26 DIAGNOSIS — F32A Depression, unspecified: Secondary | ICD-10-CM | POA: Diagnosis not present

## 2021-03-26 DIAGNOSIS — R531 Weakness: Secondary | ICD-10-CM | POA: Diagnosis not present

## 2021-03-26 DIAGNOSIS — Z79899 Other long term (current) drug therapy: Secondary | ICD-10-CM | POA: Diagnosis not present

## 2021-03-26 DIAGNOSIS — R4781 Slurred speech: Secondary | ICD-10-CM | POA: Insufficient documentation

## 2021-03-26 DIAGNOSIS — Z87891 Personal history of nicotine dependence: Secondary | ICD-10-CM | POA: Insufficient documentation

## 2021-03-26 HISTORY — PX: TEE WITHOUT CARDIOVERSION: SHX5443

## 2021-03-26 SURGERY — ECHOCARDIOGRAM, TRANSESOPHAGEAL
Anesthesia: General

## 2021-03-26 MED ORDER — PROPOFOL 10 MG/ML IV BOLUS
INTRAVENOUS | Status: AC
  Filled 2021-03-26: qty 20

## 2021-03-26 MED ORDER — DEXMEDETOMIDINE (PRECEDEX) IN NS 20 MCG/5ML (4 MCG/ML) IV SYRINGE
PREFILLED_SYRINGE | INTRAVENOUS | Status: AC
  Filled 2021-03-26: qty 5

## 2021-03-26 MED ORDER — SODIUM CHLORIDE 0.9 % IV SOLN: INTRAVENOUS | Status: DC

## 2021-03-26 MED ORDER — LIDOCAINE VISCOUS HCL 2 % MT SOLN
OROMUCOSAL | Status: AC
  Filled 2021-03-26: qty 15

## 2021-03-26 MED ORDER — PROPOFOL 10 MG/ML IV BOLUS
INTRAVENOUS | Status: DC | PRN
  Administered 2021-03-26 (×2): 40 mg via INTRAVENOUS
  Administered 2021-03-26: 100 mg via INTRAVENOUS
  Administered 2021-03-26 (×2): 40 mg via INTRAVENOUS
  Administered 2021-03-26: 30 mg via INTRAVENOUS
  Administered 2021-03-26: 40 mg via INTRAVENOUS

## 2021-03-26 MED ORDER — DEXMEDETOMIDINE (PRECEDEX) IN NS 20 MCG/5ML (4 MCG/ML) IV SYRINGE
PREFILLED_SYRINGE | INTRAVENOUS | Status: DC | PRN
  Administered 2021-03-26: 4 ug via INTRAVENOUS
  Administered 2021-03-26: 16 ug via INTRAVENOUS

## 2021-03-26 NOTE — Procedures (Signed)
   TRANSESOPHAGEAL ECHOCARDIOGRAM   NAME:  Andrea Weiss   MRN: [id number] DOB:  [birth year]   ADMIT DATE: 03/26/2021  INDICATIONS:  Stroke PROCEDURE:  TEE Informed consent was obtained prior to the procedure. The risks, benefits and alternatives for the procedure were discussed and the patient comprehended these risks.  Risks include, but are not limited to, cough, sore throat, vomiting, nausea, somnolence, esophageal and stomach trauma or perforation, bleeding, low blood pressure, aspiration, pneumonia, infection, trauma to the teeth and death.    After a procedural time-out, the patient was given 330 cc iv propofol per department of anesthesia. The transesophageal probe was inserted in the esophagus and stomach without difficulty and multiple views were obtained. I was present for the entire procedure.    COMPLICATIONS:    There were no immediate complications.  FINDINGS:  LEFT VENTRICLE: EF = 55%. No regional wall motion abnormalities.  RIGHT VENTRICLE: Normal size and function.   LEFT ATRIUM: Normal size  LEFT ATRIAL APPENDAGE: No thrombus. Good doppler flow  RIGHT ATRIUM: Normal  AORTIC VALVE:  Trileaflet. No AI or vegetations or thrombus  MITRAL VALVE:    Normal. Trivial mr. No thrombus  TRICUSPID VALVE: Normal.  PULMONIC VALVE: Grossly normal.  INTERATRIAL SEPTUM: No PFO or ASD. Agitated saline contrast was used.   PERICARDIUM: No effusion  DESCENDING AORTA: Normal  CONCLUSION: Normal tee. NO evidence of thrombus or cardiac source of emboli.

## 2021-03-26 NOTE — Anesthesia Procedure Notes (Signed)
Performed by: Oaklee Esther, CRNA Pre-anesthesia Checklist: Patient identified, Emergency Drugs available, Suction available and Patient being monitored Patient Re-evaluated:Patient Re-evaluated prior to induction Oxygen Delivery Method: Nasal cannula Induction Type: IV induction Dental Injury: Teeth and Oropharynx as per pre-operative assessment  Comments: Nasal cannula with etCO2 monitoring       

## 2021-03-26 NOTE — Anesthesia Preprocedure Evaluation (Signed)
Anesthesia Evaluation  Patient identified by MRN, date of birth, ID band Patient awake    Reviewed: Allergy & Precautions, NPO status , Patient's Chart, lab work & pertinent test results  History of Anesthesia Complications Negative for: history of anesthetic complications  Airway Mallampati: II  TM Distance: >3 FB Neck ROM: Full    Dental  (+) Teeth Intact One tooth on left back maxillary molar that patient needs to get removed, but it is not loose:   Pulmonary neg sleep apnea, neg COPD, Current Smoker and Patient abstained from smoking.,    Pulmonary exam normal breath sounds clear to auscultation       Cardiovascular Exercise Tolerance: Good METS(-) hypertension(-) CAD and (-) Past MI negative cardio ROS  (-) dysrhythmias  Rhythm:Regular Rate:Normal - Systolic murmurs    Neuro/Psych PSYCHIATRIC DISORDERS Anxiety Depression Post-partum stroke, no residual symptoms CVA, No Residual Symptoms    GI/Hepatic neg GERD  ,(+)     substance abuse  marijuana use,   Endo/Other  neg diabetes  Renal/GU negative Renal ROS     Musculoskeletal   Abdominal   Peds  Hematology   Anesthesia Other Findings Past Medical History: No date: Abdominal hernia No date: Anemia No date: Anxiety No date: Depression No date: History of anemia No date: History of pica No date: History of urinary tract infection No date: Medical history non-contributory  Reproductive/Obstetrics Two months post partum                             Anesthesia Physical Anesthesia Plan  ASA: II  Anesthesia Plan: General   Post-op Pain Management:    Induction: Intravenous  PONV Risk Score and Plan: 2 and Ondansetron, Propofol infusion, TIVA and Midazolam  Airway Management Planned: Nasal Cannula  Additional Equipment: None  Intra-op Plan:   Post-operative Plan:   Informed Consent: I have reviewed the patients History  and Physical, chart, labs and discussed the procedure including the risks, benefits and alternatives for the proposed anesthesia with the patient or authorized representative who has indicated his/her understanding and acceptance.     Dental advisory given  Plan Discussed with: CRNA and Surgeon  Anesthesia Plan Comments: (Discussed risks of anesthesia with patient, including possibility of difficulty with spontaneous ventilation under anesthesia necessitating airway intervention, PONV, and rare risks such as cardiac or respiratory or neurological events. Patient understands. Patient counseled on benefits of smoking cessation, and increased perioperative risks associated with continued smoking. )        Anesthesia Quick Evaluation

## 2021-03-26 NOTE — Anesthesia Postprocedure Evaluation (Signed)
Anesthesia Post Note  Patient: Andrea Weiss  Procedure(s) Performed: TRANSESOPHAGEAL ECHOCARDIOGRAM (TEE) (N/A )  Patient location during evaluation: Specials Recovery Anesthesia Type: General Level of consciousness: awake and alert Pain management: pain level controlled Vital Signs Assessment: post-procedure vital signs reviewed and stable Respiratory status: spontaneous breathing, nonlabored ventilation, respiratory function stable and patient connected to nasal cannula oxygen Cardiovascular status: blood pressure returned to baseline and stable Postop Assessment: no apparent nausea or vomiting Anesthetic complications: no   No complications documented.   Last Vitals:  Vitals:   03/26/21 0801 03/26/21 0802  BP:    Pulse: 65 71  Resp: (!) 22 17  Temp:    SpO2: 100% 100%    Last Pain:  Vitals:   03/26/21 0723  TempSrc: Oral  PainSc: 0-No pain                 Corinda Gubler

## 2021-03-26 NOTE — Transfer of Care (Signed)
Immediate Anesthesia Transfer of Care Note  Patient: Andrea Weiss  Procedure(s) Performed: TRANSESOPHAGEAL ECHOCARDIOGRAM (TEE) (N/A )  Patient Location: PACU and Special Procedures Rm 3  Anesthesia Type:General  Level of Consciousness: awake and drowsy  Airway & Oxygen Therapy: Patient Spontanous Breathing and Patient connected to nasal cannula oxygen  Post-op Assessment: Report given to RN and Post -op Vital signs reviewed and stable  Post vital signs: Reviewed and stable  Last Vitals:  Vitals Value Taken Time  BP 140/92 03/26/21 0800  Temp    Pulse 71 03/26/21 0802  Resp 17 03/26/21 0802  SpO2 100 % 03/26/21 0802    Last Pain:  Vitals:   03/26/21 0723  TempSrc: Oral  PainSc: 0-No pain      Patients Stated Pain Goal: 0 (03/26/21 0723)  Complications: No complications documented.

## 2021-03-26 NOTE — Progress Notes (Signed)
*  PRELIMINARY RESULTS* Echocardiogram Echocardiogram Transesophageal has been performed.  Andrea Weiss 03/26/2021, 8:21 AM

## 2021-03-26 NOTE — H&P (Signed)
25 y.o. female with medical history significant for anxiety and depression, anemia who is 2-week postpartum, and presents to the emergency room with acute onset of right facial droop and left upper extremity weakness without paresthesias.  She admits to dysarthria and expressive dysphasia.  She has been having headache without dizziness or blurred vision or diplopia.  No vertigo or tinnitus.  No urinary or stool incontinence.  No witnessed seizures.  No dysphagia.  The patient denied any chest pain or palpitations, cough or wheezing or dyspnea or hemoptysis.  No other bleeding diathesis. ED Course: When she came to the ER blood pressure was 136/95 with otherwise normal vital signs.  Labs revealed unremarkable CMP and CBC.Marland Kitchen  Urine pregnancy test was negative.  Respiratory panel is currently pending.  UA showed 11-20 WBCs with positive mucus and trace leukocyte esterase. EKG as reviewed by me : Is pending. Imaging: No acute abnormalities.  Intracranial MRA came back normal.  Brain MRI with and without contrast still revealed small acute infarct of the left lentiform nucleus adjacent to the posterior limb of the left internal capsule with no hemorrhage or mass-effect.  C-spine MRI revealed reversal of normal cervical lordosis may be positional or due to muscle spasm.  MRI was otherwise normal.  The patient was given 0.5 mg of IV Ativan twice and additional 1 mg IV and 4 baby aspirin.  She will be admitted to a medical monitored bed for further evaluation and management. PAST MEDICAL HISTORY:       Past Medical History:  Diagnosis Date  . Abdominal hernia   . Anemia   . Anxiety   . Depression   . History of anemia   . History of pica   . History of urinary tract infection   . Medical history non-contributory     PAST SURGICAL HISTORY:        Past Surgical History:  Procedure Laterality Date  . Nexplanon insertion    . WISDOM TOOTH EXTRACTION Bilateral     SOCIAL HISTORY:    Social History        Tobacco Use  . Smoking status: Former Smoker    Packs/day: 0.25    Types: Cigarettes    Quit date: 2021    Years since quitting: 1.2  . Smokeless tobacco: Never Used  Substance Use Topics  . Alcohol use: Not Currently    Comment: quit when she found out she was pregnant, was occassioanl    FAMILY HISTORY:  She denied any familial diseases.  DRUG ALLERGIES:        Allergies  Allergen Reactions  . Mango Flavor Hives and Swelling    Allergy to mangos    REVIEW OF SYSTEMS:   ROS As per history of present illness. All pertinent systems were reviewed above. Constitutional, HEENT, cardiovascular, respiratory, GI, GU, musculoskeletal, neuro, psychiatric, endocrine, integumentary and hematologic systems were reviewed and are otherwise negative/unremarkable except for positive findings mentioned above in the HPI.   MEDICATIONS AT HOME:          Prior to Admission medications   Medication Sig Start Date End Date Taking? Authorizing Provider  acetaminophen (TYLENOL) 500 MG tablet Take 2 tablets (1,000 mg total) by mouth every 6 (six) hours as needed (for pain scale < 4). 02/08/21   McVey, Prudencio Pair, CNM  benzocaine-Menthol (DERMOPLAST) 20-0.5 % AERO Apply 1 application topically as needed for irritation (perineal discomfort). 02/08/21   McVey, Prudencio Pair, CNM  cetirizine (ZYRTEC) 10 MG tablet Take 1  tablet (10 mg total) by mouth daily. Patient not taking: No sig reported 12/27/17   Marylene Land, CNM  coconut oil OIL Apply 1 application topically as needed. 02/08/21   McVey, Prudencio Pair, CNM  docusate sodium (COLACE) 100 MG capsule Take 1 capsule (100 mg total) by mouth 2 (two) times daily. 02/08/21   McVey, Prudencio Pair, CNM  ferrous sulfate 325 (65 FE) MG tablet Take 1 tablet (325 mg total) by mouth 2 (two) times daily with a meal. 02/08/21   McVey, Prudencio Pair, CNM  ibuprofen (ADVIL) 600 MG tablet Take 1 tablet (600 mg  total) by mouth every 6 (six) hours. 02/08/21   McVey, Prudencio Pair, CNM  Prenatal Vit-Fe Fumarate-FA (MULTIVITAMIN-PRENATAL) 27-0.8 MG TABS tablet Take 2 tablets by mouth daily at 12 noon. 2 gummies per day Patient not taking: Reported on 02/06/2021    [provider]  sertraline (ZOLOFT) 25 MG tablet Take 25 mg by mouth daily.    [provider]  simethicone (MYLICON) 80 MG chewable tablet Chew 1 tablet (80 mg total) by mouth as needed for flatulence. 02/08/21   McVey, Prudencio Pair, CNM  witch hazel-glycerin (TUCKS) pad Apply 1 application topically continuous. 02/08/21   McVey, Prudencio Pair, CNM      VITAL SIGNS:  Blood pressure (!) 131/104, pulse 70, temperature 98.6 F (37 C), temperature source Oral, resp. rate 16, height 5\' 4"  (1.626 m), weight 61.2 kg, SpO2 100 %, unknown if currently breastfeeding.  PHYSICAL EXAMINATION:  Physical Exam  GENERAL:  25 y.o.-year-old African-American female patient lying in the bed with no acute distress.  EYES: Pupils equal, round, reactive to light and accommodation. No scleral icterus. Extraocular muscles intact.  HEENT: Head atraumatic, normocephalic. Oropharynx and nasopharynx clear.  NECK:  Supple, no jugular venous distention. No thyroid enlargement, no tenderness.  LUNGS: Normal breath sounds bilaterally, no wheezing, rales,rhonchi or crepitation. No use of accessory muscles of respiration.  CARDIOVASCULAR: Regular rate and rhythm, S1, S2 normal. No murmurs, rubs, or gallops.  ABDOMEN: Soft, nondistended, nontender. Bowel sounds present. No organomegaly or mass.  EXTREMITIES: No pedal edema, cyanosis, or clubbing.  NEUROLOGIC: Cranial nerves II through XII are intact except for right facial droop. Muscle strength 5/5 in all extremities except the left upper extremity that was 3-4/5 muscle strength.  She had mild left dysmetria on finger to finger and finger-to-nose test.  She had normal heel-to-shin test.  Sensation intact.  Gait not checked.  PSYCHIATRIC: The patient is alert and oriented x 3.  Normal affect and good eye contact. SKIN: No obvious rash, lesion, or ulcer.   LABORATORY PANEL:   CBC Last Labs      Recent Labs  Lab 03/05/21 2040  WBC 4.4  HGB 13.0  HCT 41.6  PLT 212     ------------------------------------------------------------------------------------------------------------------  Chemistries  Last Labs      Recent Labs  Lab 03/05/21 2040  NA 138  K 3.8  CL 105  CO2 25  GLUCOSE 106*  BUN 8  CREATININE 0.65  CALCIUM 9.1  AST 20  ALT 22  ALKPHOS 79  BILITOT 0.8     ------------------------------------------------------------------------------------------------------------------  Cardiac Enzymes Last Labs   No results for input(s): TROPONINI in the last 168 hours.   ------------------------------------------------------------------------------------------------------------------  RADIOLOGY:   Imaging Results (Last 48 hours)  CT Head Wo Contrast  Result Date: 03/05/2021 CLINICAL DATA:  One month postpartum. Slurred speech, right arm weakness. EXAM: CT HEAD WITHOUT CONTRAST TECHNIQUE: Contiguous axial images were obtained  from the base of the skull through the vertex without intravenous contrast. COMPARISON:  None. FINDINGS: Brain: No acute intracranial abnormality. Specifically, no hemorrhage, hydrocephalus, mass lesion, acute infarction, or significant intracranial injury. Vascular: No hyperdense vessel or unexpected calcification. Skull: No acute calvarial abnormality. Sinuses/Orbits: Visualized paranasal sinuses and mastoids clear. Orbital soft tissues unremarkable. Other: None IMPRESSION: Normal study. Electronically Signed   By: Charlett NoseKevin  Dover M.D.   On: 03/05/2021 20:51   MR ANGIO HEAD WO CONTRAST  Result Date: 03/06/2021 CLINICAL DATA:  Unilateral weakness EXAM: MRA HEAD WITHOUT CONTRAST TECHNIQUE: Angiographic images of the Circle of Willis were  obtained using MRA technique without intravenous contrast. COMPARISON:  None. FINDINGS: POSTERIOR CIRCULATION: --Vertebral arteries: Normal --Inferior cerebellar arteries: Normal. --Basilar artery: Normal. --Superior cerebellar arteries: Normal. --Posterior cerebral arteries: Normal. ANTERIOR CIRCULATION: --Intracranial internal carotid arteries: Normal. --Anterior cerebral arteries (ACA): Normal. --Middle cerebral arteries (MCA): Normal. ANATOMIC VARIANTS: Both P comms are present. IMPRESSION: Normal intracranial MRA. Electronically Signed   By: Deatra RobinsonKevin  Herman M.D.   On: 03/06/2021 00:10   MR Brain W and Wo Contrast  Result Date: 03/06/2021 CLINICAL DATA:  Slurred speech and facial numbness EXAM: MRI HEAD WITHOUT AND WITH CONTRAST TECHNIQUE: Multiplanar, multiecho pulse sequences of the brain and surrounding structures were obtained without and with intravenous contrast. CONTRAST:  7.785mL GADAVIST GADOBUTROL 1 MMOL/ML IV SOLN COMPARISON:  Head CT 03/05/2021 FINDINGS: Brain: There is a focus of abnormal diffusion restriction in the left lentiform nucleus adjacent to the posterior limb of the left internal capsule. No acute or chronic hemorrhage. Mild edema at the site of diffusion restriction but otherwise normal white matter signal, parenchymal volume and CSF spaces. The midline structures are normal. There is no abnormal contrast enhancement. Vascular: Major flow voids are preserved. Skull and upper cervical spine: Normal calvarium and skull base. Visualized upper cervical spine and soft tissues are normal. Sinuses/Orbits:No paranasal sinus fluid levels or advanced mucosal thickening. No mastoid or middle ear effusion. Normal orbits. 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 No hemorrhage or mass effect. Electronically Signed   By: Deatra RobinsonKevin  Herman M.D.   On: 03/06/2021 00:08   MR Cervical Spine W or Wo Contrast  Result Date:  03/06/2021 CLINICAL DATA:  Unilateral weakness and slurred speech EXAM: MRI CERVICAL SPINE WITHOUT AND WITH CONTRAST TECHNIQUE: Multiplanar and multiecho pulse sequences of the cervical spine, to include the craniocervical junction and cervicothoracic junction, were obtained without and with intravenous contrast. CONTRAST:  7.515mL GADAVIST GADOBUTROL 1 MMOL/ML IV SOLN COMPARISON:  None. FINDINGS: Alignment: Physiologic. Reversal of normal cervical lordosis may be positional or due to muscle spasm. Vertebrae: No fracture, evidence of discitis, or bone lesion. Cord: Normal signal and morphology. Posterior Fossa, vertebral arteries, paraspinal tissues: Negative. Disc levels: No spinal canal or neural foraminal stenosis. IMPRESSION: Reversal of normal cervical lordosis may be positional or due to muscle spasm. Otherwise normal MRI of the cervical spine. Electronically Signed   By: Deatra RobinsonKevin  Herman M.D.   On: 03/06/2021 00:09       IMPRESSION AND PLAN:  Active Problems:   Acute CVA (cerebrovascular accident) (HCC)  1.  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 - TEE to evaluate for evidence of cardiac source of emboli.   2.  Slurred speech/dysarthria, expressive dysphasia, left upper extremity weakness and right facial droop.  3.  Depression. - We  will continue Zoloft.  4.  Anemia. - We will continue ferrous sulfate.  Harold Hedge MD  Pt seen and examined. No change form above.

## 2021-03-29 ENCOUNTER — Encounter: Payer: Self-pay | Admitting: Cardiology

## 2021-05-20 ENCOUNTER — Ambulatory Visit: Payer: Medicaid Other | Admitting: Neurology

## 2021-06-09 ENCOUNTER — Ambulatory Visit: Payer: Medicaid Other | Admitting: Diagnostic Neuroimaging

## 2021-06-09 ENCOUNTER — Encounter: Payer: Self-pay | Admitting: Diagnostic Neuroimaging

## 2021-06-09 VITALS — BP 117/75 | HR 99 | Ht 67.0 in | Wt 118.0 lb

## 2021-06-09 DIAGNOSIS — I639 Cerebral infarction, unspecified: Secondary | ICD-10-CM | POA: Diagnosis not present

## 2021-06-09 NOTE — Progress Notes (Signed)
[facility]  PATIENT: Andrea Weiss DOB: 03-Sep-1996  REFERRING CLINICIAN: Department, [place] [facility] HISTORY FROM: patient  REASON FOR VISIT:follow up   HISTORICAL  CHIEF COMPLAINT:  Chief Complaint  Patient presents with   Follow-up    Rm 7 with fiance. Reports- she has been doing/feeling well since her last f/u appt.     HISTORY OF PRESENT ILLNESS:   UPDATE (06/09/21, VRP): Since last visit, doing well. Had TEE and heart monitor and were negative. No new or recurrent symptoms.   PRIOR HPI: 25 year old female here for evaluation of stroke.  03/05/2021 patient woke up with word finding difficulties and dropping things on the right hand.  She went to bed the night before around midnight and was in normal health.  She was about 2 weeks postpartum when this occurred.  Symptoms continued throughout the day and patient went to the hospital for evaluation.  She was admitted and found to have small left basal ganglia ischemic infarction.  Stroke work-up was completed.  No specific cause was found other than possible hypercoagulable state related to recent pregnancy.  She did have a history of miscarriage x1 in the past.  She has been smoking cigarettes and using marijuana occasionally for past few years.  Her stroke symptoms continued for about 1 to 2 days and have gradually resolved.  Today patient does not have any residual stroke symptoms.  She and her baby are doing well at home.    REVIEW OF SYSTEMS: Full 14 system review of systems performed and negative with exception of: As per HPI.  ALLERGIES: Allergies  Allergen Reactions   Mango Flavor Hives and Swelling    Allergy to [facility] MEDICATIONS: Outpatient Medications Prior to Visit  Medication Sig Dispense Refill   acetaminophen (TYLENOL) 500 MG tablet Take 2 tablets (1,000 mg total) by mouth every 6 (six) hours as needed (for pain scale < 4). 30 tablet 0   aspirin EC 81 MG tablet Take 1 tablet (81  mg total) by mouth daily. Swallow whole. 30 tablet 11   benzocaine-Menthol (DERMOPLAST) 20-0.5 % AERO Apply 1 application topically as needed for irritation (perineal discomfort).     cetirizine (ZYRTEC) 10 MG tablet Take 1 tablet (10 mg total) by mouth daily. 14 tablet 2   coconut oil OIL Apply 1 application topically as needed.  0   Prenatal Vit-Fe Fumarate-FA (MULTIVITAMIN-PRENATAL) 27-0.8 MG TABS tablet Take 1 tablet by mouth daily.     witch hazel-glycerin (TUCKS) pad Apply 1 application topically continuous. 40 each 12   docusate sodium (COLACE) 100 MG capsule Take 1 capsule (100 mg total) by mouth 2 (two) times daily. (Patient not taking: Reported on 06/09/2021) 10 capsule 0   ferrous sulfate 325 (65 FE) MG tablet Take 1 tablet (325 mg total) by mouth 2 (two) times daily with a meal. (Patient not taking: Reported on 06/09/2021) 60 tablet 1   No facility-administered medications prior to visit.    PAST MEDICAL HISTORY: Past Medical History:  Diagnosis Date   Abdominal hernia    Anemia    Anxiety    Depression    History of anemia    History of pica    History of urinary tract infection    Medical history non-contributory     PAST SURGICAL HISTORY: Past Surgical History:  Procedure Laterality Date   Nexplanon insertion     TEE WITHOUT CARDIOVERSION N/A 03/26/2021   Procedure: TRANSESOPHAGEAL ECHOCARDIOGRAM (TEE);  Surgeon: Harold Hedge  A, MD;  Location: [facility] ORS;  Service: Cardiovascular;  Laterality: N/A;   WISDOM TOOTH EXTRACTION Bilateral     FAMILY HISTORY: No family history on file.  SOCIAL HISTORY: Social History   Socioeconomic History   Marital status: Single    Spouse name: Chris/fiance   Number of children: 1   Years of education: Not on file   Highest education level: Not on file  Occupational History   Not on file  Tobacco Use   Smoking status: Former    Packs/day: 0.25    Types: Cigarettes    Quit date: 2021    Years since quitting: 1.5   Smokeless  tobacco: Never  Vaping Use   Vaping Use: Former   Quit date: 12/28/2019  Substance and Sexual Activity   Alcohol use: Not Currently    Comment: quit when she found out she was pregnant, was occassioanl   Drug use: Not Currently    Types: Marijuana    Comment: last MJ use was 2 weeks ago   Sexual activity: Yes    Birth control/protection: I.U.D.  Other Topics Concern   Not on file  Social History Narrative   Not on file   Social Determinants of Health   Financial Resource Strain: Not on file  Food Insecurity: Not on file  Transportation Needs: Not on file  Physical Activity: Not on file  Stress: Not on file  Social Connections: Not on file  Intimate Partner Violence: Not on file     PHYSICAL EXAM  GENERAL EXAM/CONSTITUTIONAL: Vitals:  Vitals:   06/09/21 1428  BP: 117/75  Pulse: 99  Weight: 118 lb (53.5 kg)  Height: 5\' 7"  (1.702 m)   Body mass index is 18.48 kg/m. Wt Readings from Last 3 Encounters:  06/09/21 118 lb (53.5 kg)  03/26/21 127 lb (57.6 kg)  03/11/21 121 lb 6 oz (55.1 kg)   Patient is in no distress; well developed, nourished and groomed; neck is supple  CARDIOVASCULAR: Examination of carotid arteries is normal; no carotid bruits Regular rate and rhythm, no murmurs Examination of peripheral vascular system by observation and palpation is normal  EYES: Ophthalmoscopic exam of optic discs and posterior segments is normal; no papilledema or hemorrhages No results found.  MUSCULOSKELETAL: Gait, strength, tone, movements noted in Neurologic exam below  NEUROLOGIC: MENTAL STATUS:  No flowsheet data found. awake, alert, oriented to person, place and time recent and remote memory intact normal attention and concentration language fluent, comprehension intact, naming intact fund of knowledge appropriate  CRANIAL NERVE:  2nd - no papilledema on fundoscopic exam 2nd, 3rd, 4th, 6th - pupils equal and reactive to light, visual fields full to  confrontation, extraocular muscles intact, no nystagmus 5th - facial sensation symmetric 7th - facial strength symmetric 8th - hearing intact 9th - palate elevates symmetrically, uvula midline 11th - shoulder shrug symmetric 12th - tongue protrusion midline  MOTOR:  normal bulk and tone, full strength in the BUE, BLE  SENSORY:  normal and symmetric to light touch, temperature, vibration  COORDINATION:  finger-nose-finger, fine finger movements normal  REFLEXES:  deep tendon reflexes present and symmetric  GAIT/STATION:  narrow based gait     DIAGNOSTIC DATA (LABS, IMAGING, TESTING) - I reviewed patient records, labs, notes, testing and imaging myself where available.  Lab Results  Component Value Date   WBC 4.5 03/08/2021   HGB 13.4 03/08/2021   HCT 41.3 03/08/2021   MCV 80.0 03/08/2021   PLT 193 03/08/2021  Component Value Date/Time   NA 136 03/08/2021 0402   K 3.8 03/08/2021 0402   CL 102 03/08/2021 0402   CO2 24 03/08/2021 0402   GLUCOSE 83 03/08/2021 0402   BUN 8 03/08/2021 0402   CREATININE 0.64 03/08/2021 0402   CALCIUM 9.2 03/08/2021 0402   PROT 7.6 03/05/2021 2040   ALBUMIN 4.1 03/05/2021 2040   AST 20 03/05/2021 2040   ALT 22 03/05/2021 2040   ALKPHOS 79 03/05/2021 2040   BILITOT 0.8 03/05/2021 2040   GFRNONAA >60 03/08/2021 0402   GFRAA >60 03/28/2020 1030   Lab Results  Component Value Date   CHOL 162 03/06/2021   HDL 68 03/06/2021   LDLCALC 76 03/06/2021   TRIG 89 03/06/2021   CHOLHDL 2.4 03/06/2021   Lab Results  Component Value Date   HGBA1C 5.4 03/06/2021   Lab Results  Component Value Date   VITAMINB12 260 03/06/2021   Lab Results  Component Value Date   TSH 1.967 03/06/2021      Ref Range & Units 5 d ago  Anti Nuclear Antibody (ANA) Negative Negative        Ref Range & Units 5 d ago  C-ANCA Neg:<1:20 titer <1:20   P-ANCA Neg:<1:20 titer <1:20        Ref Range & Units 5 d ago  Myeloperoxidase Abs 0.0 - 9.0  U/mL <9.0   ANCA Proteinase 3 0.0 - 3.5 U/mL <3.5 VC       Ref Range & Units 5 d ago  Anticardiolipin IgA 0 - 11 APL U/mL <9   Comment: (NOTE)                           Negative:              <12                           Indeterminate:     12 - 20                           Low-Med Positive: >20 - 80                           High Positive:         >80  Performed At: [facility]   [address] [place], [place] [id number]  Jolene Schimke MD QM:[id number]   Anticardiolipin IgG 0 - 14 GPL U/mL <9   Comment: (NOTE)                           Negative:              <15                           Indeterminate:     15 - 20                           Low-Med Positive: >20 - 80                           High Positive:         >80   Anticardiolipin IgM 0 - 12  MPL U/mL <9   Comment: (NOTE)                           Negative:              <13                           Indeterminate:     13 - 20                           Low-Med Positive: >20 - 80                           High Positive:         >80   PTT Lupus Anticoagulant 0.0 - 51.9 sec 46.1   DRVVT 0.0 - 47.0 sec 30.4   Phosphatydalserine, IgG 0 - 30 Units 9   Phosphatydalserine, IgM 0 - 30 Units 18   Phosphatydalserine, IgA 0 - 19 APS Units <1   Lupus Anticoag Interp  Comment: VC   Comment: No lupus anticoagulant was detected.      Ref Range & Units 5 d ago  Protein C Activity 73 - 180 % 110        Ref Range & Units 5 d ago  Protein S Activity 63 - 140 % 70        Ref Range & Units 5 d ago  AntiThromb III Func 75 - 120 % 109       03/11/21 labs Sickle Cell Screen Negative Negative    Hgb F 0.0 - 2.0 % 0.0   Hgb A 96.4 - 98.8 % 97.9   Hgb A2 1.8 - 3.2 % 2.1   Hgb S 0.0 % 0.0   Interpretation, Hgb Fract  Comment   Comment: Normal hemoglobin present; no hemoglobin variant or beta thalassemia  identified.      03/05/21 MRI brain [I reviewed images myself and agree with interpretation. -VRP]  Small acute  infarct of the left lentiform nucleus adjacent to the posterior limb of the left internal capsule. No hemorrhage or mass Effect.  03/05/21 MRA head - Normal intracranial MRA.  03/06/21 carotid u/s - No evidence of significant internal carotid artery stenosis.   03/08/21 TTE  1. Left ventricular ejection fraction, by estimation, is 55 to 60%. The  left ventricle has normal function. The left ventricle has no regional  wall motion abnormalities. Left ventricular diastolic parameters were  normal.   2. Right ventricular systolic function is normal. The right ventricular  size is normal.   3. The mitral valve is grossly normal. No evidence of mitral valve  regurgitation.   4. The aortic valve is tricuspid. Aortic valve regurgitation is not  visualized.   03/26/21 TEE 1. Left ventricular ejection fraction, by estimation, is 60 to 65%. The  left ventricle has normal function. The left ventricle has no regional  wall motion abnormalities. Left ventricular diastolic parameters were  normal.   2. Right ventricular systolic function is normal. The right ventricular  size is normal.   3. No left atrial/left atrial appendage thrombus was detected.   4. The mitral valve is normal in structure. No evidence of mitral valve  regurgitation.   5. The aortic valve is tricuspid. Aortic valve regurgitation is not  visualized.  ASSESSMENT AND PLAN  25 y.o. year old female here with:  Dx:  1. Left basal ganglia embolic stroke (HCC)        PLAN:  POST-PARTUM STROKE (03/05/21; wake up stroke; possibly from hypercoagulable post-partum state; left brain subcortical; h/o miscarriage x 1; tobacco and THC use) - continue aspirin 81mg  daily  Return for return to PCP, pending if symptoms worsen or fail to improve.    , MD 06/09/2021, 2:51 PM Certified in Neurology, Neurophysiology and Neuroimaging  [facility] [address], [address] [place], [place]  [place] [id number]

## 2022-03-06 IMAGING — MR MR HEAD WO/W CM
14 series · 40 of 48 positions shown · IV contrast (gadavist)
Comparison: Head CT 03/05/2021

CLINICAL DATA: Slurred speech and facial numbness

EXAM:
MRI HEAD WITHOUT AND WITH CONTRAST
TECHNIQUE: Multiplanar, multiecho pulse sequences of the brain and surrounding
structures were obtained without and with intravenous contrast.
CONTRAST:  7.5mL GADAVIST GADOBUTROL 1 MMOL/ML IV SOLN

[Series 5: ax dwi_tracew · axial · 3.0mm · 0.65mm/px · z∈[-118,+25]mm · 2 of 48 slices shown]
[im 1/48]
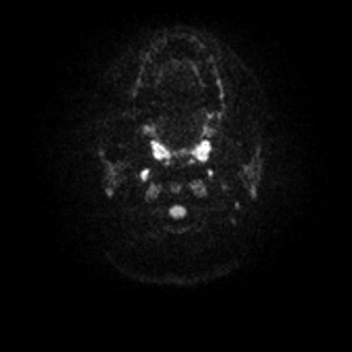
[im 48/48]
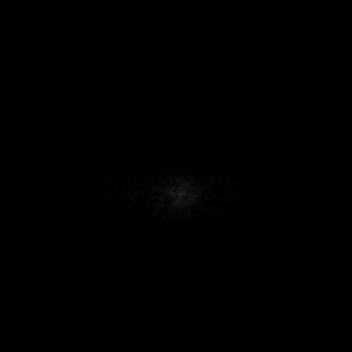

[Series 6: ax dwi_adc · axial · 3.0mm · 0.65mm/px · z∈[-118,+22]mm · 2 of 47 slices shown]
[im 1/47]
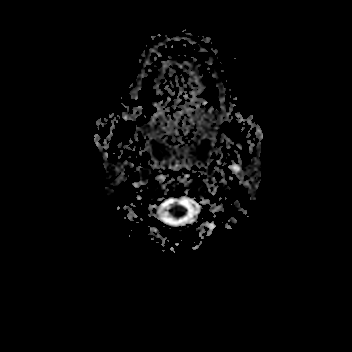
[im 47/47]
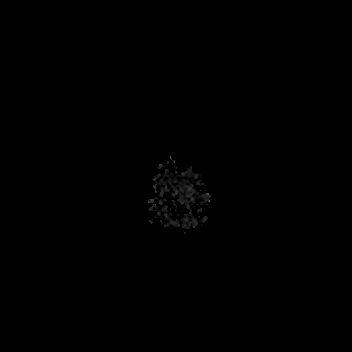

[Series 7: cor dwi_tracew · coronal · 5.0mm · 0.60mm/px · 2 of 38 slices shown]
[im 1/38]
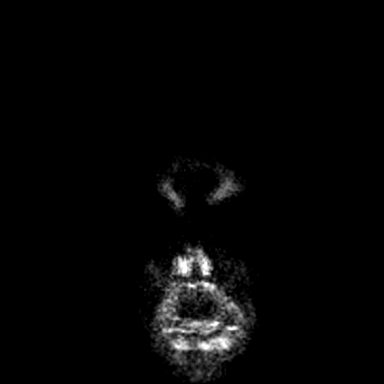
[im 38/38]
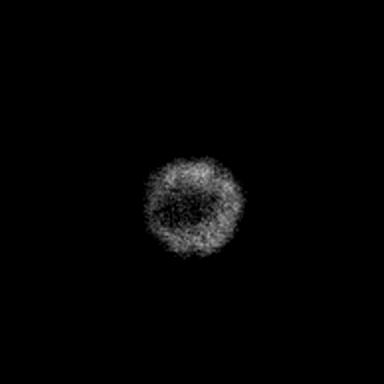

[Series 8: cor dwi_adc · coronal · 5.0mm · 0.60mm/px · 2 of 38 slices shown]
[im 1/38]
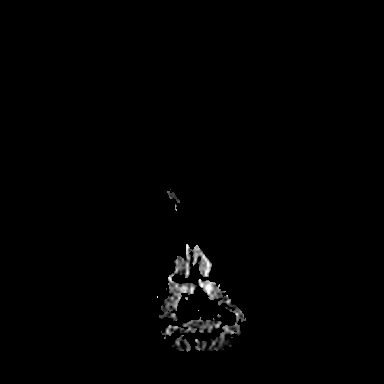
[im 38/38]
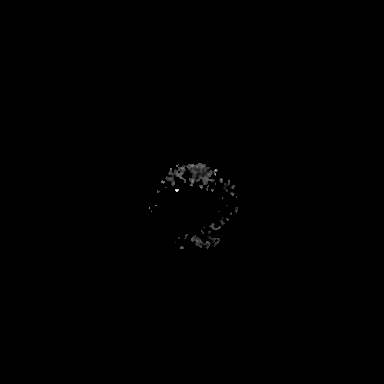

[Series 9: T1 · sagittal · 5.0mm · 0.62mm/px · 1 of 21 slices shown (1 of 2)]
[im 1/21]
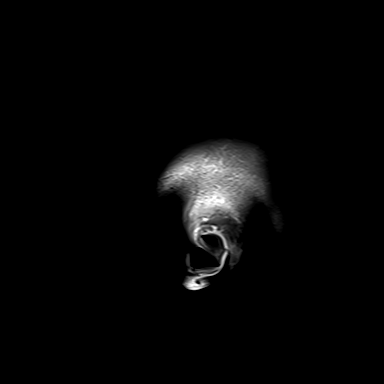

[Series 10: T2 · axial · 5.0mm · 0.45mm/px · 1 of 25 slices shown (1 of 2)]
[im 1/25]
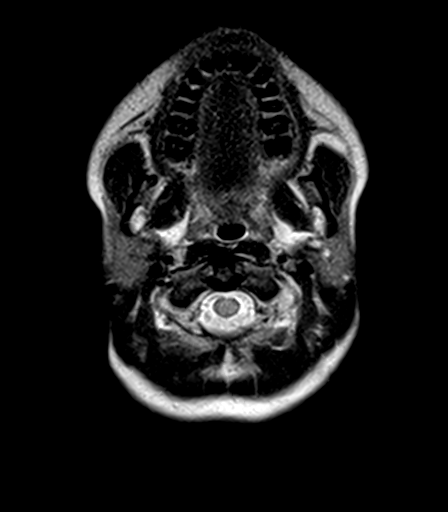

[Series 12: pha_images · axial · 3.0mm · 0.90mm/px · z∈[-117,+20]mm · 3 of 51 slices shown]
[im 1/51]
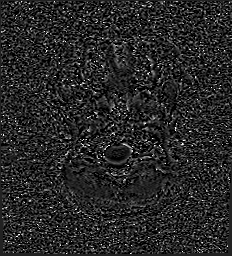
[im 26/51]
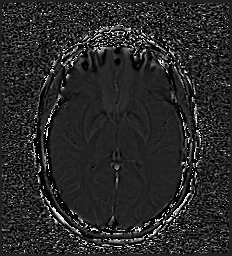
[im 51/51]
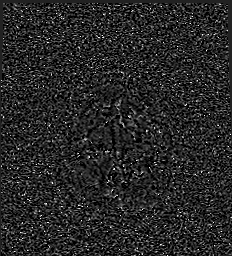

[Series 13: swi_images · axial · 3.0mm · 0.90mm/px · z∈[-117,+23]mm · 3 of 52 slices shown]
[im 1/52]
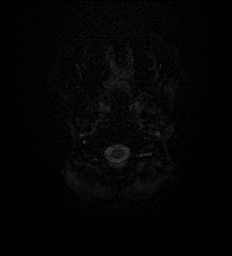
[im 26/52]
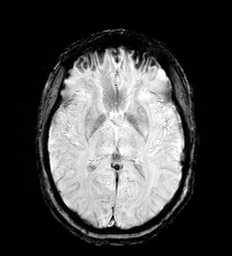
[im 52/52]
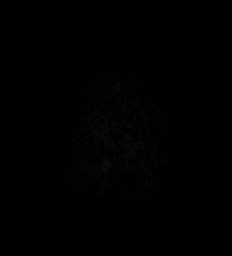

[Series 15: FLAIR · axial · 3.0mm · 0.53mm/px · z∈[-112,+23]mm · 3 of 50 slices shown]
[im 1/50]
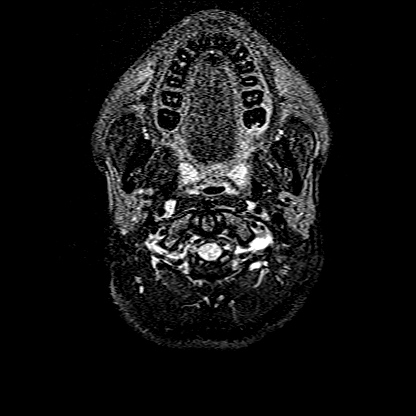
[im 25/50]
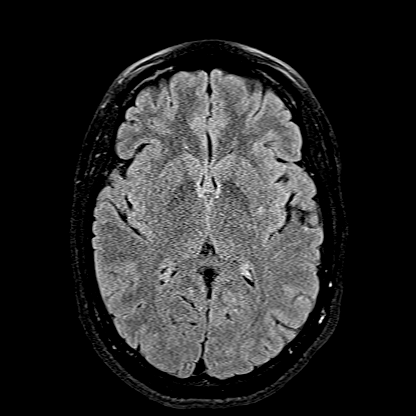
[im 50/50]
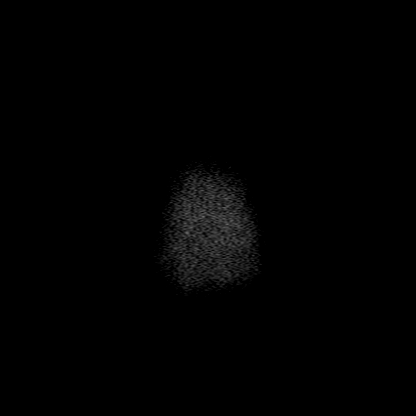

[Series 16: T1 · axial · 1.0mm · 0.98mm/px · z∈[-116,+15]mm · 7 of 144 slices shown (2 of 2)]
[im 1/144]
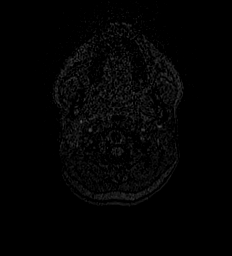
[im 24/144]
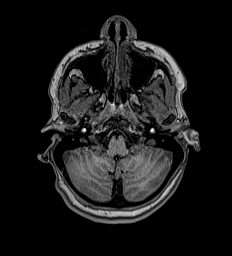
[im 48/144]
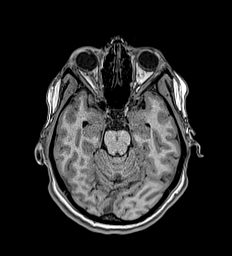
[im 72/144]
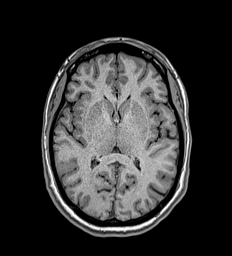
[im 96/144]
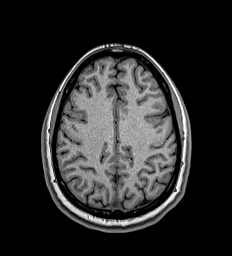
[im 120/144]
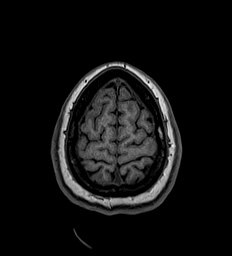
[im 144/144]
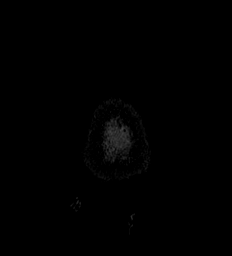

[Series 17: TOF · axial · 0.5mm · 0.41mm/px · z∈[-97,-68]mm · 3 of 205 slices shown]
[im 1/205]
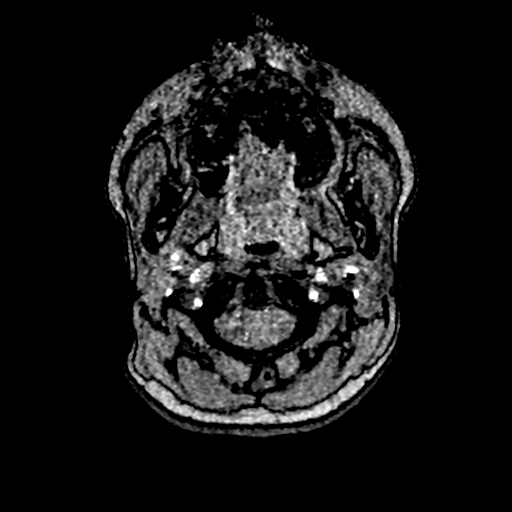
[im 41/205]
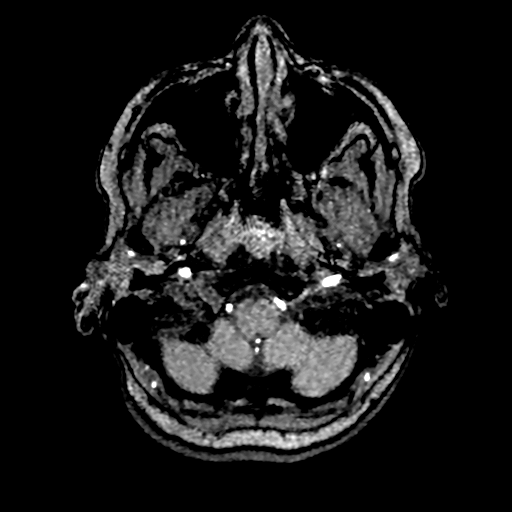
[im 62/205]
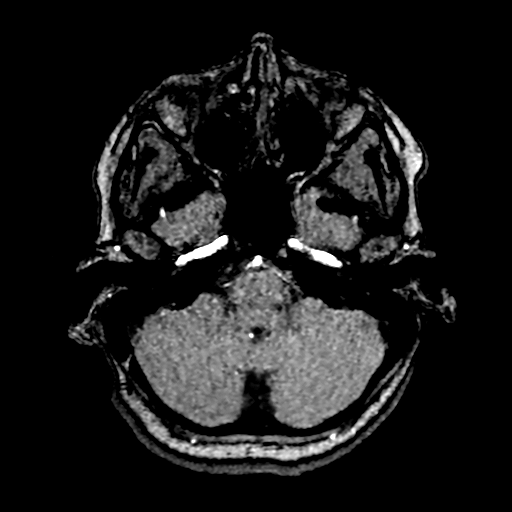

[Series 22: T2 · coronal · 5.0mm · 0.45mm/px · 2 of 30 slices shown (2 of 2)]
[im 1/30]
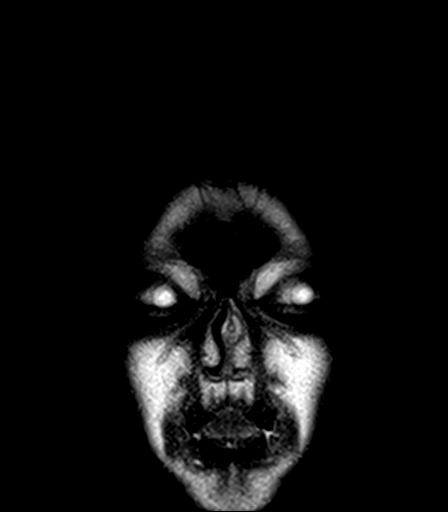
[im 30/30]
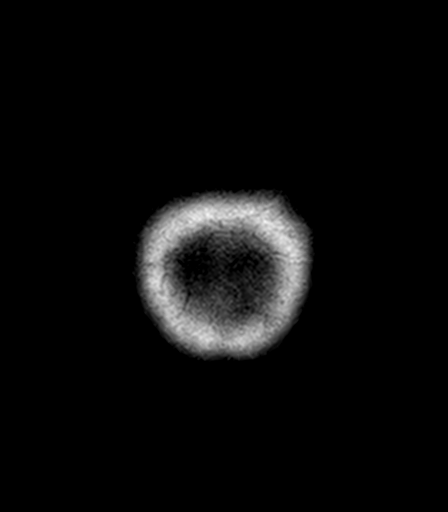

[Series 23: T1 post-contrast · axial · 1.0mm · 0.98mm/px · z∈[-116,+15]mm · 7 of 144 slices shown (1 of 2)]
[im 1/144]
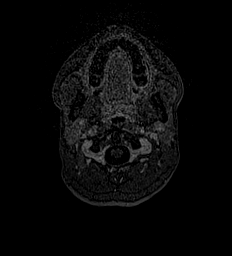
[im 24/144]
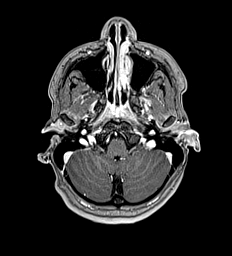
[im 48/144]
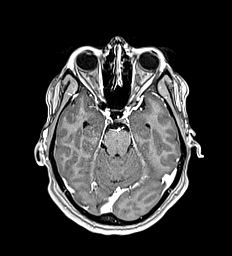
[im 72/144]
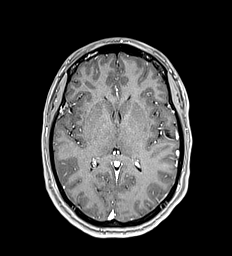
[im 96/144]
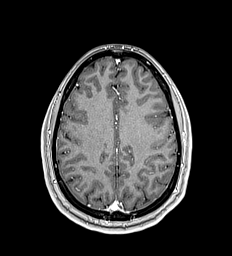
[im 120/144]
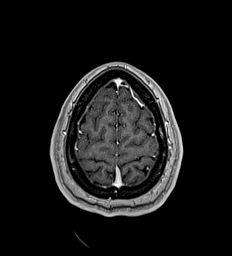
[im 144/144]
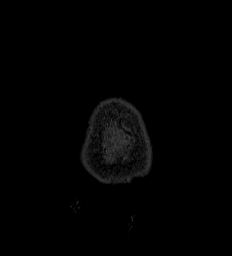

[Series 24: T1 post-contrast · coronal · 5.0mm · 0.57mm/px · 2 of 29 slices shown (2 of 2)]
[im 1/29]
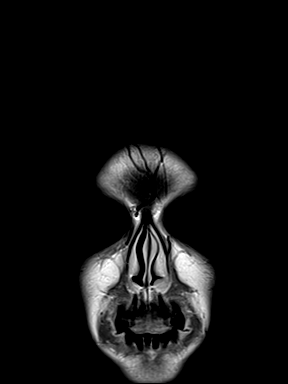
[im 29/29]
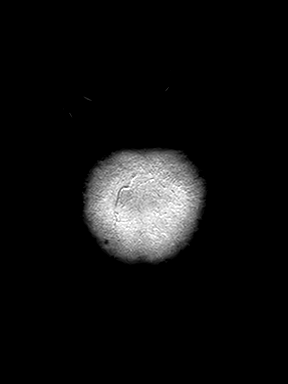

[40 of 48 positions shown; findings below may reference images not displayed]

FINDINGS: Brain: There is a focus of abnormal diffusion restriction in the
left lentiform nucleus adjacent to the posterior limb of the left
internal capsule. No acute or chronic hemorrhage. Mild edema at the
site of diffusion restriction but otherwise normal white matter
signal, parenchymal volume and CSF spaces. The midline structures
are normal. There is no abnormal contrast enhancement.

Vascular: Major flow voids are preserved.

Skull and upper cervical spine: Normal calvarium and skull base.
Visualized upper cervical spine and soft tissues are normal.

Sinuses/Orbits:No paranasal sinus fluid levels or advanced mucosal
thickening. No mastoid or middle ear effusion. Normal orbits.
IMPRESSION: Small acute infarct of the left lentiform nucleus adjacent to the
posterior limb of the left internal capsule. No hemorrhage or mass
effect.

## 2022-08-04 ENCOUNTER — Emergency Department
Admission: EM | Admit: 2022-08-04 | Discharge: 2022-08-04 | Disposition: A | Discharge status: Home / Self Care | Payer: Medicaid Other | Attending: Emergency Medicine | Admitting: Emergency Medicine

## 2022-08-04 ENCOUNTER — Other Ambulatory Visit: Payer: Self-pay

## 2022-08-04 ENCOUNTER — Emergency Department: Payer: Medicaid Other

## 2022-08-04 ENCOUNTER — Encounter: Payer: Self-pay | Admitting: Intensive Care

## 2022-08-04 DIAGNOSIS — R202 Paresthesia of skin: Secondary | ICD-10-CM | POA: Insufficient documentation

## 2022-08-04 DIAGNOSIS — I639 Cerebral infarction, unspecified: Secondary | ICD-10-CM | POA: Insufficient documentation

## 2022-08-04 LAB — COMPREHENSIVE METABOLIC PANEL
ALT: 51 U/L — ABNORMAL HIGH (ref 0–44)
AST: 26 U/L (ref 15–41)
Albumin: 4.3 g/dL (ref 3.5–5.0)
Alkaline Phosphatase: 56 U/L (ref 38–126)
Anion gap: 7 (ref 5–15)
BUN: 14 mg/dL (ref 6–20)
CO2: 27 mmol/L (ref 22–32)
Calcium: 8.9 mg/dL (ref 8.9–10.3)
Chloride: 106 mmol/L (ref 98–111)
Creatinine, Ser: 0.67 mg/dL (ref 0.44–1.00)
GFR, Estimated: >60 mL/min (ref >60)
Glucose, Bld: 107 mg/dL — ABNORMAL HIGH (ref 70–99)
Potassium: 3.8 mmol/L (ref 3.5–5.1)
Sodium: 140 mmol/L (ref 135–145)
Total Bilirubin: 1.6 mg/dL — ABNORMAL HIGH (ref 0.3–1.2)
Total Protein: 7.3 g/dL (ref 6.5–8.1)

## 2022-08-04 LAB — CBC
HCT: 39.7 % (ref 36.0–46.0)
Hemoglobin: 12.4 g/dL (ref 12.0–15.0)
MCH: 26.1 pg (ref 26.0–34.0)
MCHC: 31.2 g/dL (ref 30.0–36.0)
MCV: 83.4 fL (ref 80.0–100.0)
Platelets: 147 10*3/uL — ABNORMAL LOW (ref 150–400)
RBC: 4.76 MIL/uL (ref 3.87–5.11)
RDW: 14.4 % (ref 11.5–15.5)
WBC: 5.1 10*3/uL (ref 4.0–10.5)
nRBC: 0 % (ref 0.0–0.2)

## 2022-08-04 LAB — DIFFERENTIAL
Abs Immature Granulocytes: 0.01 10*3/uL (ref 0.00–0.07)
Basophils Absolute: 0 10*3/uL (ref 0.0–0.1)
Basophils Relative: 0 %
Eosinophils Absolute: 0.2 10*3/uL (ref 0.0–0.5)
Eosinophils Relative: 4 %
Immature Granulocytes: 0 %
Lymphocytes Relative: 45 %
Lymphs Abs: 2.3 10*3/uL (ref 0.7–4.0)
Monocytes Absolute: 0.2 10*3/uL (ref 0.1–1.0)
Monocytes Relative: 4 %
Neutro Abs: 2.4 10*3/uL (ref 1.7–7.7)
Neutrophils Relative %: 47 %

## 2022-08-04 LAB — PROTIME-INR
INR: 1.1 (ref 0.8–1.2)
Prothrombin Time: 13.7 seconds (ref 11.4–15.2)

## 2022-08-04 LAB — ETHANOL: Alcohol, Ethyl (B): <10 mg/dL (ref <10)

## 2022-08-04 LAB — APTT: aPTT: 34 seconds (ref 24–36)

## 2022-08-04 NOTE — ED Notes (Signed)
Pt had a 20g to the right AC. IV removed. Catheter intact, bleeding controlled.

## 2022-08-04 NOTE — ED Provider Notes (Signed)
[facility] Provider Note   Event Date/Time   First MD Initiated Contact with Patient 08/04/22 1055     (approximate) History  Weakness  HPI Andrea Weiss is a 26 y.o. female with a past medical history of CVA and gestational hypertension who presents for left upper and lower extremity paresthesias over the past 2 days.  Patient states that the symptoms are similar to when she had her stroke in the past and she is concerned that she may be having another one now.  Patient denies any other complaints at this time.  Patient denies any other exacerbating or relieving factors ROS: Patient currently denies any vision changes, tinnitus, difficulty speaking, facial droop, sore throat, chest pain, shortness of breath, abdominal pain, nausea/vomiting/diarrhea, dysuria, or weakness/numbness in any extremity   Physical Exam  Triage Vital Signs: ED Triage Vitals [08/04/22 1019]  Enc Vitals Group     BP (!) 119/93     Pulse Rate 75     Resp 16     Temp 98.7 F (37.1 C)     Temp Source Oral     SpO2 100 %     Weight 116 lb 13.5 oz (53 kg)     Height 5\' 6"  (1.676 m)     Head Circumference      Peak Flow      Pain Score 5     Pain Loc      Pain Edu?      Excl. in GC?    Most recent vital signs: Vitals:   08/04/22 1315 08/04/22 1429  BP: 113/77 110/75  Pulse: (!) 52 (!) 52  Resp: (!) 21 18  Temp:  98.2 F (36.8 C)  SpO2: 99% 100%   General: Awake, oriented x4. CV:  Good peripheral perfusion.  Resp:  Normal effort.  Abd:  No distention.  Other:  Young adult African-American female laying in bed in no acute distress.  NIHSS 1 ED Results / Procedures / Treatments  Labs (all labs ordered are listed, but only abnormal results are displayed) Labs Reviewed  CBC - Abnormal; Notable for the following components:      Result Value   Platelets 147 (*)    All other components within normal limits  COMPREHENSIVE METABOLIC PANEL - Abnormal; Notable for the  following components:   Glucose, Bld 107 (*)    ALT 51 (*)    Total Bilirubin 1.6 (*)    All other components within normal limits  PROTIME-INR  APTT  DIFFERENTIAL  ETHANOL  CBG MONITORING, ED  POC URINE PREG, ED   EKG ED ECG REPORT I, 08/06/22, the attending physician, personally viewed and interpreted this ECG. Date: 08/04/2022 EKG Time: 1029 Rate: 73 Rhythm: normal sinus rhythm QRS Axis: normal Intervals: normal ST/T Wave abnormalities: normal Narrative Interpretation: no evidence of acute ischemia RADIOLOGY ED MD interpretation: CT of the head without contrast interpreted by me shows no evidence of acute abnormalities including no intracerebral hemorrhage, obvious masses, or significant edema  MRI of the brain interpreted by me does not show any evidence of acute hemorrhage, obvious masses, or significant edema.  There is evidence of previous stroke -Agree with radiology assessment Official radiology report(s): MR Brain Wo Contrast (neuro protocol)  Result Date: 08/04/2022 CLINICAL DATA:  Left-sided weakness starting Monday. Concern for stroke. EXAM: MRI HEAD WITHOUT CONTRAST TECHNIQUE: Multiplanar, multiecho pulse sequences of the brain and surrounding structures were obtained without intravenous contrast. COMPARISON:  03/05/2021 brain MRI.  FINDINGS: Brain: No acute infarction, hemorrhage, hydrocephalus, extra-axial collection or mass lesion. T2/FLAIR hyperintense signal abnormality in the left internal and external capsule as well as lentiform nucleus correlate with regions of infarction seen on prior brain MRI dated 03/05/2021. Vascular: Normal flow voids. Skull and upper cervical spine: Normal marrow signal. Sinuses/Orbits: Mild left maxillary sinus mucosal thickening. Other: None. IMPRESSION: 1. No acute intracranial abnormality. 2. T2/FLAIR hyperintense signal in the left internal and external capsule, as well as the left lentiform nucleus correlate with areas of  infarction seen on prior brain MRI dated 03/05/21. Electronically Signed   By: Lorenza Cambridge M.D.   On: 08/04/2022 14:19   CT HEAD WO CONTRAST  Result Date: 08/04/2022 CLINICAL DATA:  Left-sided weakness EXAM: CT HEAD WITHOUT CONTRAST TECHNIQUE: Contiguous axial images were obtained from the base of the skull through the vertex without intravenous contrast. RADIATION DOSE REDUCTION: This exam was performed according to the departmental dose-optimization program which includes automated exposure control, adjustment of the mA and/or kV according to patient size and/or use of iterative reconstruction technique. COMPARISON:  03/05/2021 FINDINGS: Brain: No acute intracranial findings are seen. There are no signs of bleeding within the cranium. There is 5 mm low-density in the left basal ganglia suggesting old lacunar infarct. There is no focal edema or mass effect. Vascular: Unremarkable. Skull: Unremarkable. Sinuses/Orbits: There is mild mucosal thickening in the ethmoid sinus. Other: None. IMPRESSION: No acute intracranial findings are seen in noncontrast CT brain. There is 5 mm low-density in the left basal ganglia suggesting old lacunar infarct. Electronically Signed   By: Ernie Avena M.D.   On: 08/04/2022 11:26   PROCEDURES: Critical Care performed: No Procedures MEDICATIONS ORDERED IN ED: Medications - No data to display IMPRESSION / MDM / ASSESSMENT AND PLAN / ED COURSE  I reviewed the triage vital signs and the nursing notes.                             The patient is on the cardiac monitor to evaluate for evidence of arrhythmia and/or significant heart rate changes. Patient's presentation is most consistent with acute presentation with potential threat to life or bodily function. Presents with sensation of tingling to left upper and lower extremity.  Patients symptoms and work-up not consistent with a stroke and therefore they will be discharged from the ED.  Patient has currently been  stabilized in the emergency department.  Patient received an head CT that was negative for stroke.  Patients symptoms not typical for other emergent causes such as dissection, infection, DKA, trauma. Patient will be discharged with strict return precautions and follow up with primary MD within 24 hours for further evaluation.   FINAL CLINICAL IMPRESSION(S) / ED DIAGNOSES   Final diagnoses:  Paresthesia of left upper and lower extremity   Rx / DC Orders   ED Discharge Orders     None      Note:  This document was prepared using Dragon voice recognition software and may include unintentional dictation errors.   Merwyn Katos, MD 08/04/22 [phone]

## 2022-08-04 NOTE — ED Triage Notes (Signed)
Patient presents with left sided weakness that started Monday evening. Significant other reports she fell Monday evening on her buttocks while carrying daughter due to weakness starting and he also reports trouble speaking started last night. Last stroke 2022. LKW unspecific time during day Monday 08/01/2022.

## 2022-08-04 NOTE — ED Notes (Signed)
Pt in room on monitor. Significant other at bedside. Pt states history of a stroke 1 year ago after giving birth to her child. Pt states on this past Monday tingling started in her left fingers and progressed to the hand arm and left leg.  Pt states it feels like the veins are tingling.

## 2023-11-22 NOTE — L&D Delivery Note (Addendum)
 Delivery Note  Andrea Weiss is a [id number] at [redacted]w[redacted]d, Patient's last menstrual period was 01/03/2024 (exact date)., consistent with US  at [redacted]w[redacted]d. Estimated Date of Delivery: 10/09/24   First Stage: Labor onset: 0400 Augmentation: misoprostol  Analgesia Holli intrapartum: None pPROM at 0000 on 09/10/2024 GBS: positive IP Antibiotics: penicillin x 2 doses   Second Stage: Complete dilation at 0710 Onset of pushing at 0710 FHR second stage 145 bpm with moderate variability, intermittent variable decels with pushing   [name] presented to L&D with pPROM. One dose of oral misoprostol  was used for augmentation. She progressed well to C/C/+2 with a strong urge to push.  She pushed quickly over approximately 15 minutes for a spontaneous vaginal birth.  Delivery of a viable baby boy on 09/10/2024 at 0727 by CNM Delivery of fetal head in OA position with restitution to ROT. Loose nuchal cord x 1, easily reduced;  Anterior then posterior shoulders delivered easily with gentle downward traction. Baby placed on mom's chest, and attended to by baby RN Cord double clamped after cessation of pulsation, cut by father of baby  Cord blood sample collection: Yes O POS  Third Stage: Oxytocin  10 units IM was given after delivery of infant for hemorrhage prophylaxis  Placenta delivered via Keren mechanism intact with [facility] @ [phone] Placenta disposition: discarded Uterine tone firm / bleeding moderate  Laceration: none Anesthesia for repair: N/A Suture: N/A Est. Blood Loss (mL): 150 ml   Complications: None  Mom to postpartum.  Baby to Couplet care / Skin to Skin.  Newborn: Information for the patient's newborn:  Ajane, Novella [[id number]]  Live born female  Birth Weight:  5lbs 6oz APGAR: 8, 9   Newborn Delivery   Birth date/time: 09/10/2024 07:27:00 Delivery type: Vaginal, Spontaneous      Feeding planned: breast feeding  ---------- Therisa Pillow, CNM Certified Nurse  Midwife [facility]

## 2024-03-28 ENCOUNTER — Other Ambulatory Visit: Payer: Self-pay

## 2024-03-28 ENCOUNTER — Emergency Department: Payer: Self-pay

## 2024-03-28 ENCOUNTER — Emergency Department
Admission: EM | Admit: 2024-03-28 | Discharge: 2024-03-28 | Disposition: A | Discharge status: Home / Self Care | Payer: Self-pay | Attending: Emergency Medicine | Admitting: Emergency Medicine

## 2024-03-28 DIAGNOSIS — O368910 Maternal care for other specified fetal problems, first trimester, not applicable or unspecified: Secondary | ICD-10-CM | POA: Diagnosis not present

## 2024-03-28 DIAGNOSIS — Z6740 Type O blood, Rh positive: Secondary | ICD-10-CM | POA: Diagnosis not present

## 2024-03-28 DIAGNOSIS — O209 Hemorrhage in early pregnancy, unspecified: Secondary | ICD-10-CM | POA: Diagnosis present

## 2024-03-28 DIAGNOSIS — O4690 Antepartum hemorrhage, unspecified, unspecified trimester: Secondary | ICD-10-CM

## 2024-03-28 DIAGNOSIS — Z3A11 11 weeks gestation of pregnancy: Secondary | ICD-10-CM | POA: Diagnosis not present

## 2024-03-28 DIAGNOSIS — O208 Other hemorrhage in early pregnancy: Secondary | ICD-10-CM

## 2024-03-28 LAB — CBC
HCT: 38.9 % (ref 36.0–46.0)
Hemoglobin: 12.6 g/dL (ref 12.0–15.0)
MCH: 26.6 pg (ref 26.0–34.0)
MCHC: 32.4 g/dL (ref 30.0–36.0)
MCV: 82.2 fL (ref 80.0–100.0)
Platelets: 164 10*3/uL (ref 150–400)
RBC: 4.73 MIL/uL (ref 3.87–5.11)
RDW: 14.1 % (ref 11.5–15.5)
WBC: 6.6 10*3/uL (ref 4.0–10.5)
nRBC: 0 % (ref 0.0–0.2)

## 2024-03-28 LAB — URINALYSIS, ROUTINE W REFLEX MICROSCOPIC
Bilirubin Urine: NEGATIVE
Glucose, UA: NEGATIVE mg/dL
Ketones, ur: NEGATIVE mg/dL
Leukocytes,Ua: NEGATIVE
Nitrite: NEGATIVE
Protein, ur: NEGATIVE mg/dL
Specific Gravity, Urine: 1.02 (ref 1.005–1.030)
pH: 6 (ref 5.0–8.0)

## 2024-03-28 LAB — BASIC METABOLIC PANEL WITH GFR
Anion gap: 11 (ref 5–15)
BUN: 11 mg/dL (ref 6–20)
CO2: 24 mmol/L (ref 22–32)
Calcium: 9.1 mg/dL (ref 8.9–10.3)
Chloride: 100 mmol/L (ref 98–111)
Creatinine, Ser: 0.58 mg/dL (ref 0.44–1.00)
GFR, Estimated: >60 mL/min (ref >60)
Glucose, Bld: 89 mg/dL (ref 70–99)
Potassium: 4.5 mmol/L (ref 3.5–5.1)
Sodium: 135 mmol/L (ref 135–145)

## 2024-03-28 LAB — HCG, QUANTITATIVE, PREGNANCY: hCG, Beta Chain, Quant, S: 47825 m[IU]/mL — ABNORMAL HIGH (ref <5)

## 2024-03-28 LAB — POC URINE PREG, ED: Preg Test, Ur: POSITIVE — AB

## 2024-03-28 LAB — ABO/RH: ABO/RH(D): O POS

## 2024-03-28 NOTE — ED Triage Notes (Signed)
 Patient states vaginal bleeding and thinks she's approximately [redacted] weeks pregnant; has not seen OB.

## 2024-03-28 NOTE — Discharge Instructions (Addendum)
 Your beta-hCG is still pending.  Please follow-up with your OB/GYN for further evaluation.  Otherwise everything including your lab work and ultrasound is reassuring.  There was a finding of a small subchorionic hematoma noted on ultrasound in which we would like for you to closely monitor with your OB/GYN.

## 2024-03-28 NOTE — ED Notes (Signed)
 Pt left before this nurse was able to repeat her vitals. Discharge conducted by provider.

## 2024-03-28 NOTE — ED Notes (Signed)
 See triage note  Presents with some vaginal spotting yesterday  Then states she noticed bleeding today with wiping   Having some back pain also   Pt is [redacted] weeks pregnant

## 2024-03-28 NOTE — ED Provider Notes (Signed)
 [facility] Emergency Department Provider Note     Event Date/Time   First MD Initiated Contact with Patient 03/28/24 1748     (approximate)   History   Vaginal Bleeding   HPI  Andrea Weiss is a 28 y.o. female with a history of anemia presents to the ED for evaluation of vaginal bleeding that started yesterday.  Patient is approximately [redacted] weeks pregnant.  She has not seen OB due to transitioning into insurances.  No other complaints.     Physical Exam   Triage Vital Signs: ED Triage Vitals  Encounter Vitals Group     BP 03/28/24 1631 132/80     Systolic BP Percentile --      Diastolic BP Percentile --      Pulse Rate 03/28/24 1631 99     Resp 03/28/24 1631 18     Temp 03/28/24 1631 98.3 F (36.8 C)     Temp Source 03/28/24 1631 Oral     SpO2 03/28/24 1631 99 %     Weight 03/28/24 1807 116 lb 13.5 oz (53 kg)     Height 03/28/24 1807 5\' 6"  (1.676 m)     Head Circumference --      Peak Flow --      Pain Score 03/28/24 1632 0     Pain Loc --      Pain Education --      Exclude from Growth Chart --     Most recent vital signs: Vitals:   03/28/24 1631  BP: 132/80  Pulse: 99  Resp: 18  Temp: 98.3 F (36.8 C)  SpO2: 99%   General Awake, no distress.  Patient is sitting comfortably in evaluation room. HEENT NCAT. PERRL. EOMI. CV:  Good peripheral perfusion. RRR RESP:  Normal effort. LCTAB ABD:  No distention. Soft  ED Results / Procedures / Treatments   Labs (all labs ordered are listed, but only abnormal results are displayed) Labs Reviewed  URINALYSIS, ROUTINE W REFLEX MICROSCOPIC - Abnormal; Notable for the following components:      Result Value   Color, Urine YELLOW (*)    APPearance CLEAR (*)    Hgb urine dipstick MODERATE (*)    Bacteria, UA MANY (*)    All other components within normal limits  HCG, QUANTITATIVE, PREGNANCY - Abnormal; Notable for the following components:   hCG, Beta Chain, Quant, S 40,981 (*)     All other components within normal limits  POC URINE PREG, ED - Abnormal; Notable for the following components:   Preg Test, Ur Positive (*)    All other components within normal limits  CBC  BASIC METABOLIC PANEL WITH GFR  ABO/RH   RADIOLOGY  I personally viewed and evaluated these images as part of my medical decision making, as well as reviewing the written report by the radiologist.  ED Provider Interpretation: Viable pregnancy  US  OB Comp < 14 Wks Result Date: 03/28/2024 CLINICAL DATA:  Vaginal bleeding EXAM: OBSTETRIC <14 WK ULTRASOUND TECHNIQUE: Transabdominal ultrasound was performed for evaluation of the gestation as well as the maternal uterus and adnexal regions. COMPARISON:  None Available. FINDINGS: Intrauterine gestational sac: Single Yolk sac:  Not Visualized. Embryo:  Visualized. Cardiac Activity: Visualized. Heart Rate: 180 bpm MSD:    mm    w     d CRL:   46.5 mm   11 w 3 d  US  EDC: 10/14/2024 Subchorionic hemorrhage:  Small subchorionic hemorrhage Maternal uterus/adnexae: No adnexal mass or free fluid. IMPRESSION: Eleven week 3 day intrauterine pregnancy. Fetal heart rate 180 beats per minute. Small subchorionic hemorrhage. Electronically Signed   By: Janeece Mechanic M.D.   On: 03/28/2024 20:18    PROCEDURES:  Critical Care performed: No  Procedures   MEDICATIONS ORDERED IN ED: Medications - No data to display   IMPRESSION / MDM / ASSESSMENT AND PLAN / ED COURSE  I reviewed the triage vital signs and the nursing notes.                              Clinical Course as of 03/29/24 0032  Thu Mar 28, 2024  2101 US  OB Comp < 14 Wks Eleven week 3 day intrauterine pregnancy. Fetal heart rate 180 beats per minute. Small subchorionic hemorrhage.   [MH]  Fri Mar 29, 2024  0028 HCG, Toma Founds(!): 82,956 21,308 [MH]    Clinical Course User Index [MH] Alvaro Augusta A, PA-C    28 y.o. female presents to the emergency department for  evaluation and treatment of vaginal bleeding. See HPI for further details.   Differential diagnosis includes, but is not limited to, threatened miscarriage, incomplete miscarriage, normal bleeding from an early trimester pregnancy, ectopic pregnancy, , blighted ovum, vaginal/cervical trauma, subchorionic hemorrhage/hematoma, etc.    Patient's presentation is most consistent with acute complicated illness / injury requiring diagnostic workup.  Patient is alert and oriented.  She is hemodynamic stable.  Physical exam findings are overall reassuring.  Basic labs are reassuring.  No indication for anemia at this time.  Urinalysis revealed moderate Hgb.  hCG 65,784. ABO/Rh O+.  Patient declined pain medication as she states Tylenol  has a adverse effect to her.  Ultrasound is reassuring.  Does reveal small subchorionic hemorrhage which could be contributing to patient's vaginal bleeding patient reports she had this in her last pregnancy.  Encourage close follow-up with OB/GYN for monitoring of pregnancy.  Patient verbalized understanding.  Stable condition for discharge home.  ED return precaution discussed.   FINAL CLINICAL IMPRESSION(S) / ED DIAGNOSES   Final diagnoses:  Vaginal bleeding in pregnancy  Subchorionic hemorrhage of placenta in first trimester     Rx / DC Orders   ED Discharge Orders     None        Note:  This document was prepared using Dragon voice recognition software and may include unintentional dictation errors.    Phyllis Breeze, Altus Zaino A, PA-C 03/29/24 Margueritte Shilling    Marylynn Soho, MD 03/29/24 1515

## 2024-04-04 ENCOUNTER — Telehealth: Payer: Self-pay

## 2024-04-04 ENCOUNTER — Other Ambulatory Visit: Payer: Self-pay | Admitting: Certified Nurse Midwife

## 2024-04-04 DIAGNOSIS — Z8673 Personal history of transient ischemic attack (TIA), and cerebral infarction without residual deficits: Secondary | ICD-10-CM

## 2024-04-04 DIAGNOSIS — O0991 Supervision of high risk pregnancy, unspecified, first trimester: Secondary | ICD-10-CM

## 2024-04-11 DIAGNOSIS — O0991 Supervision of high risk pregnancy, unspecified, first trimester: Secondary | ICD-10-CM | POA: Insufficient documentation

## 2024-04-11 DIAGNOSIS — O0990 Supervision of high risk pregnancy, unspecified, unspecified trimester: Secondary | ICD-10-CM | POA: Insufficient documentation

## 2024-04-17 ENCOUNTER — Encounter: Payer: Self-pay | Admitting: *Deleted

## 2024-04-17 DIAGNOSIS — F1291 Cannabis use, unspecified, in remission: Secondary | ICD-10-CM | POA: Insufficient documentation

## 2024-04-19 ENCOUNTER — Ambulatory Visit: Payer: Self-pay | Attending: Obstetrics and Gynecology

## 2024-04-19 ENCOUNTER — Other Ambulatory Visit: Payer: Self-pay | Admitting: *Deleted

## 2024-04-19 ENCOUNTER — Ambulatory Visit (HOSPITAL_BASED_OUTPATIENT_CLINIC_OR_DEPARTMENT_OTHER): Payer: Self-pay | Admitting: Obstetrics and Gynecology

## 2024-04-19 VITALS — BP 96/58 | HR 100

## 2024-04-19 DIAGNOSIS — Z8673 Personal history of transient ischemic attack (TIA), and cerebral infarction without residual deficits: Secondary | ICD-10-CM | POA: Diagnosis present

## 2024-04-19 DIAGNOSIS — O0992 Supervision of high risk pregnancy, unspecified, second trimester: Secondary | ICD-10-CM

## 2024-04-19 DIAGNOSIS — I639 Cerebral infarction, unspecified: Secondary | ICD-10-CM

## 2024-04-19 DIAGNOSIS — O99332 Smoking (tobacco) complicating pregnancy, second trimester: Secondary | ICD-10-CM | POA: Diagnosis present

## 2024-04-19 DIAGNOSIS — O99320 Drug use complicating pregnancy, unspecified trimester: Secondary | ICD-10-CM | POA: Insufficient documentation

## 2024-04-19 DIAGNOSIS — Z3A14 14 weeks gestation of pregnancy: Secondary | ICD-10-CM | POA: Insufficient documentation

## 2024-04-19 DIAGNOSIS — Z3A15 15 weeks gestation of pregnancy: Secondary | ICD-10-CM | POA: Insufficient documentation

## 2024-04-19 DIAGNOSIS — O0991 Supervision of high risk pregnancy, unspecified, first trimester: Secondary | ICD-10-CM | POA: Diagnosis present

## 2024-04-19 DIAGNOSIS — F1290 Cannabis use, unspecified, uncomplicated: Secondary | ICD-10-CM | POA: Insufficient documentation

## 2024-04-19 NOTE — Progress Notes (Signed)
 Maternal-Fetal Medicine Consultation Name: Andrea Weiss MRN: [id number]  [facility] [id number] at 14w 4d gestation is here for ultrasound evaluation and MFM consultation.  Past medical history: In 2022, 2 weeks after delivery of her daughter, patient presented to the ED with right facial droop and left upper extremity weakness.  Postpartum ischemic stroke was suspected.  MRI reported "There is a focus of abnormal diffusion restriction in the left lentiform nucleus adjacent to the posterior limb of the left internal capsule. No acute or chronic hemorrhage."  Patient is neurological symptoms had resolved and she was discharged after 2 days.  She was not placed on anticoagulants.  TEE showed normal cardiac structure with no thrombus.  LVEF was 60% to 65%.  She does not have any weakness or neurological symptoms now.  He does not have diabetes or hypertension. Past surgical history: Nil of note. Allergies: No known drug allergies. Medications: Prenatal vitamins. Social history: Smokes 1 to 2 cigarettes daily.  Occasionally smokes marijuana.  Does not drink alcohol.  Her partner, who accompanied her, is a father of her previous 2 children and he is in good health. GYN history: History of abnormal Pap smears and cryosurgery.  No history of cone biopsy or LEEP. Obstetric history significant for 2 term vaginal deliveries.  Her first pregnancy was complicated by preterm labor.  She delivered at term.  Her second delivery was complicated by shoulder dystocia with no neurological injuries.  Prenatal course: Patient had 11-week ultrasound and a small subchorionic hemorrhage was noted.  She is unsure of her LMP date.  Ultrasound Fetal biometry is consistent with 14 weeks and 4 days gestation.  Amniotic fluid is normal good fetal activity seen.  Detailed fetal anatomical survey was not performed.  We recommend assigning her EDD at 10/14/2024 based on her 11-week ultrasound measurements.  Our concerns  include: History of postpartum stroke Most likely, she had ischemic stroke.  No clear evidence of thrombosis.  I explained that pregnancy is a procoagulant state.  Since she had stroke in the postpartum period, I recommend anticoagulation with low molecular weight heparin throughout this pregnancy and up to 6 weeks postpartum.  I also recommend that she take low-dose aspirin  prophylaxis.  Patient reports she was advised to aspirin  but has not yet started taking aspirin . I discussed possible side effects of heparin including thrombocytopenia and osteoporosis leading to vertebral fractures (all very rare). Patient will discuss with her partner over the weekend and decide.   Cigarette Smoking increases the risk of maternal complications that include placental abruption and placenta previa. Fetal complications include fetal growth restriction, prematurity and stillbirth. Smoking cessation at any gestational age is beneficial and should routinely be addressed in her prenatal visits. Nicotine-replacement therapy (NRT) can be considered if nonpharmacological approaches have failed. Its chief benefit is that it does not contain other toxic substances present in cigarettes. Cigarettes, in addition to nicotine and Carbon monoxide, contain several other toxic substances. Transdermal patches will be helpful. Cannabis Marijuana is the most-commonly used recreational drug in pregnancy. About 7% to 10% of pregnant women take cannabis. It has been used by some to treat anxiety, depression, nausea and vomiting. A small increase in preterm delivery and neonatal intensive care admissions. No increase in fetal congenital malformations were reported with cannabis. Fetal brain has THC receptor and long-term effects are not known. Concomitant use of tobacco or other drugs increase the risk of pregnancy adverse outcomes. Antiemetics with proven safety are better to take in pregnancy than cannabis. Some studies  reported increase  in stillbirth rates (absolute risk is very small) but that could be because of concomitant use of other recreational drugs or tobacco use. In consistent with ACOG recommendations, cannabis should be avoided in pregnancy. I advised the patient not to use marijuana in pregnancy.  Recommendations - Recommend Lovenox  40 mg subcutaneously daily throughout pregnancy and up to 6 weeks postpartum. - Switch to unfractionated heparin 10,000 units twice daily at [redacted] weeks gestation till delivery. - An appointment was made for her to return in 5 weeks for detailed fetal anatomical survey. - Smoking cessation counseling at her prenatal visit and consider nicotine replacement therapy.  Consultation including face-to-face (more than 50%) counseling 45 minutes.

## 2024-04-24 LAB — OB RESULTS CONSOLE RPR: RPR: NONREACTIVE

## 2024-04-24 LAB — OB RESULTS CONSOLE RUBELLA ANTIBODY, IGM: Rubella: IMMUNE

## 2024-04-24 LAB — OB RESULTS CONSOLE VARICELLA ZOSTER ANTIBODY, IGG: Varicella: IMMUNE

## 2024-04-24 LAB — OB RESULTS CONSOLE HEPATITIS B SURFACE ANTIGEN: Hepatitis B Surface Ag: NEGATIVE

## 2024-05-31 ENCOUNTER — Other Ambulatory Visit: Payer: Self-pay | Admitting: *Deleted

## 2024-05-31 ENCOUNTER — Ambulatory Visit (HOSPITAL_BASED_OUTPATIENT_CLINIC_OR_DEPARTMENT_OTHER)

## 2024-05-31 ENCOUNTER — Ambulatory Visit: Attending: Obstetrics and Gynecology | Admitting: Obstetrics

## 2024-05-31 VITALS — BP 101/64 | HR 96

## 2024-05-31 DIAGNOSIS — O2693 Pregnancy related conditions, unspecified, third trimester: Secondary | ICD-10-CM | POA: Insufficient documentation

## 2024-05-31 DIAGNOSIS — Z363 Encounter for antenatal screening for malformations: Secondary | ICD-10-CM | POA: Insufficient documentation

## 2024-05-31 DIAGNOSIS — R7303 Prediabetes: Secondary | ICD-10-CM | POA: Insufficient documentation

## 2024-05-31 DIAGNOSIS — O99810 Abnormal glucose complicating pregnancy: Secondary | ICD-10-CM

## 2024-05-31 DIAGNOSIS — O99342 Other mental disorders complicating pregnancy, second trimester: Secondary | ICD-10-CM

## 2024-05-31 DIAGNOSIS — Z148 Genetic carrier of other disease: Secondary | ICD-10-CM

## 2024-05-31 DIAGNOSIS — O99413 Diseases of the circulatory system complicating pregnancy, third trimester: Secondary | ICD-10-CM | POA: Diagnosis not present

## 2024-05-31 DIAGNOSIS — O358XX0 Maternal care for other (suspected) fetal abnormality and damage, not applicable or unspecified: Secondary | ICD-10-CM | POA: Diagnosis present

## 2024-05-31 DIAGNOSIS — F1290 Cannabis use, unspecified, uncomplicated: Secondary | ICD-10-CM | POA: Insufficient documentation

## 2024-05-31 DIAGNOSIS — Z8673 Personal history of transient ischemic attack (TIA), and cerebral infarction without residual deficits: Secondary | ICD-10-CM | POA: Insufficient documentation

## 2024-05-31 DIAGNOSIS — O88212 Thromboembolism in pregnancy, second trimester: Secondary | ICD-10-CM | POA: Diagnosis not present

## 2024-05-31 DIAGNOSIS — Z3A20 20 weeks gestation of pregnancy: Secondary | ICD-10-CM | POA: Insufficient documentation

## 2024-05-31 DIAGNOSIS — F1291 Cannabis use, unspecified, in remission: Secondary | ICD-10-CM | POA: Diagnosis not present

## 2024-05-31 DIAGNOSIS — O99412 Diseases of the circulatory system complicating pregnancy, second trimester: Secondary | ICD-10-CM | POA: Diagnosis not present

## 2024-05-31 DIAGNOSIS — O99322 Drug use complicating pregnancy, second trimester: Secondary | ICD-10-CM | POA: Diagnosis not present

## 2024-05-31 DIAGNOSIS — F1210 Cannabis abuse, uncomplicated: Secondary | ICD-10-CM

## 2024-05-31 DIAGNOSIS — O26892 Other specified pregnancy related conditions, second trimester: Secondary | ICD-10-CM | POA: Diagnosis not present

## 2024-05-31 DIAGNOSIS — I639 Cerebral infarction, unspecified: Secondary | ICD-10-CM

## 2024-05-31 DIAGNOSIS — O99323 Drug use complicating pregnancy, third trimester: Secondary | ICD-10-CM

## 2024-05-31 NOTE — Progress Notes (Signed)
 MFM Consult Note  Andrea Weiss is currently at 20 weeks and 4 days.  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    Due to her history of a prior stroke, prophylactic Lovenox  was recommended throughout her current pregnancy.  The patient reports that Andrea Weiss discontinued Lovenox  as Andrea Weiss ran out of the prescription a few days ago.  Andrea Weiss denies any problems since her last exam.  Andrea Weiss had a cell free DNA test earlier in her pregnancy which indicated a low risk for trisomy 53, 26, and 13. A female fetus is predicted.   Andrea Weiss was informed that the fetal growth and amniotic fluid level were appropriate for her gestational age.   There were no obvious fetal anomalies noted on today's ultrasound exam.  The patient was informed that anomalies may be missed due to technical limitations. If the fetus is in a suboptimal position or maternal habitus is increased, visualization of the fetus in the maternal uterus may be impaired.  The patient was advised to call your office to receive a refill for Lovenox .    Andrea Weiss should continue Lovenox  up until 36 weeks.  Andrea Weiss should then be switched to unfractionated heparin 10,000 units twice a day at 36 weeks.    Due to her history of a prior stroke, we will continue to follow her with growth ultrasounds throughout her pregnancy.    A follow-up exam was scheduled in 6 weeks.    The patient stated that all of her questions were answered today.  A total of 30 minutes was spent counseling and coordinating the care for this patient.  Greater than 50% of the time was spent in direct face-to-face contact.

## 2024-07-02 DIAGNOSIS — Z8759 Personal history of other complications of pregnancy, childbirth and the puerperium: Secondary | ICD-10-CM | POA: Insufficient documentation

## 2024-07-02 DIAGNOSIS — O09299 Supervision of pregnancy with other poor reproductive or obstetric history, unspecified trimester: Secondary | ICD-10-CM | POA: Insufficient documentation

## 2024-07-02 NOTE — Progress Notes (Signed)
 Colposcopy Procedure Note Olanna Percifield [id number] 07/02/2024   CC: Abnormal Pap   HPI: Andrea Weiss is a 28 y.o. 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  She is here today for a colposcopy.  She has had other abnormal Pap/s in the past.    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  - Menarche:  - Menses: n/a - Pap hx: +history of abnormals, last 04/24/24 LSIL. Never had any excisional procedures - STI hx: Hx of trich - Sexual preference: Sexually active with female partner - Contraceptive method: n - Fertility desires:  - HPV vaccination: received - Abdominal surgeries: none - GYN procedures: suction D&C   PMHx: Past Medical History:  Diagnosis Date  . Encounter for supervision of other normal pregnancy, third trimester (HHS-HCC) 07/21/2020   28 y.o. G3P1011 at [redacted]w[redacted]d  Patient's last menstrual period was 04/27/2020 (within days). consistent with ultrasound @[redacted]w[redacted]d  .Estimated Date of Delivery: 02/08/2021.  Sex of baby and name:                             FOB:    Eva      Factors complicating this pregnancy   Low platelets at 31wks: rescreen      Subchorionic Hemorrhage                  No further bleeding since 10wks               Pr  . History of abnormal cervical Pap smear   . Hypertension   . Incomplete abortion (HHS-HCC)   . Stroke (cerebrum) (CMS/HHS-HCC)    after giving birth to her daughter in 2022  . Vaginal bleeding in pregnancy, first trimester (HHS-HCC)       PSHx: History reviewed. No pertinent surgical history.   Smoking Status: Social History   Tobacco Use  Smoking Status Former  . Current packs/day: 0.00  . Types: Cigarettes  . Quit date: 05/2024  . Years since quitting: 0.1  Smokeless  Tobacco Never     BP 102/68 (BP Location: Left upper arm, Patient Position: Sitting)   Pulse 92   Ht 162.6 cm (5' 4)   Wt 59.3 kg (130 lb 12.8 oz)   LMP 01/03/2024 (Approximate)   BMI 22.45 kg/m    Procedure Details  The risks and benefits of the procedure were discussed and written informed consent was obtained.   Speculum was placed in vagina and excellent visualization of cervix was achieved.  The entire transformation zone was not seen and the cervix was swabbed x 3 with acetic acid solution and evaluated and then with Lugol's. The Greenfield filter was used.     Findings:   Cervix: Satisfactory? No Dilated multiparous cervix making it difficult to visualize entire transformation zone. Cervix friable. Dense acetowhite around entire transformation zone with overlying punctation. No mosaicism or abnormal vasculature noted.  Due to multiple year history of abnormal pap smears and dense acetowhite change, discussed recommendation for biopsies during pregnancy and patient in agreement. Biopsies taken at 6, 8, and 11 o'clock. Endocervical curettage performed. Hemostasis achieved with Monsels. Specimens labeled and sent to pathology.   Waddell Burkes,  LPN was present as chaperone      Impression: - CIN 2   Specimens: Biopsy specimens sent to pathology.     Complications: none.   Assessment: Taleia Sadowski is a 28 y.o. 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  Colposcopy and biopsies performed today.   Plan: #Pap with LSIL - Colposcopy with biopsies performed today - Specimens labeled and sent to Pathology. Unless pathology shows cervical cancer, plan for repeat colposcopy with biopsies and ECC postpartum - HPV vaccination: received - No HPV result on this pap, but suspect persistent HPV given history. Discussed risk factors for HPV and cervical dysplasia including tobacco use, presence of other sexually transmitted infection, a  weakened immune system, and multiple sexual partners.   #Tobacco use - Reports quitting recently - Congratulated on efforts and encouraged continued cessation through remainder of pregnancy and afterwards - Discussed negative health effects and association with abnormal pap smears and HPV infection  #Marijuana use in pregnancy - Recommended cessation - Counseled patient on potential risks with marijuana use in pregnancy including: miscarriage, stillbirth, preterm delivery, and disruption of normal neurodevelopment (impaired cognition, increased sensitivity to drugs of abuse, decreased attention span, increased behavior problems)  - Total of 4 minutes spend on smoking (tobacco and marijuana) cessation counseling  #OB history - 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 - 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 (left anterior forearm nucleus adjacent to the left internal capsule) No residual deficits Work-up Labs with vit B12 and vit D deficiencies ANA, ANCA negative, TSH wnl, A1c wnl, APLA neg, protein C and S wnl, sickle cell screen negative, Hb electrophoresis wnl TEE negative 30 day holter monitor normal (mostly sinus tachycardia) LDL 76 so not started on statin as breastfeeding Carotid doppler without significant stenosis and doppler negative for DVT Evaluated by neurology and cardiology Recommended continue ASA 81 mg Last seen 2022 Needs neurology follow-up Secondary stroke prevention recommendations Blood pressure target range Systolic <140 (<130 in diabetics) / Diastolic 70-80 Cholesterol range - fasting LDL < 70 mg/dL and checked every 6 months Exercise 30 minutes daily as tolerated. Have aggressive oral care / maintain good teeth cleaning and good gum health. - Scheduled patient to see me at 30 weeks- will discuss further at this visit  RTC in 2 weeks for prenatal visit.    DEMETRA DICIE DINSMORE, MD, 07/02/2024 8:12  PM

## 2024-07-05 ENCOUNTER — Observation Stay
Admission: EM | Admit: 2024-07-05 | Discharge: 2024-07-05 | Disposition: A | Discharge status: Home / Self Care | Attending: Obstetrics and Gynecology | Admitting: Obstetrics and Gynecology

## 2024-07-05 ENCOUNTER — Other Ambulatory Visit: Payer: Self-pay

## 2024-07-05 ENCOUNTER — Observation Stay

## 2024-07-05 DIAGNOSIS — Z8673 Personal history of transient ischemic attack (TIA), and cerebral infarction without residual deficits: Secondary | ICD-10-CM | POA: Insufficient documentation

## 2024-07-05 DIAGNOSIS — N939 Abnormal uterine and vaginal bleeding, unspecified: Secondary | ICD-10-CM | POA: Insufficient documentation

## 2024-07-05 DIAGNOSIS — O23592 Infection of other part of genital tract in pregnancy, second trimester: Secondary | ICD-10-CM | POA: Insufficient documentation

## 2024-07-05 DIAGNOSIS — B9689 Other specified bacterial agents as the cause of diseases classified elsewhere: Secondary | ICD-10-CM | POA: Diagnosis not present

## 2024-07-05 DIAGNOSIS — Z87891 Personal history of nicotine dependence: Secondary | ICD-10-CM | POA: Insufficient documentation

## 2024-07-05 DIAGNOSIS — Z3A25 25 weeks gestation of pregnancy: Secondary | ICD-10-CM | POA: Insufficient documentation

## 2024-07-05 DIAGNOSIS — Z3686 Encounter for antenatal screening for cervical length: Principal | ICD-10-CM

## 2024-07-05 DIAGNOSIS — Z7982 Long term (current) use of aspirin: Secondary | ICD-10-CM | POA: Diagnosis not present

## 2024-07-05 DIAGNOSIS — R109 Unspecified abdominal pain: Secondary | ICD-10-CM | POA: Insufficient documentation

## 2024-07-05 DIAGNOSIS — O26899 Other specified pregnancy related conditions, unspecified trimester: Secondary | ICD-10-CM | POA: Diagnosis present

## 2024-07-05 DIAGNOSIS — O26892 Other specified pregnancy related conditions, second trimester: Principal | ICD-10-CM | POA: Insufficient documentation

## 2024-07-05 LAB — WET PREP, GENITAL
Sperm: NONE SEEN
Trich, Wet Prep: NONE SEEN
WBC, Wet Prep HPF POC: >=10 — AB (ref <10)
Yeast Wet Prep HPF POC: NONE SEEN

## 2024-07-05 LAB — URINALYSIS, COMPLETE (UACMP) WITH MICROSCOPIC
Bilirubin Urine: NEGATIVE
Glucose, UA: NEGATIVE mg/dL
Ketones, ur: NEGATIVE mg/dL
Leukocytes,Ua: NEGATIVE
Nitrite: NEGATIVE
Protein, ur: NEGATIVE mg/dL
Specific Gravity, Urine: 1.017 (ref 1.005–1.030)
pH: 6 (ref 5.0–8.0)

## 2024-07-05 LAB — CHLAMYDIA/NGC RT PCR (ARMC ONLY)
Chlamydia Tr: NOT DETECTED
N gonorrhoeae: NOT DETECTED

## 2024-07-05 LAB — OB RESULTS CONSOLE GC/CHLAMYDIA
Chlamydia: NEGATIVE
Neisseria Gonorrhea: NEGATIVE

## 2024-07-05 LAB — FETAL FIBRONECTIN: Fetal Fibronectin: POSITIVE — AB

## 2024-07-05 MED ORDER — TERBUTALINE SULFATE 1 MG/ML IJ SOLN: 0.2500 mg | Freq: Once | INTRAMUSCULAR | Status: DC | PRN

## 2024-07-05 MED ORDER — METRONIDAZOLE 500 MG PO TABS: 500.0000 mg | ORAL_TABLET | Freq: Two times a day (BID) | ORAL | 0 refills | Status: AC

## 2024-07-05 MED ORDER — LACTATED RINGERS IV BOLUS
1000.0000 mL | Freq: Once | INTRAVENOUS | Status: AC
  Administered 2024-07-05: 1000 mL via INTRAVENOUS

## 2024-07-05 MED ORDER — ACETAMINOPHEN 500 MG PO TABS: 1000.0000 mg | ORAL_TABLET | Freq: Four times a day (QID) | ORAL | Status: DC | PRN

## 2024-07-05 MED ORDER — METRONIDAZOLE 500 MG PO TABS
500.0000 mg | ORAL_TABLET | Freq: Two times a day (BID) | ORAL | Status: DC
  Administered 2024-07-05: 500 mg via ORAL
  Filled 2024-07-05: qty 1

## 2024-07-05 MED ORDER — ACETAMINOPHEN 500 MG PO TABS
1000.0000 mg | ORAL_TABLET | Freq: Four times a day (QID) | ORAL | Status: DC | PRN
  Administered 2024-07-05: 1000 mg via ORAL
  Filled 2024-07-05: qty 2

## 2024-07-05 MED ORDER — CALCIUM CARBONATE ANTACID 500 MG PO CHEW: 2.0000 | CHEWABLE_TABLET | ORAL | Status: DC | PRN

## 2024-07-05 NOTE — OB Triage Note (Signed)
 Pt reports cramping since 0730. Denies vaginal bleeding, LOF. Reports fetal movement. States she lost some vaginal tissue this morning as well. Andrea Weiss

## 2024-07-05 NOTE — Discharge Summary (Signed)
 Andrea Weiss is a 28 y.o. female. She is at [redacted]w[redacted]d gestation. Patient's last menstrual period was 01/03/2024. 10/14/2024, by Ultrasound   Prenatal care site: [facility]  Chief complaint: abdominal pain   Admission Diagnoses:  1) intrauterine pregnancy at [redacted]w[redacted]d  2) Abdominal pain affecting pregnancy [O26.899, R10.9]  Discharge Diagnoses:  Principal Problem:   Abdominal pain affecting pregnancy  Bacterial vaginosis in pregnancy   HPI: Andrea Weiss presents to L&D with complaints of cramping that started this morning. She had a colposcopy done on 07/02/2024 and reported vaginal bleeding and brown discharge following the procedure. She passed a moderate amount of brown tissue this morning and the cramping started afterwards. She reports ongoing brown discharge but denies vaginal bleeding or spotting. Denies recent intercourse.  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  She denies Loss of fluid or Vaginal bleeding. Endorses fetal movement as active.   S: Resting comfortably. no VB.no LOF,  Active fetal movement.   Maternal Medical History:  Past Medical Hx:  has a past medical history of Abdominal hernia, Anemia, Anxiety, Depression, Encounter for elective induction of labor (02/06/2021), Encounter for supervision of other normal pregnancy, third trimester (07/21/2020), History of anemia, History of pica, History of urinary tract infection, Marijuana smoker (02/05/2021), and Medical history non-contributory.    Past Surgical Hx:  has a past surgical history that includes Wisdom tooth extraction (Bilateral); Nexplanon  insertion; and TEE without cardioversion (N/A, 03/26/2021).   Allergies  Allergen Reactions   Mango Flavoring Agent (Non-Screening) Hives and Swelling    Allergy to  mangos     Prior to Admission medications   Medication Sig Start Date End Date Taking? Authorizing Provider  aspirin  EC 81 MG tablet Take 81 mg by mouth daily. Swallow whole.   Yes [provider]  enoxaparin  (LOVENOX ) 40 MG/0.4ML injection Inject 40 mg into the skin daily.   Yes [provider]  Prenatal Vit-Fe Fumarate-FA (MULTIVITAMIN-PRENATAL) 27-0.8 MG TABS tablet Take 1 tablet by mouth daily.   Yes [provider]  acetaminophen  (TYLENOL ) 500 MG tablet Take 2 tablets (1,000 mg total) by mouth every 6 (six) hours as needed for mild pain (pain score 1-3) or moderate pain (pain score 4-6) (for pain scale < 4  OR  temperature  >/=  100.5 F). 07/05/24   Vernel Therisa HERO, CNM  cetirizine  (ZYRTEC ) 10 MG tablet Take 1 tablet (10 mg total) by mouth daily. Patient not taking: Reported on 04/19/2024 12/27/17   Kooistra, Kathryn Lorraine, CNM  metroNIDAZOLE  (FLAGYL ) 500 MG tablet Take 1 tablet (500 mg total) by mouth every 12 (twelve) hours for 7 days. 07/05/24 07/12/24  Vernel Therisa HERO, CNM    Social History: She  reports that she quit smoking about 4 years ago. Her smoking use included cigarettes. She has never used smokeless tobacco. She reports that she does not currently use alcohol. She reports that she does not currently use drugs after having used the following drugs: Marijuana.  Family History: family history is not on file.   Review of Systems: A full review of systems was performed and negative except as noted in the HPI.     Pertinent Results:   O:  BP 113/71 (BP Location: Right Arm)   Pulse 73   Temp 98.2 F (36.8 C) (Oral)   Resp 16  Ht 5' 4 (1.626 m)   Wt 59 kg   LMP 01/03/2024   BMI 22.31 kg/m  Results for orders placed or performed during the hospital encounter of 07/05/24 (from the past 48 hours)  Wet prep, genital   Collection Time: 07/05/24  9:49 AM  Result Value Ref Range   Yeast Wet Prep HPF POC NONE SEEN NONE SEEN   Trich, Wet Prep NONE SEEN  NONE SEEN   Clue Cells Wet Prep HPF POC PRESENT (A) NONE SEEN   WBC, Wet Prep HPF POC >=10 (A) <10   Sperm NONE SEEN   Chlamydia/NGC rt PCR ([facility] only)   Collection Time: 07/05/24  9:49 AM   Specimen: Genital  Result Value Ref Range   Specimen source GC/Chlam ENDOCERVICAL    Chlamydia Tr NOT DETECTED NOT DETECTED   N gonorrhoeae NOT DETECTED NOT DETECTED  Fetal fibronectin   Collection Time: 07/05/24  9:49 AM  Result Value Ref Range   Fetal Fibronectin POSITIVE (A) NEGATIVE  Urinalysis, Complete w Microscopic -Urine, Clean Catch   Collection Time: 07/05/24  9:49 AM  Result Value Ref Range   Color, Urine YELLOW (A) YELLOW   APPearance CLEAR (A) CLEAR   Specific Gravity, Urine 1.017 1.005 - 1.030   pH 6.0 5.0 - 8.0   Glucose, UA NEGATIVE NEGATIVE mg/dL   Hgb urine dipstick SMALL (A) NEGATIVE   Bilirubin Urine NEGATIVE NEGATIVE   Ketones, ur NEGATIVE NEGATIVE mg/dL   Protein, ur NEGATIVE NEGATIVE mg/dL   Nitrite NEGATIVE NEGATIVE   Leukocytes,Ua NEGATIVE NEGATIVE   RBC / HPF 0-5 0 - 5 RBC/hpf   WBC, UA 0-5 0 - 5 WBC/hpf   Bacteria, UA RARE (A) NONE SEEN   Squamous Epithelial / HPF 0-5 0 - 5 /HPF    US  OB Limited Result Date: 07/05/2024 CLINICAL DATA:  Abdominal pain and vaginal bleeding EXAM: LIMITED OBSTETRIC ULTRASOUND AND TRANSVAGINAL OBSTETRIC ULTRASOUND FINDINGS: Number of Fetuses: 1 Heart Rate:  143 bpm Movement: Yes Presentation: Breech Previa: No Placental Location: Posterior Amniotic Fluid (Subjective): Normal Deepest vertical pocket 5.7 cm BPD:  6.24cm 25w 2d Maternal Findings: Cervix:  Closed, 3.3 cm. Uterus/Adnexae: No abnormality visualized.  Ovaries are not seen. IMPRESSION: 1. Single active intrauterine pregnancy in breech presentation measures 25 weeks 2 days' gestation by biparietal diameter. 2. Closed cervix measures 3.3 cm. Electronically Signed   By: Limin  Xu M.D.   On: 07/05/2024 11:43   US  OB Transvaginal Result Date: 07/05/2024 CLINICAL DATA:  Abdominal pain  and vaginal bleeding EXAM: LIMITED OBSTETRIC ULTRASOUND AND TRANSVAGINAL OBSTETRIC ULTRASOUND FINDINGS: Number of Fetuses: 1 Heart Rate:  143 bpm Movement: Yes Presentation: Breech Previa: No Placental Location: Posterior Amniotic Fluid (Subjective): Normal Deepest vertical pocket 5.7 cm BPD:  6.24cm 25w 2d Maternal Findings: Cervix:  Closed, 3.3 cm. Uterus/Adnexae: No abnormality visualized.  Ovaries are not seen. IMPRESSION: 1. Single active intrauterine pregnancy in breech presentation measures 25 weeks 2 days' gestation by biparietal diameter. 2. Closed cervix measures 3.3 cm. Electronically Signed   By: Limin  Xu M.D.   On: 07/05/2024 11:43    Constitutional: NAD, AAOx3  PULM: nl respiratory effort Abd: gravid, non-tender Ext: Non-tender, Nonedmeatous Psych: mood appropriate, speech normal Pelvic : moderate amount of thin white discharge mixed with brown discharge present. Cervix visually closed and long. Friability noted during exam.  NST: Baseline FHR: 145 beats/min Tocometry: Initially regular then absent after IV fluids  Time: > 1 hour   Consults: None  Procedures: Fetal monitoring,  ultrasound, IV fluids   Hospital Course: The patient was admitted to Labor and Delivery Triage for observation. Pelvic exam and wet prep were significant for bacterial vaginosis.  Contractions and pain improved with tylenol  and IV fluids. Reviewed that FFN was positive but likely due to contamination from blood. Cervical length was reassuring at 3.3 cm and no funneling present. Discussed that symptoms were likely due to friable cervix, recent colposcopy, and bacterial vaginosis. Prescription for flagyl  was sent to pharmacy. Reviewed strict PTL precautions. Instructed to make appointment for next week.  She was deemed stable for discharge and further outpatient management.   Discharge Condition: stable  Disposition: Discharge disposition: 01-Home or Self Care       Allergies as of 07/05/2024        Reactions   Mango Flavoring Agent (non-screening) Hives, Swelling   Allergy to mangos        Medication List     TAKE these medications    acetaminophen  500 MG tablet Commonly known as: TYLENOL  Take 2 tablets (1,000 mg total) by mouth every 6 (six) hours as needed for mild pain (pain score 1-3) or moderate pain (pain score 4-6) (for pain scale < 4  OR  temperature  >/=  100.5 F).   aspirin  EC 81 MG tablet Take 81 mg by mouth daily. Swallow whole.   cetirizine  10 MG tablet Commonly known as: ZYRTEC  Take 1 tablet (10 mg total) by mouth daily.   enoxaparin  40 MG/0.4ML injection Commonly known as: LOVENOX  Inject 40 mg into the skin daily.   metroNIDAZOLE  500 MG tablet Commonly known as: FLAGYL  Take 1 tablet (500 mg total) by mouth every 12 (twelve) hours for 7 days.   multivitamin-prenatal 27-0.8 MG Tabs tablet Take 1 tablet by mouth daily.        Follow-up Information     [facility]. Schedule an appointment as soon as possible for a visit in 1 week(s).   Why: routine prenatal appointment as needed if having ongoing symptoms Contact information: [address] [place]  [address] [phone]               ----- Therisa CHRISTELLA Pillow, CNM  Certified Nurse Midwife [facility]

## 2024-07-05 NOTE — Discharge Instructions (Signed)
 PRETERM LABOR: Includes any of the following symptoms that occur between 20-[redacted] weeks gestation. If these symptoms are not stopped, preterm labor can result in preterm delivery, placing your baby at risk.  Notify your doctor if any of the following occur: 1. Menstrual-like cramps   5. Pelvic pressure  2. Uterine contractions. These may be painless and feel like the uterus is tightening or the baby is balling up 6. Increase or change in vaginal discharge  3. Low, dull backache, unrelieved by heat or Tylenol   7. Vaginal bleeding  4. Intestinal cramps, with our without diarrhea, sometimes 8. A general feeling that something is not right   9. Leaking of fluid described as gas pain

## 2024-07-12 ENCOUNTER — Ambulatory Visit: Attending: Obstetrics | Admitting: Obstetrics and Gynecology

## 2024-07-12 ENCOUNTER — Ambulatory Visit

## 2024-07-12 DIAGNOSIS — Z3A26 26 weeks gestation of pregnancy: Secondary | ICD-10-CM | POA: Diagnosis not present

## 2024-07-12 DIAGNOSIS — Z8673 Personal history of transient ischemic attack (TIA), and cerebral infarction without residual deficits: Secondary | ICD-10-CM

## 2024-07-12 DIAGNOSIS — O09292 Supervision of pregnancy with other poor reproductive or obstetric history, second trimester: Secondary | ICD-10-CM

## 2024-07-12 DIAGNOSIS — I639 Cerebral infarction, unspecified: Secondary | ICD-10-CM

## 2024-07-12 DIAGNOSIS — Z148 Genetic carrier of other disease: Secondary | ICD-10-CM | POA: Diagnosis not present

## 2024-07-12 DIAGNOSIS — Z362 Encounter for other antenatal screening follow-up: Secondary | ICD-10-CM | POA: Diagnosis not present

## 2024-07-12 DIAGNOSIS — O99810 Abnormal glucose complicating pregnancy: Secondary | ICD-10-CM | POA: Diagnosis present

## 2024-07-12 DIAGNOSIS — O99322 Drug use complicating pregnancy, second trimester: Secondary | ICD-10-CM | POA: Diagnosis not present

## 2024-07-12 DIAGNOSIS — F1290 Cannabis use, unspecified, uncomplicated: Secondary | ICD-10-CM

## 2024-07-12 DIAGNOSIS — Z681 Body mass index (BMI) 19 or less, adult: Secondary | ICD-10-CM

## 2024-07-12 DIAGNOSIS — O2612 Low weight gain in pregnancy, second trimester: Secondary | ICD-10-CM | POA: Diagnosis not present

## 2024-07-12 NOTE — Progress Notes (Signed)
 Maternal-Fetal Medicine Consultation Name: Andrea Weiss MRN: [id number]  G4 E7987 at 26w 4d gestation.  Patient is here for fetal growth assessment. - Postpartum stroke after delivering the baby's pregnancy.  Patient is taking Lovenox  40 mg daily.  She had consultation with me at [redacted] weeks gestation.  She feels well without any neurological symptoms. - Underweight.  Ultrasound Fetal growth is appropriate for gestational age.  Amniotic fluid is normal good fetal activity seen.  Our concerns include History of postpartum stroke I counseled the patient on continuing Lovenox  to [redacted] weeks gestation and then unfractionated heparin 10,000 units twice daily should be substituted.  After delivery, patient should continue Lovenox  for 6 weeks.  Maternal Underweight It is defined as BMI less than 18.5 Kg/m2.  Studies have shown that low maternal weight can be associated with increased risks of fetal growth restriction, low birthweight, preterm delivery and slight increase in perinatal mortality. Recommendations - Fetal growth assessment at 32 to [redacted] weeks gestation that may be performed at your office. - Unfractionated heparin 10,000 units twice daily from 36-weeks' gestation until delivery. - Lovenox  40 mg daily starting 4 to 6 hours after vaginal delivery and 8 to 12 hours after cesarean delivery.   Consultation including face-to-face (more than 50%) counseling 20 minutes.

## 2024-07-17 LAB — OB RESULTS CONSOLE HIV ANTIBODY (ROUTINE TESTING): HIV: NONREACTIVE

## 2024-08-28 NOTE — Progress Notes (Signed)
 Obstetrics & Gynecology Office Visit  Subjective  Andrea Weiss is a 28 y.o. [phone] at [redacted]w[redacted]d being seen today for ongoing prenatal care.  She is currently monitored for tthis high-risk pregnancy.  Patient's last menstrual period was 01/03/2024 (approximate). Estimated Date of Delivery: 10/09/24  History of Present Illness Andrea Weiss is a 28 year old female who presents for routine prenatal appointment and growth US  with pregnancy-related discomfort at [redacted] weeks gestation.  She is experiencing significant discomfort due to the size and position of the baby. She feels completely tired and out of breath after minimal exertion, such as walking half a mile.  She experiences frequent and intense abdominal tightness, described as extremely tight and discomforting, occurring approximately ten times a day.   Gastrointestinal symptoms include an upset stomach and frequent morning bowel movements, which she describes as cleaning out.  She reports significant fatigue and lightheadedness, and is concerned about possible anemia, as she has been consuming ice frequently.  She is currently taking Lovenox  and has received the RSV vaccine. She is considering a C-section due to previous delivery complications, including the use of a vacuum and shoulder dystocia.  She experiences significant back pain and discomfort due to the weight of the baby, which is exacerbated by standing and walking. She has tried using a support belt without relief.  She feels significant pressure and discomfort, and is worried about the baby's position and her previous delivery experiences.    Pt denies contractions, vaginal bleeding, leaking fluid. Endorses good fetal movement Pt denies HA, VD or RUQ pain.   Objective  BP 116/78   Pulse 92   Ht 162.6 cm (5' 4.02)   Wt 64.9 kg (143 lb)   LMP 01/03/2024 (Approximate)   BMI 24.53 kg/m    Pre-pregnant weight: 49.9 kg (110 lb)  TWG: 15 kg (33 lb)  Gen: NAD   Pulm: No use of accessory muscles, normal respirations Abdomen: Gravid, nontender Ext : No edema, no rashes.   Psych: Mood, insight, judgement intact SVE: deferred  Fundal height: S=D  US : 08/28/2024 EFW: 2137 grams, 4 lbs11 oz, 22% --AC 25 % tile  AFI: 11.3 cm, MVP 3.5 cm FHT: 140 bpm Presentation: cephalic Placenta: fundal, anterior BPP 8/8   Assessment   27 y.o. [id number] at [redacted]w[redacted]d by  10/09/2024, by Last Menstrual Period presenting for routine prenatal visit  The primary encounter diagnosis was Supervision of high risk pregnancy in third trimester (HHS-HCC). Diagnoses of Need for immunization against influenza, Need for RSV immunization, History of stroke, History of shoulder dystocia in prior pregnancy, currently pregnant (HHS-HCC), Marijuana use during pregnancy (HHS-HCC), Maternal tobacco use in third trimester (HHS-HCC), Fatigue during pregnancy in third trimester (HHS-HCC), Vitamin D  deficiency, and Vitamin B12 deficiency were also pertinent to this visit.   Plan   Problem list reviewed and/or updated   Assessment & Plan Discussed RSV vaccine recommended 32-36 weeks.  Respiratory syncytial virus, one of the most common respiratory viruses, usually causes mild cold-like symptoms but can be a serious illness in some people like infants and older adults. In infants, RSV can lead to hospitalization, need for oxygen, intravenous fluids, mechanical ventilation and rarely death. Abrysvo, Pfizer's RSV vaccine, is approved by the FDA for use in pregnant individuals to protect newborns and infants against severe RSV disease in the first 6 months after birth. The vaccine is recommended between 32 and 36 weeks of pregnancy and provides immediate protection for the infant when given at least 14 days before delivery.   -  Handouts provided  -Andrea Weiss desires RSV vaccine today  -RSV vaccine was given during today's visit   Kick counts reviewed with the patient in detail.  Patient instructed to  assess for fetal activity daily at regular intervals.  Counts to be done if decreased activity noted.  Patient instructed to contact the office if counts do not reveal adequate movements. Preterm labor precautions (lower back pain, ctx, ROM, vaginal bleeding) reviewed.  How and when to call reviewed.  Pt verbalized understanding.  Flu vaccine - reviewed risk/benefits of flu vaccine during pregnancy. Andrea Weiss desires vaccine.   -Flu vaccine given today.   -Handouts provided.    High-risk pregnancy, third trimester with suspected anemia, tobacco and drug use, and poor obstetric history In third trimester with high-risk pregnancy. Suspected anemia with symptoms of fatigue and lightheadedness. Fetus in 22nd percentile, smaller size but within normal limits. History of shoulder dystocia and vacuum-assisted delivery. Considering C-section. On Lovenox  and received RSV vaccine. Concern for preterm labor. - Order weekly ultrasounds for biophysical profile. - Check labs for anemia and other issues. - Advise monitoring contractions; if six or more per hour, go to labor and delivery. - Discuss and schedule C-section if desired.    Orders Placed This Encounter  Procedures  . US  FDC BPP without nonstress test (NST) each additional gestation    Standing Status:   Standing    Number of Occurrences:   8    Expiration Date:   02/26/2025    Release to patient:   Immediate  . RSV ADULT VACCINE (>=72YR and Pregnancy 32-36wks ONLY)(No Medicare Patients)(Abrysvo)  . FLU VACCINE MDCK IIV3(EGG FREE), IM PF, (74MO+)(FLUCELVAX)  . CBC w/auto Differential (5 Part)    Release to patient:   Immediate  . Ferritin    Release to patient:   Immediate  . Vitamin D , 25-Hydroxy - [facility]    Release to patient:   Immediate  . Vitamin B12    Release to patient:   Immediate    Return in about 1 week (around 09/04/2024) for routine PNC, BPP.   Attestation Statement:   I personally performed the service, non-incident to.  (WP)   ANNA MICHELLE MACKIE, CNM  This note has been created using automated tools and reviewed for accuracy by [facility].

## 2024-09-10 ENCOUNTER — Inpatient Hospital Stay
Admission: EM | Admit: 2024-09-10 | Discharge: 2024-09-12 | DRG: 806 | Disposition: A | Discharge status: Home / Self Care

## 2024-09-10 ENCOUNTER — Other Ambulatory Visit: Payer: Self-pay

## 2024-09-10 ENCOUNTER — Encounter: Payer: Self-pay | Admitting: Obstetrics and Gynecology

## 2024-09-10 DIAGNOSIS — Z7982 Long term (current) use of aspirin: Secondary | ICD-10-CM

## 2024-09-10 DIAGNOSIS — O99824 Streptococcus B carrier state complicating childbirth: Secondary | ICD-10-CM | POA: Diagnosis present

## 2024-09-10 DIAGNOSIS — R7303 Prediabetes: Secondary | ICD-10-CM | POA: Diagnosis present

## 2024-09-10 DIAGNOSIS — Z8673 Personal history of transient ischemic attack (TIA), and cerebral infarction without residual deficits: Secondary | ICD-10-CM

## 2024-09-10 DIAGNOSIS — O09299 Supervision of pregnancy with other poor reproductive or obstetric history, unspecified trimester: Secondary | ICD-10-CM

## 2024-09-10 DIAGNOSIS — Z3A35 35 weeks gestation of pregnancy: Secondary | ICD-10-CM | POA: Diagnosis not present

## 2024-09-10 DIAGNOSIS — O99320 Drug use complicating pregnancy, unspecified trimester: Secondary | ICD-10-CM | POA: Diagnosis present

## 2024-09-10 DIAGNOSIS — O99810 Abnormal glucose complicating pregnancy: Secondary | ICD-10-CM | POA: Diagnosis present

## 2024-09-10 DIAGNOSIS — O99324 Drug use complicating childbirth: Secondary | ICD-10-CM | POA: Diagnosis present

## 2024-09-10 DIAGNOSIS — Z87891 Personal history of nicotine dependence: Secondary | ICD-10-CM

## 2024-09-10 DIAGNOSIS — R87612 Low grade squamous intraepithelial lesion on cytologic smear of cervix (LGSIL): Secondary | ICD-10-CM | POA: Diagnosis present

## 2024-09-10 DIAGNOSIS — Z8759 Personal history of other complications of pregnancy, childbirth and the puerperium: Secondary | ICD-10-CM

## 2024-09-10 DIAGNOSIS — Z148 Genetic carrier of other disease: Secondary | ICD-10-CM | POA: Diagnosis not present

## 2024-09-10 DIAGNOSIS — O42913 Preterm premature rupture of membranes, unspecified as to length of time between rupture and onset of labor, third trimester: Principal | ICD-10-CM | POA: Diagnosis present

## 2024-09-10 DIAGNOSIS — F1290 Cannabis use, unspecified, uncomplicated: Secondary | ICD-10-CM | POA: Diagnosis present

## 2024-09-10 LAB — CBC
HCT: 39.6 % (ref 36.0–46.0)
Hemoglobin: 12.8 g/dL (ref 12.0–15.0)
MCH: 25.4 pg — ABNORMAL LOW (ref 26.0–34.0)
MCHC: 32.3 g/dL (ref 30.0–36.0)
MCV: 78.7 fL — ABNORMAL LOW (ref 80.0–100.0)
Platelets: 139 K/uL — ABNORMAL LOW (ref 150–400)
RBC: 5.03 MIL/uL (ref 3.87–5.11)
RDW: 15.2 % (ref 11.5–15.5)
WBC: 5.9 K/uL (ref 4.0–10.5)
nRBC: 0 % (ref 0.0–0.2)

## 2024-09-10 LAB — CHLAMYDIA/NGC RT PCR (ARMC ONLY)
Chlamydia Tr: NOT DETECTED
N gonorrhoeae: NOT DETECTED

## 2024-09-10 LAB — URINE DRUG SCREEN, QUALITATIVE (ARMC ONLY)
Amphetamines, Ur Screen: NOT DETECTED
Barbiturates, Ur Screen: NOT DETECTED
Benzodiazepine, Ur Scrn: NOT DETECTED
Cannabinoid 50 Ng, Ur ~~LOC~~: POSITIVE — AB
Cocaine Metabolite,Ur ~~LOC~~: NOT DETECTED
MDMA (Ecstasy)Ur Screen: NOT DETECTED
Methadone Scn, Ur: NOT DETECTED
Opiate, Ur Screen: NOT DETECTED
Phencyclidine (PCP) Ur S: NOT DETECTED
Tricyclic, Ur Screen: NOT DETECTED

## 2024-09-10 LAB — GROUP B STREP BY PCR: Group B strep by PCR: POSITIVE — AB

## 2024-09-10 LAB — TYPE AND SCREEN
ABO/RH(D): O POS
Antibody Screen: NEGATIVE

## 2024-09-10 LAB — RPR: RPR Ser Ql: NONREACTIVE

## 2024-09-10 MED ORDER — OXYTOCIN-SODIUM CHLORIDE 30-0.9 UT/500ML-% IV SOLN: 1.0000 m[IU]/min | INTRAVENOUS | Status: DC

## 2024-09-10 MED ORDER — SODIUM CHLORIDE 0.9 % IV SOLN
5.0000 10*6.[IU] | Freq: Once | INTRAVENOUS | Status: AC
  Administered 2024-09-10: 5 10*6.[IU] via INTRAVENOUS
  Filled 2024-09-10: qty 5

## 2024-09-10 MED ORDER — OXYTOCIN 10 UNIT/ML IJ SOLN: 10.0000 [IU] | Freq: Once | INTRAMUSCULAR | Status: AC

## 2024-09-10 MED ORDER — OXYTOCIN BOLUS FROM INFUSION: 333.0000 mL | Freq: Once | INTRAVENOUS | Status: DC

## 2024-09-10 MED ORDER — SODIUM CHLORIDE 0.9% FLUSH: 3.0000 mL | Freq: Two times a day (BID) | INTRAVENOUS | Status: DC

## 2024-09-10 MED ORDER — LIDOCAINE HCL (PF) 1 % IJ SOLN: 30.0000 mL | INTRAMUSCULAR | Status: DC | PRN

## 2024-09-10 MED ORDER — PRENATAL MULTIVITAMIN CH
1.0000 | ORAL_TABLET | Freq: Every day | ORAL | Status: DC
  Administered 2024-09-10 – 2024-09-12 (×3): 1 via ORAL
  Filled 2024-09-10 (×3): qty 1

## 2024-09-10 MED ORDER — PENICILLIN G POT IN DEXTROSE 60000 UNIT/ML IV SOLN
3.0000 10*6.[IU] | INTRAVENOUS | Status: DC
  Administered 2024-09-10: 3 10*6.[IU] via INTRAVENOUS
  Filled 2024-09-10 (×3): qty 50

## 2024-09-10 MED ORDER — FENTANYL CITRATE (PF) 100 MCG/2ML IJ SOLN
50.0000 ug | INTRAMUSCULAR | Status: DC | PRN
  Administered 2024-09-10: 100 ug via INTRAVENOUS
  Filled 2024-09-10: qty 2

## 2024-09-10 MED ORDER — SIMETHICONE 80 MG PO CHEW
80.0000 mg | CHEWABLE_TABLET | ORAL | Status: DC | PRN
  Filled 2024-09-10: qty 1

## 2024-09-10 MED ORDER — SODIUM CHLORIDE 0.9 % IV SOLN: 250.0000 mL | INTRAVENOUS | Status: DC | PRN

## 2024-09-10 MED ORDER — BENZOCAINE-MENTHOL 20-0.5 % EX AERO
1.0000 | INHALATION_SPRAY | CUTANEOUS | Status: DC | PRN
  Filled 2024-09-10: qty 56

## 2024-09-10 MED ORDER — ONDANSETRON HCL 4 MG PO TABS: 4.0000 mg | ORAL_TABLET | ORAL | Status: DC | PRN

## 2024-09-10 MED ORDER — DIBUCAINE (PERIANAL) 1 % EX OINT: 1.0000 | TOPICAL_OINTMENT | CUTANEOUS | Status: DC | PRN

## 2024-09-10 MED ORDER — OXYTOCIN-SODIUM CHLORIDE 30-0.9 UT/500ML-% IV SOLN
2.5000 [IU]/h | INTRAVENOUS | Status: DC
  Filled 2024-09-10: qty 500

## 2024-09-10 MED ORDER — OXYTOCIN 10 UNIT/ML IJ SOLN
INTRAMUSCULAR | Status: AC
  Administered 2024-09-10: 10 [IU] via INTRAMUSCULAR
  Filled 2024-09-10: qty 1

## 2024-09-10 MED ORDER — SOD CITRATE-CITRIC ACID 500-334 MG/5ML PO SOLN: 30.0000 mL | ORAL | Status: DC | PRN

## 2024-09-10 MED ORDER — ENOXAPARIN SODIUM 40 MG/0.4ML IJ SOSY
40.0000 mg | PREFILLED_SYRINGE | INTRAMUSCULAR | Status: DC
  Administered 2024-09-10 – 2024-09-11 (×2): 40 mg via SUBCUTANEOUS
  Filled 2024-09-10 (×2): qty 0.4

## 2024-09-10 MED ORDER — IBUPROFEN 600 MG PO TABS: 600.0000 mg | ORAL_TABLET | Freq: Four times a day (QID) | ORAL | Status: DC

## 2024-09-10 MED ORDER — ACETAMINOPHEN 500 MG PO TABS
1000.0000 mg | ORAL_TABLET | Freq: Four times a day (QID) | ORAL | Status: DC
  Administered 2024-09-10 – 2024-09-12 (×9): 1000 mg via ORAL
  Filled 2024-09-10 (×10): qty 2

## 2024-09-10 MED ORDER — ONDANSETRON HCL 4 MG/2ML IJ SOLN: 4.0000 mg | Freq: Four times a day (QID) | INTRAMUSCULAR | Status: DC | PRN

## 2024-09-10 MED ORDER — ACETAMINOPHEN 500 MG PO TABS: 1000.0000 mg | ORAL_TABLET | Freq: Four times a day (QID) | ORAL | Status: DC | PRN

## 2024-09-10 MED ORDER — DIPHENHYDRAMINE HCL 25 MG PO CAPS: 25.0000 mg | ORAL_CAPSULE | Freq: Four times a day (QID) | ORAL | Status: DC | PRN

## 2024-09-10 MED ORDER — ZOLPIDEM TARTRATE 5 MG PO TABS: 5.0000 mg | ORAL_TABLET | Freq: Every evening | ORAL | Status: DC | PRN

## 2024-09-10 MED ORDER — SENNOSIDES-DOCUSATE SODIUM 8.6-50 MG PO TABS
2.0000 | ORAL_TABLET | Freq: Every day | ORAL | Status: DC
  Administered 2024-09-11 – 2024-09-12 (×2): 2 via ORAL
  Filled 2024-09-10 (×2): qty 2

## 2024-09-10 MED ORDER — LACTATED RINGERS IV SOLN: 500.0000 mL | INTRAVENOUS | Status: DC | PRN

## 2024-09-10 MED ORDER — FERROUS SULFATE 325 (65 FE) MG PO TABS
325.0000 mg | ORAL_TABLET | Freq: Two times a day (BID) | ORAL | Status: DC
  Administered 2024-09-10 – 2024-09-12 (×4): 325 mg via ORAL
  Filled 2024-09-10 (×4): qty 1

## 2024-09-10 MED ORDER — MISOPROSTOL 50MCG HALF TABLET
50.0000 ug | ORAL_TABLET | Freq: Once | ORAL | Status: AC
  Administered 2024-09-10: 50 ug via ORAL
  Filled 2024-09-10: qty 1

## 2024-09-10 MED ORDER — ONDANSETRON HCL 4 MG/2ML IJ SOLN: 4.0000 mg | INTRAMUSCULAR | Status: DC | PRN

## 2024-09-10 MED ORDER — SODIUM CHLORIDE 0.9% FLUSH: 3.0000 mL | INTRAVENOUS | Status: DC | PRN

## 2024-09-10 MED ORDER — LACTATED RINGERS IV SOLN: INTRAVENOUS | Status: DC

## 2024-09-10 MED ORDER — TERBUTALINE SULFATE 1 MG/ML IJ SOLN: 0.2500 mg | Freq: Once | INTRAMUSCULAR | Status: DC | PRN

## 2024-09-10 MED ORDER — OXYCODONE HCL 5 MG PO TABS
5.0000 mg | ORAL_TABLET | ORAL | Status: DC | PRN
Start: 2024-09-10 — End: 2024-09-13

## 2024-09-10 MED ORDER — COCONUT OIL OIL
1.0000 | TOPICAL_OIL | Status: DC | PRN
  Filled 2024-09-10: qty 7.5

## 2024-09-10 MED ORDER — WITCH HAZEL-GLYCERIN EX PADS: 1.0000 | MEDICATED_PAD | CUTANEOUS | Status: DC | PRN

## 2024-09-10 NOTE — Progress Notes (Signed)
 L&D Note    Subjective:  Contractions are more intense, requiring coaching to help breath through them  Objective:    Current Vital Signs 24h Vital Sign Ranges  T 98.2 F (36.8 C) Temp  Avg: 98.1 F (36.7 C)  Min: 98 F (36.7 C)  Max: 98.2 F (36.8 C)  BP 111/69 BP  Min: 111/69  Max: 111/69  HR 86 Pulse  Avg: 86  Min: 86  Max: 86  RR 18 Resp  Avg: 18  Min: 18  Max: 18  SaO2     No data recorded      Gen: alert, cooperative, no distress FHR: Baseline: 145 bpm, Variability: moderate, Accels: Present, Decels: none Toco: regular, every 2-3 minutes SVE: Dilation: 5 Effacement (%): 80 Cervical Position: Posterior Station: -2 Presentation: Vertex Exam by:: [email or web address]  Medications SCHEDULED MEDICATIONS   oxytocin  40 units in LR 1000 mL  333 mL Intravenous Once   sodium chloride  flush  3 mL Intravenous Q12H    MEDICATION INFUSIONS   sodium chloride      lactated ringers      lactated ringers  125 mL/hr at 09/10/24 0206   oxytocin      oxytocin      pencillin G potassium IV 3 Million Units (09/10/24 9391)    PRN MEDICATIONS  sodium chloride , acetaminophen , fentaNYL  (SUBLIMAZE ) injection, lactated ringers , lidocaine  (PF), ondansetron , sodium chloride  flush, sodium citrate-citric acid , terbutaline    Assessment & Plan:  28 y.o. [id number] at [redacted]w[redacted]d admitted for pPROM -Labor: Active phase labor. S/P 1 dose of oral misoprostol . Now in active labor. -Fetal Well-being: Category I -GBS: positive -  PCN x 2 -Membranes ruptured, clear fluid, spontaneously -Date/time: 09/10/2024 at 0000 -Continue present management. -Analgesia: unmedicated labor support options - received IVPM earlier, declined epidural   Therisa CHRISTELLA Pillow, CNM  09/10/2024 6:51 AM  [facility]

## 2024-09-10 NOTE — H&P (Addendum)
 OB History & Physical   History of Present Illness:   Chief Complaint: water broke   HPI:  Andrea Weiss is a 28 y.o. [id number] female at [redacted]w[redacted]d, Patient's last menstrual period was 01/03/2024 (exact date)., consistent with US  at [redacted]w[redacted]d, with Estimated Date of Delivery: 10/09/24. She has presented to Labor and Delivery due to the leakage of clear amniotic fluid. The patient denies any vaginal bleeding and reports active fetal movement. 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 Andrea Weiss experienced a CVA 2 weeks after the birth of her second child. She is currently prescribed Lovenox  at a dosage of 40 mg daily, with her last dose administered on Sunday at 2000 hours. Her first pregnancy involved a vacuum-assisted vaginal birth of an infant weighing 3350 grams, while her second pregnancy was complicated by shoulder dystocia with an infant weighing 3310 grams. The shoulder dystocia episode lasted less than one minute and was resolved using the McRoberts maneuver and suprapubic pressure. The estimated fetal weight (EFW) was recorded at 2137 grams (22nd percentile) during her most recent growth ultrasound on August 28, 2024. Initially, Andrea Weiss had expressed a preference for a primary cesarean delivery if she reached term; however, she now wishes to pursue a vaginal birth due to her earlier gestational age and the smaller EFW in this pregnancy.  Reports active fetal movement  Contractions: irregular contractions  LOF/SROM: Clear fluid 09/10/2024 at 0000 Vaginal bleeding: denies   Factors complicating pregnancy:  Principal Problem:   Preterm premature rupture of membranes in third trimester, unspecified duration to onset of labor Active  Problems:   History of embolic stroke   History of shoulder dystocia in prior pregnancy, currently pregnant   History of vacuum extraction assisted delivery   Marijuana use during pregnancy   Prediabetes in mother during pregnancy   LGSIL on Pap smear of cervix   Carrier of spinal muscular atrophy    Prenatal care site:  [facility]  Patient Active Problem List   Diagnosis Date Noted   Marijuana use during pregnancy 09/10/2024   Preterm premature rupture of membranes in third trimester, unspecified duration to onset of labor 09/10/2024   Prediabetes in mother during pregnancy 09/10/2024   LGSIL on Pap smear of cervix 09/10/2024   Carrier of spinal muscular atrophy 09/10/2024   History of shoulder dystocia in prior pregnancy, currently pregnant 07/02/2024   History of vacuum extraction assisted delivery 07/02/2024   Supervision of high risk pregnancy, antepartum 04/11/2024   Acute CVA (cerebrovascular accident) (HCC) 03/06/2021   History of embolic stroke 03/06/2021      Maternal Diabetes: No Genetic Screening: Normal fetal results, maternal positive carrier status for SMA Maternal Ultrasounds/Referrals: Normal Fetal Ultrasounds or other Referrals:  Referred to Materal Fetal Medicine  Maternal Substance Abuse:  Yes:  Type: Marijuana Significant Maternal Medications:  Meds include: Other: Lovenox  Significant Maternal Lab Results:  Other: GBS unknown  Number of Prenatal Visits:greater than 3 verified prenatal visits Maternal Vaccinations:RSV: Given during pregnancy <14 days ago, TDap, and Flu Other Comments:  None   Maternal Medical History:   Past Medical History:  Diagnosis Date   Abdominal hernia    Anemia    Anxiety    Depression    Encounter for elective induction of labor 02/06/2021   Encounter for  supervision of other normal pregnancy, third trimester 07/21/2020   Formatting of this note is different from the original.  28 y.o. G3P1011 at [redacted]w[redacted]d   Patient's last menstrual period was 04/27/2020 (within days). consistent with ultrasound @[redacted]w[redacted]d  .Estimated Date of Delivery: 02/08/2021.  Sex of baby and name:      FOB:    Eva   Factors complicating this pregnancy   Low platelets at 31wks: rescreen   Subchorionic Hemorrhage   No further bleeding since 10wks     History of anemia    History of pica    History of urinary tract infection    Marijuana smoker 02/05/2021   UDS negative on 02/06/2021     Medical history non-contributory     Past Surgical History:  Procedure Laterality Date   Nexplanon  insertion     TEE WITHOUT CARDIOVERSION N/A 03/26/2021   Procedure: TRANSESOPHAGEAL ECHOCARDIOGRAM (TEE);  Surgeon: Bosie Vinie LABOR, MD;  Location: [facility] ORS;  Service: Cardiovascular;  Laterality: N/A;   WISDOM TOOTH EXTRACTION Bilateral     Allergies  Allergen Reactions   Mango Flavoring Agent (Non-Screening) Hives and Swelling    Allergy to mangos    Prior to Admission medications   Medication Sig Start Date End Date Taking? Authorizing Provider  aspirin  EC 81 MG tablet Take 81 mg by mouth daily. Swallow whole.    [provider]  cetirizine  (ZYRTEC ) 10 MG tablet Take 1 tablet (10 mg total) by mouth daily. Patient not taking: Reported on 04/19/2024 12/27/17   Kooistra, Kathryn Lorraine, CNM  enoxaparin  (LOVENOX ) 40 MG/0.4ML injection Inject 40 mg into the skin daily.    [provider]  Prenatal Vit-Fe Fumarate-FA (MULTIVITAMIN-PRENATAL) 27-0.8 MG TABS tablet Take 1 tablet by mouth daily.    [provider]    OB History  Gravida Para Term Preterm AB Living  4 2 2  0 1 2  SAB IAB Ectopic Multiple Live Births  1 0 0 0 2    # Outcome Date GA Lbr Len/2nd Weight Sex Type Anes PTL Lv  4 Current           3 Term 02/06/21 [redacted]w[redacted]d 01:36 / 00:10 3310 g F Vag-Spont EPI  LIV     Name: Hancher,GIRL Sharin     Apgar1: 8  Apgar5: 9  2 Term 11/17/17 [redacted]w[redacted]d 16:58 / 00:29 3350 g M Vag-Vacuum EPI  LIV     Name:  Scannell,BOY Daena     Apgar1: 5  Apgar5: 9  1 SAB              Social History: She  reports that she quit smoking about 4 years ago. Her smoking use included cigarettes. She has never used smokeless tobacco. She reports that she does not currently use alcohol. She reports that she does not currently use drugs after having used the following drugs: Marijuana.  Family History: family history is not on file.   Review of Systems: A full review of systems was performed and negative except as noted in the HPI.     Physical Exam:  Vital Signs: BP 111/69   Pulse 86   Temp 98 F (36.7 C) (Oral)   Resp 18   LMP 01/03/2024 (Exact Date)   General: no acute distress.  Lungs: normal respiratory effort Abdomen: soft, gravid Cervix: Dilation: 1 / Effacement (%): 50 / Station: -3  - By FABIENE Brain RN    Extremities: non-tender, symmetric, no edema bilaterally Neurologic: Alert & oriented x 3.  No results found for this or any previous visit (from the past 24 hours).  Pertinent Results:  Prenatal Labs: Blood type/Rh O POS Performed at [facility], [address] [place], [place] [address]    Antibody screen Negative    Rubella Immune    Varicella Immune  RPR NR    HBsAg Neg   Hep C NR   HIV Neg    GC neg  Chlamydia neg  Genetic screening cfDNA negative   1 hour GTT 121  3 hour GTT N/A  GBS Pending      FHT:  FHR: 145 bpm, variability: moderate,  accelerations:  Present,  decelerations:  Absent Category/reactivity:  Category I UC:   regular, every 5-6 minutes   Cephalic by Leop[place] and SVE   No results found.  Assessment:  CATHE BILGER is a 28 y.o. [phone] female at [redacted]w[redacted]d with pPROM.   Plan:  1. Admit to Labor & Delivery - Admission status: Inpatient - Dr. CHARM Dinsmore MD notified of admission and plan of care  - Reason for admission: pPROM - consents reviewed and obtained  2. Fetal Well being  - Fetal Tracing: cat 1 - Group B Streptococcus  ppx indicated: GBS unknown - GBS culture ordered  - Presentation: cephalic confirmed by SVE   - US  done 08/28/2024 confirms cephalic presentation   3. Routine OB: - Prenatal labs reviewed, as above - Rh positive - CBC, T&S, RPR on admit - Clear liquid diet , saline lock  4. Monitoring of labor  - Contractions monitored with external toco - Pelvis proven to 3350 grams adequate for trial of labor  - Plan for expectant management  - Augmentation with oxytocin  as appropriate  - Plan for  continuous fetal monitoring - Maternal pain control as desired - Anticipate vaginal delivery  5. History of CVA - CVA occurred 2 weeks postpartum following her 2nd child  - Workup negative for contributing medical factors  - Takes Lovenox  40 mg daily - last dose was Sunday at 2000  - Plan to restart Lovenox  12 hours postpartum after vaginal birth or 24 hours postpartum after cesarean delivery   6. History of shoulder dystocia  - History of shoulder dystocia with 2nd pregnancy of infant weighing 3310 grams.  - Her first pregnancy was a vacuum assisted vaginal birth without shoulder dystocia of an infant weighing 3350 grams  - Shoulder dystocia was less than 1 minute and resolved with McRoberts maneuver and suprapubic pressure  - She desires to proceed with a vaginal birth since she is preterm and infant's EFW is significantly less than her previous pregnancies   7. History of marijuana use in pregnancy  - Stopped marijuana use in early 2nd trimester  - UDS at done 04/25/2023 was positive for marijuana and morphine  - UDS ordered for admission   5. Post Partum Planning: - Infant feeding: breast feeding - Contraception: IUD - Liletta  - Flu vaccine: Given prenatally - Tdap vaccine: Given prenatally - RSV vaccine: Given during pregnancy <14 days ago on 08/28/2024  Andrea Weiss, Andrea Weiss 09/10/24 2:07 AM  Andrea Weiss, CNM Certified Nurse Midwife [facility]

## 2024-09-10 NOTE — Discharge Instructions (Signed)
 Vaginal Delivery, Care After Refer to this sheet in the next few weeks. These discharge instructions provide you with information on caring for yourself after delivery. Your caregiver may also give you specific instructions. Your treatment has been planned according to the most current medical practices available, but problems sometimes occur. Call your caregiver if you have any problems or questions after you go home. HOME CARE INSTRUCTIONS Take over-the-counter or prescription medicines only as directed by your caregiver or pharmacist. Do not drink alcohol, especially if you are breastfeeding or taking medicine to relieve pain. Do not smoke tobacco. Continue to use good perineal care. Good perineal care includes: Wiping your perineum from front to back Keeping your perineum clean. You can do sitz baths twice a day, to help keep this area clean Do not use tampons, douche or have sex until your caregiver says it is okay. Shower only and avoid sitting in submerged water, aside from sitz baths Wear a well-fitting bra that provides breast support. Eat healthy foods. Drink enough fluids to keep your urine clear or pale yellow. Eat high-fiber foods such as whole grain cereals and breads, brown rice, beans, and fresh fruits and vegetables every day. These foods may help prevent or relieve constipation. Avoid constipation with high fiber foods or medications, such as miralax or metamucil Follow your caregiver's recommendations regarding resumption of activities such as climbing stairs, driving, lifting, exercising, or traveling. Talk to your caregiver about resuming sexual activities. Resumption of sexual activities is dependent upon your risk of infection, your rate of healing, and your comfort and desire to resume sexual activity. Try to have someone help you with your household activities and your newborn for at least a few days after you leave the hospital. Rest as much as possible. Try to rest or  take a nap when your newborn is sleeping. Increase your activities gradually. Keep all of your scheduled postpartum appointments. It is very important to keep your scheduled follow-up appointments. At these appointments, your caregiver will be checking to make sure that you are healing physically and emotionally. SEEK MEDICAL CARE IF:  You are passing large clots from your vagina. Save any clots to show your caregiver. You have a foul smelling discharge from your vagina. You have trouble urinating. You are urinating frequently. You have pain when you urinate. You have a change in your bowel movements. You have increasing redness, pain, or swelling near your vaginal incision (episiotomy) or vaginal tear. You have pus draining from your episiotomy or vaginal tear. Your episiotomy or vaginal tear is separating. You have painful, hard, or reddened breasts. You have a severe headache. You have blurred vision or see spots. You feel sad or depressed. You have thoughts of hurting yourself or your newborn. You have questions about your care, the care of your newborn, or medicines. You are dizzy or light-headed. You have a rash. You have nausea or vomiting. You were breastfeeding and have not had a menstrual period within 12 weeks after you stopped breastfeeding. You are not breastfeeding and have not had a menstrual period by the 12th week after delivery. You have a fever. SEEK IMMEDIATE MEDICAL CARE IF:  You have persistent pain. You have chest pain. You have shortness of breath. You faint. You have leg pain. You have stomach pain. Your vaginal bleeding saturates two or more sanitary pads in 1 hour. MAKE SURE YOU:  Understand these instructions. Will watch your condition. Will get help right away if you are not doing well or  get worse. Document Released: 11/04/2000 Document Revised: 03/24/2014 Document Reviewed: 07/04/2012 [facility] Patient Information 2015 [place], [place]. This  information is not intended to replace advice given to you by your health care provider. Make sure you discuss any questions you have with your health care provider.  Sitz Bath A sitz bath is a warm water bath taken in the sitting position. The water covers only the hips and butt (buttocks). We recommend using one that fits in the toilet, to help with ease of use and cleanliness. It may be used for either healing or cleaning purposes. Sitz baths are also used to relieve pain, itching, or muscle tightening (spasms). The water may contain medicine. Moist heat will help you heal and relax.  HOME CARE  Take 3 to 4 sitz baths a day. Fill the bathtub half-full with warm water. Sit in the water and open the drain a little. Turn on the warm water to keep the tub half-full. Keep the water running constantly. Soak in the water for 15 to 20 minutes. After the sitz bath, pat the affected area dry. GET HELP RIGHT AWAY IF: You get worse instead of better. Stop the sitz baths if you get worse. MAKE SURE YOU: Understand these instructions. Will watch your condition. Will get help right away if you are not doing well or get worse. Document Released: 12/15/2004 Document Revised: 08/01/2012 Document Reviewed: 03/07/2011 [facility] Patient Information 2015 [place], [place]. This information is not intended to replace advice given to you by your health care provider. Make sure you discuss any questions you have with your health care provider.

## 2024-09-10 NOTE — Lactation Note (Signed)
 This note was copied from a baby's chart. Lactation Consultation Note  Patient Name: Boy Davinity Fanara Unijb'd Date: 09/10/2024 Age:28 hours Reason for consult: Follow-up assessment;Late-preterm 34-36.6wks;Breastfeeding assistance   Maternal Data Lactation to room to follow up on a feeding at the breast.    Feeding Mother's Current Feeding Choice: Breast Milk and Formula Nipple Type: Slow - flow  Lactation assisted with a breastfeeding session.  Infant latched with active feeding for 15 minutes at the breast.  Audible swallows heard.  After infant was taken off the breast, mom fed infant 5ml of EBM.    LATCH Score Latch: Grasps breast easily, tongue down, lips flanged, rhythmical sucking.  Audible Swallowing: A few with stimulation  Type of Nipple: Everted at rest and after stimulation  Comfort (Breast/Nipple): Soft / non-tender  Hold (Positioning): No assistance needed to correctly position infant at breast.  LATCH Score: 9   Lactation Tools Discussed/Used    Interventions Interventions: Education;Adjust position;Support pillows  Lactation reviewed the importance of feeding infant at least every 3hrs but to continue to watch infant feeding cues. Reviewed late preterm feeding behavior as well.  Mom verbalized understanding.  Consult Status Consult Status: Follow-up Follow-up type: In-patient    Xochilth Standish S Tnia Anglada 09/10/2024, 4:26 PM

## 2024-09-10 NOTE — Lactation Note (Signed)
 This note was copied from a baby's chart. Lactation Consultation Note  Patient Name: Boy Kaavya Puskarich Unijb'd Date: 09/10/2024 Age:28 hours Reason for consult: Initial assessment;Late-preterm 34-36.6wks;Infant < 6lbs;Breastfeeding assistance;RN request   Maternal Data Has patient been taught Hand Expression?: Yes Does the patient have breastfeeding experience prior to this delivery?: Yes How long did the patient breastfeed?: 1- 6 months , 2 - 6 months  Initial assessment w/ a 4hr old baby boy and mom.  This is mom's 3rd baby.  This was a SVD.  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  Mom informed lactation that she breastfed her other children for 6 months w/ no challenges but sore nipples in the beginning.  Mom does not have a breastpump at home.  Feeding Mother's Current Feeding Choice: Breast Milk and Formula Nipple Type: Slow - flow  Lactation walked into room as mom was attempting to feed infant at the breast.  Lactation provided tips/tricks to assist w/ a feeding at the breast.  Infant latch for about 15 minutes nursing on/off w/ some audible swallows.  LC had mom follow up nursing session w/ supplementation as stated in LPI protocol.  Infant was pace fed 5ml of formula that spit up shortly after the feeding.   LATCH Score Latch: Repeated attempts needed to sustain latch, nipple held in mouth throughout feeding, stimulation needed to elicit sucking reflex.  Audible Swallowing: A few with stimulation  Type of Nipple: Everted at rest and after stimulation  Comfort (Breast/Nipple): Soft / non-tender  Hold (Positioning): Assistance needed to correctly position infant at breast and maintain latch.  LATCH Score: 7   Lactation Tools Discussed/Used A DEBP was set up in room, mom pumped and was able to extract out 5ml of colostrum from the left  breast.  Flanges used were 18mm on rt breast and 21mm on left.  Education provided on how to use and when to pump.  Mom verbalized understanding.   Interventions Interventions: Breast feeding basics reviewed;Assisted with latch;Hand express;Breast compression;Adjust position;Support pillows;Position options;Education;DEBP  LC provided education on the following;  milk production expectations, hunger cues, day 1/2 wet/dirty diapers, hand expression, cluster feeding, benefits of STS and arousing infant for a feeding.  Lactation informed patient of feeding infant at least 8 or more times w/in a 24hr period but not exceeding 3hrs. Patient verbalized understanding.   LC provided education on LPT infant behavior and what to expect while she is in the hospital.   Discharge Pump: Advised to call insurance company [facility]: No  Consult Status Consult Status: Follow-up Follow-up type: In-patient    Elyse Prevo S Khadejah Son 09/10/2024, 2:10 PM

## 2024-09-10 NOTE — Discharge Summary (Signed)
 Postpartum Discharge Summary  Patient Name: Andrea Weiss DOB: 1996-01-30 MRN: [id number]  Date of admission: 09/10/2024 Delivery date:09/10/2024 Delivering provider: Hammond Obeirne M Date of discharge: 09/12/2024  Primary OB: [facility] OFE:Ejupzwu'd last menstrual period was 01/03/2024 (exact date). EDC Estimated Date of Delivery: 10/09/24 Gestational Age at Delivery: [redacted]w[redacted]d   Admitting diagnosis: Preterm premature rupture of membranes in third trimester, unspecified duration to onset of labor [O42.913] Intrauterine pregnancy: [redacted]w[redacted]d     Secondary diagnosis:   Principal Problem:   Preterm premature rupture of membranes in third trimester, unspecified duration to onset of labor Active Problems:   History of embolic stroke   History of shoulder dystocia in prior pregnancy, currently pregnant   History of vacuum extraction assisted delivery   Marijuana use during pregnancy   Prediabetes in mother during pregnancy   LGSIL on Pap smear of cervix   Carrier of spinal muscular atrophy   NSVD (normal spontaneous vaginal delivery)   Preterm delivery   Discharge Diagnosis: Preterm Pregnancy Delivered      Hospital course: Onset of Labor With Vaginal Delivery      28 y.o. yo [id number] at [redacted]w[redacted]d was admitted for pPROM on 09/10/2024. One dose of oral misoprostol  was used for augmentation. She progressed well to C/C/+2 with a strong urge to push.  She pushed quickly over approximately 15 minutes for a spontaneous vaginal birth.  Membrane Rupture Time/Date: 12:00 AM,09/10/2024  Delivery Method:Vaginal, Spontaneous Operative Delivery:N/A Episiotomy: None Lacerations:  1st degree Patient had an uncomplicated postpartum course.  Lovenox  was started at 12 hours postpartum for VTE prophylaxis d/t history of stroke 2 weeks postpartum in her previous pregnancy. Referral placed to [facility] Neurology for follow up. She is ambulating, tolerating a regular diet, passing flatus, and urinating  well. Patient is discharged home in stable condition on 09/12/24.  Newborn Data: Birth date:09/10/2024 Birth time:7:27 AM Gender:Female Living status:Living Apgars:9 ,9  Weight:2450 g                                            Post partum procedures:none Augmentation:: Cytotec  Complications: None Delivery Type: spontaneous vaginal delivery Anesthesia: non-pharmacological methods Placenta: spontaneous To Pathology: No   Prenatal Labs:  Blood type/Rh O POS Performed at [facility], [address] [place], [place] [address]    Antibody screen Negative    Rubella Immune    Varicella Immune  RPR NR    HBsAg Neg   Hep C NR   HIV Neg    GC neg  Chlamydia neg  Genetic screening cfDNA negative   1 hour GTT 121  3 hour GTT N/A  GBS Pos      Magnesium  Sulfate received: No BMZ received: No Rhophylac:was not indicated MMR: was not indicated Varivax vaccine given: was not indicated Tdap vaccine: Given prenatally Flu vaccine: Given prenatally RSV vaccine:RSV Given: Given during pregnancy <14 days ago on 08/28/2024  Transfusion:No  Physical exam  Vitals:   09/11/24 0807 09/11/24 1526 09/11/24 2343 09/12/24 0826  BP: 102/76 99/67 (!) 99/55 112/87  Pulse: 81 82 60 97  Resp: 16 18 17 18   Temp: 98.4 F (36.9 C) 98.4 F (36.9 C) 98.4 F (36.9 C) 98.2 F (36.8 C)  TempSrc: Oral Oral Oral Oral  SpO2: 100%  99% 98%  Weight:      Height:       General: alert,  cooperative, and no distress Lochia: appropriate Uterine Fundus: firm Perineum:minimal edema/intact DVT Evaluation: No evidence of DVT seen on physical exam.  Labs: Lab Results  Component Value Date   WBC 7.6 09/11/2024   HGB 10.4 (L) 09/11/2024   HCT 32.5 (L) 09/11/2024   MCV 79.9 (L) 09/11/2024   PLT 164 09/11/2024      Latest Ref Rng & Units 03/28/2024    4:32 PM  CMP  Glucose 70 - 99 mg/dL 89   BUN 6 - 20 mg/dL 11   Creatinine 9.55 - 1.00 mg/dL 9.41   Sodium 864 - 854 mmol/L 135   Potassium  3.5 - 5.1 mmol/L 4.5   Chloride 98 - 111 mmol/L 100   CO2 22 - 32 mmol/L 24   Calcium  8.9 - 10.3 mg/dL 9.1    Edinburgh Score:    09/11/2024    4:15 PM  Edinburgh Postnatal Depression Scale Screening Tool  I have been able to laugh and see the funny side of things. 0  I have looked forward with enjoyment to things. 0  I have blamed myself unnecessarily when things went wrong. 0  I have been anxious or worried for no good reason. 0  I have felt scared or panicky for no good reason. 0  Things have been getting on top of me. 0  I have been so unhappy that I have had difficulty sleeping. 0  I have felt sad or miserable. 0  I have been so unhappy that I have been crying. 0  The thought of harming myself has occurred to me. 0  Edinburgh Postnatal Depression Scale Total 0     Postpartum VTE Prophylaxis  Recommend 6 weeks of prophylactic anticoagulation with LMWH or subcutaneous unfractionated heparin if 1 or more high risk factor is present.  Recommend 14 days of prophylactic anticoagulation with LMWH or subcutaneous unfractionated heparin if 3 or more moderate risk factors are present.   Risk assessment for postpartum VTE and prophylactic treatment: High risk factors: On anticoagulation treatment or prophylaxis antepartum  and history of CVA 2 weeks postpartum in previous pregnancy  Moderate risk factors: None  Postpartum VTE prophylaxis with LMWH Continued for 6 weeks postpartum     After visit meds:  Allergies as of 09/12/2024       Reactions   Mango Flavoring Agent (non-screening) Hives, Swelling   Allergy to mangos        Medication List     STOP taking these medications    aspirin  EC 81 MG tablet   cetirizine  10 MG tablet Commonly known as: ZYRTEC        TAKE these medications    acetaminophen  500 MG tablet Commonly known as: TYLENOL  Take 2 tablets (1,000 mg total) by mouth every 6 (six) hours.   enoxaparin  40 MG/0.4ML injection Commonly known as:  LOVENOX  Inject 40 mg into the skin daily.   multivitamin-prenatal 27-0.8 MG Tabs tablet Take 1 tablet by mouth daily.       Discharge home in stable condition Infant Feeding: Breast Infant Disposition:home with mother Discharge instruction: per After Visit Summary and Postpartum booklet. Activity: Advance as tolerated. Pelvic rest for 6 weeks.  Diet: routine diet Anticipated Birth Control:  Contraceptives: IUD Liletta  Postpartum Appointment:6 weeks Additional Postpartum F/U: None Future Appointments:No future appointments. Follow up Visit:  Follow-up Information     Vernel Therisa HERO, CNM. Schedule an appointment as soon as possible for a visit in 6 week(s).   Specialty: Certified Nurse Midwife  Why: postpartum visit, iud inseriton Contact information: [address] [place] [address] [phone]         [facility] Schedule an appointment as soon as possible for a visit.   Why: for neurology consult postpartum. We've placed a referral for you and they will call to set up an appointment. Contact information: [address] [place] [address] [phone]                 Plan:  Andrea Weiss was discharged to home in good condition. Follow-up appointment as directed.    Signed:  Therisa Pillow, CNM Certified Nurse Midwife [facility] OB[facility]N [facility]

## 2024-09-11 LAB — CBC
HCT: 32.5 % — ABNORMAL LOW (ref 36.0–46.0)
Hemoglobin: 10.4 g/dL — ABNORMAL LOW (ref 12.0–15.0)
MCH: 25.6 pg — ABNORMAL LOW (ref 26.0–34.0)
MCHC: 32 g/dL (ref 30.0–36.0)
MCV: 79.9 fL — ABNORMAL LOW (ref 80.0–100.0)
Platelets: 164 K/uL (ref 150–400)
RBC: 4.07 MIL/uL (ref 3.87–5.11)
RDW: 15.1 % (ref 11.5–15.5)
WBC: 7.6 K/uL (ref 4.0–10.5)
nRBC: 0 % (ref 0.0–0.2)

## 2024-09-11 MED ORDER — MAGNESIUM OXIDE -MG SUPPLEMENT 400 (240 MG) MG PO TABS
400.0000 mg | ORAL_TABLET | Freq: Two times a day (BID) | ORAL | Status: DC
  Administered 2024-09-11 – 2024-09-12 (×3): 400 mg via ORAL
  Filled 2024-09-11 (×3): qty 1

## 2024-09-11 NOTE — Lactation Note (Signed)
 This note was copied from a baby's chart. Lactation Consultation Note  Patient Name: Boy Randal Yepiz Unijb'd Date: 09/11/2024 Age:28 hours  Reason for consult:Follow-up assessment;Late-preterm 34-36.6wks;Infant < 6lbs;Other (Comment); LPT feeding plan   Maternal Data  This is mom's 4th baby, SVD. Mom with previous baby has history of stroke 2 weeks post partum(on lovenox ).  Mom with history of MJ and tobacco use. Mom and baby with positive UDS for cannaboid on admission.  On follow-up mom reports she has been following the feeding plan for her LPT baby. Per mom baby is consistently latching and breastfeeding and also bottle feeds well after each feeding. Mom reports she is happy baby takes both the breast and the bottle without any preference for one verses the other. Care nurse reports she has observed a feeding this am and baby is latching and feeding well at the breast. Mom reports baby has recently completed a feeding. Mom with no additional questions at this time. LC provided discharge teaching. LC number on board in room if mom has any additional questions or would like LC assistance.  Has patient been taught hand expression? Yes Mom is an experienced breastfeeding mother. Breastfed each of her 2 previous children for 6 months. Feeding Mom's feeding choice on admission: Breastmilk and Formula  Interventions  Education  Discharge  Per mom she will use medela manual pump until she receives DEBP from [employer].  Reviewed management of breast engorgement and nipple care, when to call the Pediatrician, and outpatient resources/recommendation.  Update provided to care nurse.  Consult Status  Follow-up Inpatient 09/12/24   Avelina DELENA Gaskins 09/11/2024, 6:39 PM

## 2024-09-11 NOTE — Progress Notes (Signed)
 I concur with student charting.

## 2024-09-11 NOTE — Progress Notes (Signed)
 Post Partum Day 1 Subjective: Doing well, no complaints.  Tolerating regular diet, pain with PO meds, voiding and ambulating without difficulty. She reports intermittent calf cramps.   No CP SOB Fever,Chills, N/V or leg pain; denies nipple or breast pain, no HA change of vision, RUQ/epigastric pain  Objective: BP 102/76 (BP Location: Left Arm)   Pulse 81   Temp 98.4 F (36.9 C) (Oral)   Resp 16   Ht 5' 4 (1.626 m)   Wt 67.1 kg   LMP 01/03/2024 (Exact Date)   SpO2 100%   Breastfeeding Unknown   BMI 25.40 kg/m    Physical Exam:  General: NAD Breasts: soft/nontender CV: RRR Pulm: nl effort, CTABL Abdomen: soft, NT, BS x 4 Perineum: minimal edema, intact  Lochia: moderate Uterine Fundus: fundus firm and 1 fb below umbilicus DVT Evaluation: no cords, ttp Les, negative Homans sign, no heat or edema noted   Recent Labs    09/10/24 0145 09/11/24 0437  HGB 12.8 10.4*  HCT 39.6 32.5*  WBC 5.9 7.6  PLT 139* 164    Assessment/Plan: 28 y.o. [id number] postpartum day # 1  - Continue routine PP care - Lactation consult PRN - Discussed contraceptive options including implant, IUDs hormonal and non-hormonal, injection, pills/ring/patch, condoms, and NFP.  - Expected acute blood loss anemia, clinically insignificant - hemoglobin changed from 12.8 to 10.4, patient is asymptomatic, hemodynamically stable; start po ferrous sulfate  BID with stool softeners - Hx of CVA: no evidence of DVT on exam, continue Lovenox  40mg  q24hrs, educated pt about signs of blood clots - leg cramps: prescribed Magnesium  400mg  BID PO, encouraged to stretch legs and hydrate.  - Immunization status: all Imms up to date  Disposition: Does not desire Dc home today.  Edsel Charlies Blush, CNM 09/11/2024 11:19 AM

## 2024-09-11 NOTE — Clinical Social Work Maternal (Signed)
 CLINICAL SOCIAL WORK MATERNAL/CHILD NOTE  Patient Details  Name: Andrea Weiss MRN: [id number] Date of Birth: 12/14/1995  Date:  November 27, 2023  Clinical Social Worker Initiating Note:  Corrie Ruts Date/Time: Initiated:  09/11/24/1115     Child's Name:  Andrea Weiss   Biological Parents:  Mother   Need for Interpreter:  None   Reason for Referral:  Current Substance Use/Substance Use During Pregnancy     Address:  [address] [place] [address]    Phone number:  [phone] (home)     Additional phone number:   Household Members/Support Persons (HM/SP):       HM/SP Name Relationship DOB or Age  HM/SP -1  Andrea Weiss  Son of Patient   6  HM/SP -2  Andrea Weiss   Daughter of patient   3  HM/SP -3  Andrea Weiss  FOB  33  HM/SP -4        HM/SP -5        HM/SP -6        HM/SP -7        HM/SP -8          Natural Supports (not living in the home):  Immediate Family, Extended Family   Professional Supports:     Employment: Unemployed   Type of Work:     Education:  Some Materials engineer arranged:    Surveyor, quantity Resources:  [employer]   Other Resources:  Archivist Considerations Which May Impact Care:    Strengths:  Ability to meet basic needs  , Compliance with medical plan  , Home prepared for child  , Pediatrician chosen   Psychotropic Medications:         Pediatrician:    [employer]  Pediatrician List:   [employer]    [place]    [facility]  [facility]      Pediatrician Fax Number:    Risk Factors/Current Problems:  Substance Use     Cognitive State:  Alert     Mood/Affect:  Calm  , Happy     CSW Assessment:  Chart reviewed. I received a consult that the patient and the baby tested positive for THC. I spoke with the patient at bedside today. I introduced myself, my role, and reason for consult. The patient  said she was feeling surprised after birth. The patient confirmed that her address is [address], [place]. And her telephone number is [phone] or [phone]. The patient reports that the father of the baby is Development worker, community. The patient reports that she lives in the home with the FOB, her daughter, and son. The patient reports that she has a support system outside of the home. The patient reports that FOB family lives on the same street as her.   The patient reports that her highest level of education is some college and she is currently unemployed but when she goes back to work she will work for [employer] supervised living and [employer]. The patient reports that she receives Sales executive. The patient reports that she has a mental health history of depression and anxiety. The patient reports that she does not take any mental health medications and is not active in therapy. The patient reports that she has no past or current SI/HI/DV during the time of the assessment.   The patient reports that  she has a PCP and the baby will go to [facility] for all medical appointments.   I reviewed information on Post partum depression, Sudden Death syndrome, car seat safety, safe sleep environment, post partum community resources, mental health resources. THC use and breast feeding. The patient verbalized understanding.   I informed the patient about the law and hospital policy on drug exposed children. I informed the patient that CPS has to bed notified. The patient verbalized understanding. The patient was accepting.   The patient reports that she has a crib, bassinet, diapers, clothes, and car seat. The patient reports that she will be breast feeding and she will get a beast pump through her insurance. The patient reports that she FOB will help during D/C. The patient reports that she does not remember her last date of use of THC. The patient reports that she has had CPS  involvement this past year but it was for her older child and nothing related to [facility] use.   I have made the CPS report with [place] DSS, Ellouise Christmas. There are no other TOC needs at this time. We are waiting for the baby cord blood to come back. The mother would like updates on If CPS takes the case or not.   CSW Plan/Description:  Sudden Infant Death Syndrome (SIDS) Education, Perinatal Mood and Anxiety Disorder (PMADs) Education, Hospital Drug Screen Policy Information, Child Protective Service Report  , CSW Awaiting CPS Disposition Plan    Corrie JINNY Ruts, LCSW 10/11/2024, 2:34 PM

## 2024-09-12 MED ORDER — ACETAMINOPHEN 500 MG PO TABS: 1000.0000 mg | ORAL_TABLET | Freq: Four times a day (QID) | ORAL | Status: AC

## 2024-09-12 NOTE — Progress Notes (Signed)
 Pt discharged with infant.  Discharge instructions, prescriptions and follow up appointment given to and reviewed with pt. Pt verbalized understanding. Escorted out by auxillary.

## 2024-09-12 NOTE — TOC Progression Note (Signed)
 Transition of Care [facility]) - Progression Note    Patient Details  Name: Andrea Weiss MRN: [id number] Date of Birth: 08/20/1996  Transition of Care [facility]) CM/SW Contact  K'La JINNY Ruts, LCSW Phone Number: 09/12/2024, 2:26 PM  Clinical Narrative:    Chart reviewed. I spoke with Ellouise Christmas at [facility] DSS and she reported that the CPS report was screened out.                      Expected Discharge Plan and Services         Expected Discharge Date: 09/12/24                                     Social Drivers of Health (SDOH) Interventions SDOH Screenings   Food Insecurity: No Food Insecurity (09/10/2024)  Housing: Low Risk  (09/10/2024)  Transportation Needs: No Transportation Needs (09/10/2024)  Utilities: Not At Risk (09/10/2024)  Depression (PHQ2-9): Medium Risk (03/26/2020)  Financial Resource Strain: Low Risk  (04/24/2024)   Received from [facility]  Tobacco Use: Medium Risk (07/02/2024)   Received from [facility]    Readmission Risk Interventions     No data to display

## 2024-09-12 NOTE — Lactation Note (Signed)
 This note was copied from a baby's chart. Lactation Consultation Note  Patient Name: Andrea Weiss Date: 09/12/2024 Age:28 hours Reason for consult: Follow-up assessment;Late-preterm 34-36.6wks;Infant < 6lbs;Other (Comment) (Discharge Education)   Maternal Data Follow up assessment w/ a baby and patient.  Mom stated that she felt everything as far as feeding was going well with baby.    Feeding Mother's Current Feeding Choice: Breast Milk and Formula Nipple Type: Dr. Jonna Galli  Mom was able to independently latch infant with no issues.  Infant actively feeding at the breast w/ audible swallows heard.   LATCH Score Latch: Grasps breast easily, tongue down, lips flanged, rhythmical sucking.  Audible Swallowing: Spontaneous and intermittent  Type of Nipple: Everted at rest and after stimulation  Comfort (Breast/Nipple): Soft / non-tender  Hold (Positioning): No assistance needed to correctly position infant at breast.  LATCH Score: 10  Interventions Interventions: Breast feeding basics reviewed;Education;CDC milk storage guidelines;LPT handout/interventions  LC reviewed the importance of LPI behavior and the triple feeding.  Encouraged mom to reach out for an outpatient appointment to do a pre/post weight.  Discussed different ways of healing nipples; lanolin, coconut oil, moms EBM.  Discharge Discharge Education: Engorgement and breast care;Warning signs for feeding baby;Outpatient recommendation  Education on engorgement prevention/treatment was discussed as well as breastmilk storage guidelines.  LC provided patient with a handout on breastmilk storage guidelines from [facility]. [facility] outpatient lactation services phone number written on the white board in the room.  Patient verbalized understanding.  LC also provided education from the postpartum book about warning signs to look for in a poor feeding.    Consult Status Consult Status: Complete Follow-up  type: Call as needed    Ricky RAMAN Aureliano Oshields 09/12/2024, 11:31 AM

## 2024-10-02 ENCOUNTER — Inpatient Hospital Stay: Admit: 2024-10-02 | Admitting: Obstetrics and Gynecology

## 2024-10-02 SURGERY — Surgical Case
Anesthesia: Spinal | Site: Abdomen
